# Patient Record
Sex: Female | Born: 1955 | Race: White | Hispanic: No | Smoking: Never smoker
Health system: Southern US, Community
[De-identification: ages and names within clinical notes are randomized; demographics above are authoritative.]

## PROBLEM LIST (undated history)

## (undated) DIAGNOSIS — L2089 Other atopic dermatitis: Secondary | ICD-10-CM

## (undated) DIAGNOSIS — A64 Unspecified sexually transmitted disease: Secondary | ICD-10-CM

## (undated) DIAGNOSIS — D126 Benign neoplasm of colon, unspecified: Secondary | ICD-10-CM

## (undated) DIAGNOSIS — T7840XA Allergy, unspecified, initial encounter: Secondary | ICD-10-CM

## (undated) DIAGNOSIS — E785 Hyperlipidemia, unspecified: Secondary | ICD-10-CM

## (undated) DIAGNOSIS — F32A Depression, unspecified: Secondary | ICD-10-CM

## (undated) DIAGNOSIS — R51 Headache: Secondary | ICD-10-CM

## (undated) HISTORY — PX: WISDOM TOOTH EXTRACTION: SHX21

## (undated) HISTORY — DX: Headache: R51

## (undated) HISTORY — DX: Hyperlipidemia, unspecified: E78.5

## (undated) HISTORY — DX: Other atopic dermatitis: L20.89

## (undated) HISTORY — DX: Benign neoplasm of colon, unspecified: D12.6

## (undated) HISTORY — DX: Unspecified sexually transmitted disease: A64

## (undated) HISTORY — DX: Depression, unspecified: F32.A

## (undated) HISTORY — DX: Allergy, unspecified, initial encounter: T78.40XA

## (undated) HISTORY — PX: EYE SURGERY: SHX253

---

## 1987-01-16 DIAGNOSIS — A64 Unspecified sexually transmitted disease: Secondary | ICD-10-CM

## 1987-01-16 HISTORY — DX: Unspecified sexually transmitted disease: A64

## 1997-06-24 DIAGNOSIS — D229 Melanocytic nevi, unspecified: Secondary | ICD-10-CM

## 1997-06-24 HISTORY — DX: Melanocytic nevi, unspecified: D22.9

## 1997-07-12 ENCOUNTER — Other Ambulatory Visit: Admission: RE | Admit: 1997-07-12 | Discharge: 1997-07-12 | Payer: Self-pay | Admitting: Obstetrics and Gynecology

## 1998-07-13 ENCOUNTER — Other Ambulatory Visit: Admission: RE | Admit: 1998-07-13 | Discharge: 1998-07-13 | Payer: Self-pay | Admitting: Obstetrics and Gynecology

## 1999-07-27 ENCOUNTER — Other Ambulatory Visit: Admission: RE | Admit: 1999-07-27 | Discharge: 1999-07-27 | Payer: Self-pay | Admitting: Obstetrics and Gynecology

## 2000-09-02 ENCOUNTER — Other Ambulatory Visit: Admission: RE | Admit: 2000-09-02 | Discharge: 2000-09-02 | Payer: Self-pay | Admitting: Obstetrics and Gynecology

## 2001-09-11 ENCOUNTER — Ambulatory Visit (HOSPITAL_COMMUNITY): Admission: RE | Admit: 2001-09-11 | Discharge: 2001-09-11 | Payer: Self-pay | Admitting: Obstetrics and Gynecology

## 2001-09-11 ENCOUNTER — Other Ambulatory Visit: Admission: RE | Admit: 2001-09-11 | Discharge: 2001-09-11 | Payer: Self-pay | Admitting: Obstetrics and Gynecology

## 2001-09-11 ENCOUNTER — Encounter: Payer: Self-pay | Admitting: Obstetrics and Gynecology

## 2002-09-18 ENCOUNTER — Ambulatory Visit (HOSPITAL_COMMUNITY): Admission: RE | Admit: 2002-09-18 | Discharge: 2002-09-18 | Payer: Self-pay | Admitting: Obstetrics and Gynecology

## 2002-09-18 ENCOUNTER — Encounter: Payer: Self-pay | Admitting: Obstetrics and Gynecology

## 2002-09-18 ENCOUNTER — Other Ambulatory Visit: Admission: RE | Admit: 2002-09-18 | Discharge: 2002-09-18 | Payer: Self-pay | Admitting: Obstetrics and Gynecology

## 2003-10-13 ENCOUNTER — Ambulatory Visit (HOSPITAL_COMMUNITY): Admission: RE | Admit: 2003-10-13 | Discharge: 2003-10-13 | Payer: Self-pay | Admitting: *Deleted

## 2003-10-13 ENCOUNTER — Other Ambulatory Visit: Admission: RE | Admit: 2003-10-13 | Discharge: 2003-10-13 | Payer: Self-pay | Admitting: Obstetrics and Gynecology

## 2004-10-25 ENCOUNTER — Ambulatory Visit (HOSPITAL_COMMUNITY): Admission: RE | Admit: 2004-10-25 | Discharge: 2004-10-25 | Payer: Self-pay | Admitting: Obstetrics and Gynecology

## 2004-10-25 ENCOUNTER — Other Ambulatory Visit: Admission: RE | Admit: 2004-10-25 | Discharge: 2004-10-25 | Payer: Self-pay | Admitting: Obstetrics and Gynecology

## 2005-11-06 ENCOUNTER — Ambulatory Visit (HOSPITAL_COMMUNITY): Admission: RE | Admit: 2005-11-06 | Discharge: 2005-11-06 | Payer: Self-pay | Admitting: Obstetrics and Gynecology

## 2005-11-06 ENCOUNTER — Other Ambulatory Visit: Admission: RE | Admit: 2005-11-06 | Discharge: 2005-11-06 | Payer: Self-pay | Admitting: Obstetrics & Gynecology

## 2006-11-11 ENCOUNTER — Other Ambulatory Visit: Admission: RE | Admit: 2006-11-11 | Discharge: 2006-11-11 | Payer: Self-pay | Admitting: Obstetrics and Gynecology

## 2006-11-11 ENCOUNTER — Ambulatory Visit (HOSPITAL_COMMUNITY): Admission: RE | Admit: 2006-11-11 | Discharge: 2006-11-11 | Payer: Self-pay | Admitting: Obstetrics & Gynecology

## 2007-11-13 ENCOUNTER — Ambulatory Visit (HOSPITAL_COMMUNITY): Admission: RE | Admit: 2007-11-13 | Discharge: 2007-11-13 | Payer: Self-pay | Admitting: Obstetrics and Gynecology

## 2007-11-13 ENCOUNTER — Other Ambulatory Visit: Admission: RE | Admit: 2007-11-13 | Discharge: 2007-11-13 | Payer: Self-pay | Admitting: Obstetrics and Gynecology

## 2007-11-26 ENCOUNTER — Encounter: Admission: RE | Admit: 2007-11-26 | Discharge: 2007-11-26 | Payer: Self-pay | Admitting: Obstetrics and Gynecology

## 2008-04-15 DIAGNOSIS — D126 Benign neoplasm of colon, unspecified: Secondary | ICD-10-CM

## 2008-04-15 HISTORY — DX: Benign neoplasm of colon, unspecified: D12.6

## 2008-11-15 ENCOUNTER — Ambulatory Visit (HOSPITAL_COMMUNITY): Admission: RE | Admit: 2008-11-15 | Discharge: 2008-11-15 | Payer: Self-pay | Admitting: Obstetrics and Gynecology

## 2008-12-15 LAB — HM COLONOSCOPY

## 2009-11-16 ENCOUNTER — Ambulatory Visit (HOSPITAL_COMMUNITY): Admission: RE | Admit: 2009-11-16 | Discharge: 2009-11-16 | Payer: Self-pay | Admitting: Obstetrics and Gynecology

## 2009-11-25 LAB — HM MAMMOGRAPHY

## 2009-11-25 LAB — HM PAP SMEAR

## 2010-02-05 ENCOUNTER — Encounter: Payer: Self-pay | Admitting: Obstetrics and Gynecology

## 2010-03-08 LAB — FECAL OCCULT BLOOD, GUAIAC: Fecal Occult Blood: NEGATIVE

## 2010-04-12 ENCOUNTER — Encounter: Payer: Self-pay | Admitting: Family Medicine

## 2010-04-12 DIAGNOSIS — L2089 Other atopic dermatitis: Secondary | ICD-10-CM | POA: Insufficient documentation

## 2010-04-12 DIAGNOSIS — R51 Headache: Secondary | ICD-10-CM | POA: Insufficient documentation

## 2010-04-12 DIAGNOSIS — E785 Hyperlipidemia, unspecified: Secondary | ICD-10-CM | POA: Insufficient documentation

## 2010-04-12 DIAGNOSIS — R519 Headache, unspecified: Secondary | ICD-10-CM | POA: Insufficient documentation

## 2010-04-12 DIAGNOSIS — D126 Benign neoplasm of colon, unspecified: Secondary | ICD-10-CM | POA: Insufficient documentation

## 2010-04-12 DIAGNOSIS — F3130 Bipolar disorder, current episode depressed, mild or moderate severity, unspecified: Secondary | ICD-10-CM | POA: Insufficient documentation

## 2010-10-10 ENCOUNTER — Other Ambulatory Visit: Payer: Self-pay | Admitting: Obstetrics and Gynecology

## 2010-10-10 DIAGNOSIS — Z1231 Encounter for screening mammogram for malignant neoplasm of breast: Secondary | ICD-10-CM

## 2010-11-20 ENCOUNTER — Ambulatory Visit (HOSPITAL_COMMUNITY)
Admission: RE | Admit: 2010-11-20 | Discharge: 2010-11-20 | Disposition: A | Discharge status: Home / Self Care | Payer: BC Managed Care – PPO | Source: Ambulatory Visit | Attending: Obstetrics and Gynecology | Admitting: Obstetrics and Gynecology

## 2010-11-20 DIAGNOSIS — Z1231 Encounter for screening mammogram for malignant neoplasm of breast: Secondary | ICD-10-CM

## 2011-10-23 ENCOUNTER — Other Ambulatory Visit: Payer: Self-pay | Admitting: Nurse Practitioner

## 2011-10-23 DIAGNOSIS — Z1231 Encounter for screening mammogram for malignant neoplasm of breast: Secondary | ICD-10-CM

## 2011-11-21 ENCOUNTER — Ambulatory Visit (HOSPITAL_COMMUNITY)
Admission: RE | Admit: 2011-11-21 | Discharge: 2011-11-21 | Disposition: A | Discharge status: Home / Self Care | Payer: BC Managed Care – PPO | Source: Ambulatory Visit | Attending: Nurse Practitioner | Admitting: Nurse Practitioner

## 2011-11-21 DIAGNOSIS — Z1231 Encounter for screening mammogram for malignant neoplasm of breast: Secondary | ICD-10-CM | POA: Insufficient documentation

## 2012-06-10 ENCOUNTER — Encounter: Payer: Self-pay | Admitting: *Deleted

## 2012-06-27 ENCOUNTER — Other Ambulatory Visit: Payer: Self-pay

## 2012-07-02 ENCOUNTER — Ambulatory Visit: Payer: Self-pay | Admitting: Family Medicine

## 2012-07-10 ENCOUNTER — Other Ambulatory Visit (INDEPENDENT_AMBULATORY_CARE_PROVIDER_SITE_OTHER): Payer: BC Managed Care – PPO

## 2012-07-10 DIAGNOSIS — Z79899 Other long term (current) drug therapy: Secondary | ICD-10-CM

## 2012-07-10 DIAGNOSIS — R5383 Other fatigue: Secondary | ICD-10-CM

## 2012-07-10 DIAGNOSIS — R5381 Other malaise: Secondary | ICD-10-CM

## 2012-07-10 DIAGNOSIS — I10 Essential (primary) hypertension: Secondary | ICD-10-CM

## 2012-07-10 DIAGNOSIS — E559 Vitamin D deficiency, unspecified: Secondary | ICD-10-CM

## 2012-07-10 DIAGNOSIS — E039 Hypothyroidism, unspecified: Secondary | ICD-10-CM

## 2012-07-10 DIAGNOSIS — E785 Hyperlipidemia, unspecified: Secondary | ICD-10-CM

## 2012-07-10 LAB — HEPATIC FUNCTION PANEL
ALT: 14 U/L (ref 0–35)
AST: 14 U/L (ref 0–37)
Albumin: 4.2 g/dL (ref 3.5–5.2)
Alkaline Phosphatase: 60 U/L (ref 39–117)
Bilirubin, Direct: 0.1 mg/dL (ref 0.0–0.3)
Indirect Bilirubin: 0.5 mg/dL (ref 0.0–0.9)
Total Bilirubin: 0.6 mg/dL (ref 0.3–1.2)
Total Protein: 6.7 g/dL (ref 6.0–8.3)

## 2012-07-10 LAB — THYROID PANEL WITH TSH
Free Thyroxine Index: 2.8 (ref 1.0–3.9)
T3 Uptake: 33.8 % (ref 22.5–37.0)
T4, Total: 8.3 ug/dL (ref 5.0–12.5)
TSH: 2.024 u[IU]/mL (ref 0.350–4.500)

## 2012-07-10 LAB — BASIC METABOLIC PANEL WITH GFR
BUN: 8 mg/dL (ref 6–23)
CO2: 25 mEq/L (ref 19–32)
Calcium: 10 mg/dL (ref 8.4–10.5)
Chloride: 108 mEq/L (ref 96–112)
Creat: 0.76 mg/dL (ref 0.50–1.10)
GFR, Est African American: >89 mL/min
GFR, Est Non African American: 89 mL/min
Potassium: 4.3 mEq/L (ref 3.5–5.3)

## 2012-07-10 LAB — POCT CBC
Granulocyte percent: 73.6 %G (ref 37–80)
HCT, POC: 35.5 % — AB (ref 37.7–47.9)
Hemoglobin: 12.7 g/dL (ref 12.2–16.2)
Lymph, poc: 1.3 (ref 0.6–3.4)
MCV: 86 fL (ref 80–97)
MPV: 6.9 fL (ref 0–99.8)
POC Granulocyte: 4 (ref 2–6.9)
Platelet Count, POC: 227 10*3/uL (ref 142–424)
RBC: 4.1 M/uL (ref 4.04–5.48)

## 2012-07-10 NOTE — Progress Notes (Unsigned)
Patient came in for labs only.

## 2012-07-11 LAB — NMR LIPOPROFILE WITH LIPIDS
Cholesterol, Total: 188 mg/dL (ref <200)
HDL Particle Number: 36.1 umol/L (ref >=30.5)
HDL Size: 9 nm — ABNORMAL LOW (ref >=9.2)
HDL-C: 53 mg/dL (ref >=40)
LDL (calc): 108 mg/dL — ABNORMAL HIGH (ref <100)
LDL Particle Number: 1500 nmol/L — ABNORMAL HIGH (ref <1000)
LDL Size: 20.2 nm — ABNORMAL LOW (ref >20.5)
LP-IR Score: 30 (ref <=45)
Large HDL-P: 6.8 umol/L (ref >=4.8)
Large VLDL-P: <0.8 nmol/L (ref <=2.7)
Small LDL Particle Number: 863 nmol/L — ABNORMAL HIGH (ref <=527)
Triglycerides: 137 mg/dL (ref <150)
VLDL Size: 42.8 nm (ref <=46.6)

## 2012-07-11 LAB — VITAMIN D 25 HYDROXY (VIT D DEFICIENCY, FRACTURES): Vit D, 25-Hydroxy: 33 ng/mL (ref 30–89)

## 2012-07-11 LAB — LITHIUM LEVEL: Lithium Lvl: 0.8 mEq/L (ref 0.80–1.40)

## 2012-07-15 ENCOUNTER — Ambulatory Visit (INDEPENDENT_AMBULATORY_CARE_PROVIDER_SITE_OTHER): Payer: BC Managed Care – PPO | Admitting: Family Medicine

## 2012-07-15 ENCOUNTER — Encounter: Payer: Self-pay | Admitting: Family Medicine

## 2012-07-15 VITALS — BP 104/65 | HR 76 | Temp 97.1°F | Ht 62.5 in | Wt 137.4 lb

## 2012-07-15 DIAGNOSIS — N390 Urinary tract infection, site not specified: Secondary | ICD-10-CM

## 2012-07-15 DIAGNOSIS — Z79899 Other long term (current) drug therapy: Secondary | ICD-10-CM

## 2012-07-15 DIAGNOSIS — R51 Headache: Secondary | ICD-10-CM

## 2012-07-15 DIAGNOSIS — E785 Hyperlipidemia, unspecified: Secondary | ICD-10-CM

## 2012-07-15 DIAGNOSIS — F3130 Bipolar disorder, current episode depressed, mild or moderate severity, unspecified: Secondary | ICD-10-CM

## 2012-07-15 LAB — POCT UA - MICROSCOPIC ONLY
Casts, Ur, LPF, POC: NEGATIVE
Crystals, Ur, HPF, POC: NEGATIVE
Mucus, UA: NEGATIVE
Yeast, UA: NEGATIVE

## 2012-07-15 LAB — POCT URINALYSIS DIPSTICK
Bilirubin, UA: NEGATIVE
Glucose, UA: NEGATIVE
Ketones, UA: NEGATIVE
Leukocytes, UA: NEGATIVE
Nitrite, UA: NEGATIVE
Spec Grav, UA: <=1.005
Urobilinogen, UA: NEGATIVE
pH, UA: 5

## 2012-07-15 MED ORDER — IBUPROFEN 800 MG PO TABS: 800.0000 mg | ORAL_TABLET | Freq: Three times a day (TID) | ORAL | Status: DC | PRN

## 2012-07-15 MED ORDER — LITHIUM CARBONATE 300 MG PO CAPS: 300.0000 mg | ORAL_CAPSULE | Freq: Three times a day (TID) | ORAL | Status: DC

## 2012-07-15 NOTE — Addendum Note (Signed)
Addended by: Ernestina Penna on: 07/15/2012 04:54 PM   Modules accepted: Orders

## 2012-07-15 NOTE — Addendum Note (Signed)
Addended by: Orma Render F on: 07/15/2012 05:15 PM   Modules accepted: Orders

## 2012-07-15 NOTE — Patient Instructions (Addendum)
Schedule appointment with clinical pharmacist Continue current meds and therapeutic lifestyle changes We will arrange an appointment with a clinical pharmacist to discuss better lipid control

## 2012-07-15 NOTE — Progress Notes (Signed)
  Subjective:    Patient ID: [name], female    DOB: 12-26-1955, 57 y.o.   MRN: [id number]  HPI Patient returns to clinic today for followup of multiple medical problems. These include hyperlipidemia bipolar disease and osteopenia. She still complains of some right shoulder pain with abduction of the shoulder. She is doing well with her bipolar disease. Labs were reviewed with her the only thing that was outlined was her total LDL particle number was elevated. 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   Review of Systems  Constitutional: Negative.   HENT: Positive for postnasal drip (intermitent). Negative for congestion.   Eyes: Negative.   Respiratory: Negative.   Cardiovascular: Negative.   Gastrointestinal: Positive for constipation (occasional).  Endocrine: Negative.   Genitourinary: Positive for urgency (occasional) and frequency (at night).  Musculoskeletal: Positive for arthralgias (r shoulder stiffness).  Skin: Negative.   Allergic/Immunologic: Positive for environmental allergies (seasonal).  Neurological: Negative.   Psychiatric/Behavioral: Positive for sleep disturbance (occasional).   Recent labs reviewed with patient. Lithium level was good. Cholesterol was elevated.     Objective:   Physical Exam BP 104/65  Pulse 76  Temp(Src) 97.1 F (36.2 C) (Oral)  Ht 5' 2.5" (1.588 m)  Wt 137 lb 6.4 oz (62.324 kg)  BMI 24.71 kg/m2  The patient appeared well nourished and normally developed, alert and oriented to time and place. Speech, behavior and judgement appear normal. Vital signs as documented.  Head exam is unremarkable. No scleral icterus or pallor noted. Ears nose and throat all within normal limits. Neck is without jugular venous distension, thyromegally, or carotid bruits. Carotid upstrokes are brisk bilaterally. No cervical adenopathy. Lungs are clear anteriorly and posteriorly to auscultation. Normal respiratory  effort. Cardiac exam reveals regular rate and rhythm at 72 per minute. First and second heart sounds normal.  No murmurs, rubs or gallops.  Abdominal exam reveals normal bowl sounds, no masses, no organomegaly and no aortic enlargement. No inguinal adenopathy. Extremities are nonedematous and both femoral and pedal pulses are normal. Skin without pallor or jaundice.  Warm and dry, without rash. Facial erythema on the left cheek. Neurologic exam reveals normal deep tendon reflexes and normal sensation.          Assessment & Plan:  1. Hyperlipidemia  2. Bipolar I disorder, most recent episode (or current) depressed  3. High risk medication use  Patient Instructions  Schedule appointment with clinical pharmacist Continue current meds and therapeutic lifestyle changes We will arrange an appointment with a clinical pharmacist to discuss better lipid control   Nyra Capes MD

## 2012-07-17 ENCOUNTER — Telehealth: Payer: Self-pay | Admitting: *Deleted

## 2012-07-17 NOTE — Telephone Encounter (Signed)
Message copied by Bearl Mulberry on Thu Jul 17, 2012  5:32 PM ------      Message from: Ernestina Penna      Created: Tue Jul 15, 2012  6:21 PM       The urinalysis only has 1-5 WBCs and this is within normal limits.      Drink plenty of fluids ------

## 2012-07-17 NOTE — Telephone Encounter (Signed)
Pt notified of results

## 2012-08-12 ENCOUNTER — Ambulatory Visit (INDEPENDENT_AMBULATORY_CARE_PROVIDER_SITE_OTHER): Payer: BC Managed Care – PPO | Admitting: Pharmacist Clinician (PhC)/ Clinical Pharmacy Specialist

## 2012-08-12 DIAGNOSIS — E785 Hyperlipidemia, unspecified: Secondary | ICD-10-CM

## 2012-08-12 NOTE — Progress Notes (Signed)
  Subjective:    Patient ID: [name], female    DOB: 05/05/1955, 58 y.o.   MRN: [id number]  HPI:  Newly Elevated LDL-P level with otherwise normal lipids.  Father had a triple bypass at age 88 and has elevated cholesterol levels and borderline diabetes.  Mother has no history of CAD.      Review of Systems  Constitutional: Negative.   Respiratory: Negative.   Cardiovascular: Negative.   Musculoskeletal: Negative.   Neurological: Negative.   Psychiatric/Behavioral: Negative.        Objective:   Physical Exam  Constitutional: She is oriented to person, place, and time. She appears well-developed and well-nourished.  Cardiovascular: Normal rate and regular rhythm.   Neurological: She is alert and oriented to person, place, and time.  Psychiatric: She has a normal mood and affect. Her behavior is normal. Judgment and thought content normal.          Assessment & Plan:   Lipid Clinic Consultation  Chief Complaint:  No chief complaint on file.    Exam Regularity:  RRR Edema:  neg Respirations:  20   Carotid Bruits:  neg Xanthomas:  neg General Appearance:  alert, oriented, no acute distress Mood/Affect:  normal  HPI:  Elevated LDL-P new onset with otherwise normal lipids and no treatments for elevated cholesterol past of present.       Component Value Date/Time   TRIG 137 07/10/2012 0842    Assessment: CHD/CHF Risk Equivalents:  family history and age Framingham Estd 5yrs risk:   NCEP Risk Factors Present:  family history and age Primary Problem(s):  LDL or LDL-P elevated  Current NCEP Goals: LDL Goal < 130 HDL Goal >/= 40 Tg Goal < 782 Non-HDL Goal < 160  Secondary cause of hyperlipidemia present:  None present Low fat diet followed?  No - diet high in saturated fat and processed foods Low carb diet followed?  No -   Exercise?  No - walks dog leisurely 15 minutes a day  Recommendations: Changes in lipid medication(s):  Counseled patient extensively  on dietary changes to make for a heart healthy diet.  She will start walking daily at a brisk pace 30-45 minutes a day is her goal. Recheck Lipid Panel:  3 months Other labs needed:  TSH and blood glucose  Time spent counseling patient:  39  Physician time spent with patient:  0 Referring Provider:  Christell Constant   PharmD:  Chari Manning, [facility]

## 2012-10-30 ENCOUNTER — Other Ambulatory Visit: Payer: Self-pay | Admitting: Nurse Practitioner

## 2012-10-30 DIAGNOSIS — Z1231 Encounter for screening mammogram for malignant neoplasm of breast: Secondary | ICD-10-CM

## 2012-11-20 ENCOUNTER — Encounter: Payer: Self-pay | Admitting: Nurse Practitioner

## 2012-11-21 ENCOUNTER — Ambulatory Visit (INDEPENDENT_AMBULATORY_CARE_PROVIDER_SITE_OTHER): Payer: BC Managed Care – PPO | Admitting: Nurse Practitioner

## 2012-11-21 ENCOUNTER — Ambulatory Visit (HOSPITAL_COMMUNITY)
Admission: RE | Admit: 2012-11-21 | Discharge: 2012-11-21 | Disposition: A | Discharge status: Home / Self Care | Payer: BC Managed Care – PPO | Source: Ambulatory Visit | Attending: Nurse Practitioner | Admitting: Nurse Practitioner

## 2012-11-21 ENCOUNTER — Encounter: Payer: Self-pay | Admitting: Nurse Practitioner

## 2012-11-21 VITALS — BP 120/72 | HR 68 | Resp 16 | Ht 63.0 in | Wt 134.0 lb

## 2012-11-21 DIAGNOSIS — Z0000 Encounter for general adult medical examination without abnormal findings: Secondary | ICD-10-CM

## 2012-11-21 DIAGNOSIS — Z1231 Encounter for screening mammogram for malignant neoplasm of breast: Secondary | ICD-10-CM

## 2012-11-21 DIAGNOSIS — Z01419 Encounter for gynecological examination (general) (routine) without abnormal findings: Secondary | ICD-10-CM

## 2012-11-21 LAB — POCT URINALYSIS DIPSTICK
Bilirubin, UA: NEGATIVE
Blood, UA: NEGATIVE
Glucose, UA: NEGATIVE
Ketones, UA: NEGATIVE
Leukocytes, UA: NEGATIVE
Nitrite, UA: NEGATIVE
Protein, UA: NEGATIVE
Urobilinogen, UA: NEGATIVE
pH, UA: 5

## 2012-11-21 MED ORDER — VALACYCLOVIR HCL 1 G PO TABS: 1000.0000 mg | ORAL_TABLET | Freq: Every day | ORAL | Status: DC

## 2012-11-21 NOTE — Progress Notes (Signed)
Patient ID: [name], female   DOB: Jun 30, 1955, 57 y.o.   MRN: [id number] 57 y.o. [id number] Widowed Caucasian Fe here for annual exam.    No LMP recorded. Patient is postmenopausal.          Sexually active: no  The current method of family planning is none.    Exercising: yes  Home exercise routine includes walking 3 times per week. Smoker:  no  Health Maintenance: Pap: 11/21/11, WNL, neg HR HPV MMG: 11/22/11, Bi-Rads 1: negative Colonoscopy: 04/2008, tubular adenoma, repeat 5 years BMD: 10/10/11, osteopenia TDaP: 2012 Labs: PCP recent Vit D at 33 done in June 2014   reports that she has never smoked. She does not have any smokeless tobacco history on file. She reports that she drinks alcohol. She reports that she does not use illicit drugs.  Past Medical History  Diagnosis Date  . Bipolar I disorder, most recent episode (or current) depressed   . Benign neoplasm of colon 04/2008    tubular adenoma  . Headache(784.0)   . Other atopic dermatitis and related conditions   . Other and unspecified hyperlipidemia     Past Surgical History  Procedure Laterality Date  . Cesarean section      x 2    Current Outpatient Prescriptions  Medication Sig Dispense Refill  . CALCIUM PO Take by mouth.      . Cholecalciferol (VITAMIN D) 2000 UNITS CAPS Take by mouth.      Marland Kitchen ibuprofen (ADVIL,MOTRIN) 800 MG tablet Take 1 tablet (800 mg total) by mouth every 8 (eight) hours as needed.  30 tablet  5  . lithium carbonate 300 MG capsule Take 1 capsule (300 mg total) by mouth 3 (three) times daily with meals.  90 capsule  5  . valACYclovir (VALTREX) 1000 MG tablet Take 500 mg by mouth daily.        No current facility-administered medications for this visit.    Family History  Problem Relation Age of Onset  . Colon cancer Maternal Grandmother 60  . Heart disease Father   . Hypertension Father   . Thyroid disease Mother     ROS:  Pertinent items are noted in HPI.  Otherwise, a comprehensive  ROS was negative.  Exam:   BP 120/72  Pulse 68  Resp 16  Ht 5\' 3"  (1.6 m)  Wt 134 lb (60.782 kg)  BMI 23.74 kg/m2 Height: 5\' 3"  (160 cm)  Ht Readings from Last 3 Encounters:  11/21/12 5\' 3"  (1.6 m)  07/15/12 5' 2.5" (1.588 m)    General appearance: alert, cooperative and appears stated age Head: Normocephalic, without obvious abnormality, atraumatic Neck: no adenopathy, supple, symmetrical, trachea midline and thyroid normal to inspection and palpation Lungs: clear to auscultation bilaterally Breasts: normal appearance, no masses or tenderness Heart: regular rate and rhythm Abdomen: soft, non-tender; no masses,  no organomegaly Extremities: extremities normal, atraumatic, no cyanosis or edema Skin: Skin color, texture, turgor normal. No rashes or lesions Lymph nodes: Cervical, supraclavicular, and axillary nodes normal. No abnormal inguinal nodes palpated Neurologic: Grossly normal   Pelvic: External genitalia:  no lesions              Urethra:  normal appearing urethra with no masses, tenderness or lesions              Bartholin's and Skene's: normal                 Vagina: normal appearing vagina with normal color  and discharge, no lesions              Cervix: anteverted              Pap taken: no Bimanual Exam:  Uterus:  normal size, contour, position, consistency, mobility, non-tender              Adnexa: no mass, fullness, tenderness               Rectovaginal: Confirms               Anus:  normal sphincter tone, no lesions  A:  Well Woman with normal exam  Postmenopausal no HRT  Osteopenia  History of HSV 1989  FMH: osteoporosis with mother  P:   Pap smear as per guidelines   Mammogram due now and will schedule  Refill Valtrex for a year  Counseled on breast self exam, adequate intake of calcium and vitamin D, diet and exercise return annually or prn  An After Visit Summary was printed and given to the patient.

## 2012-11-21 NOTE — Patient Instructions (Signed)

## 2012-11-23 NOTE — Progress Notes (Signed)
Encounter reviewed by Dr. Brook Silva.  

## 2013-01-19 ENCOUNTER — Ambulatory Visit: Payer: BC Managed Care – PPO | Admitting: Family Medicine

## 2013-02-27 ENCOUNTER — Other Ambulatory Visit: Payer: Self-pay | Admitting: Family Medicine

## 2013-03-02 NOTE — Telephone Encounter (Signed)
Last seen 07/15/12  DWM

## 2013-03-23 ENCOUNTER — Other Ambulatory Visit: Payer: Self-pay | Admitting: *Deleted

## 2013-03-23 ENCOUNTER — Ambulatory Visit (INDEPENDENT_AMBULATORY_CARE_PROVIDER_SITE_OTHER): Payer: BC Managed Care – PPO | Admitting: Family Medicine

## 2013-03-23 ENCOUNTER — Encounter: Payer: Self-pay | Admitting: Family Medicine

## 2013-03-23 ENCOUNTER — Ambulatory Visit (INDEPENDENT_AMBULATORY_CARE_PROVIDER_SITE_OTHER): Payer: BC Managed Care – PPO

## 2013-03-23 VITALS — BP 110/72 | HR 65 | Temp 98.6°F | Ht 63.0 in | Wt 138.0 lb

## 2013-03-23 DIAGNOSIS — E785 Hyperlipidemia, unspecified: Secondary | ICD-10-CM

## 2013-03-23 DIAGNOSIS — Z0000 Encounter for general adult medical examination without abnormal findings: Secondary | ICD-10-CM

## 2013-03-23 DIAGNOSIS — M19019 Primary osteoarthritis, unspecified shoulder: Secondary | ICD-10-CM

## 2013-03-23 DIAGNOSIS — R51 Headache: Secondary | ICD-10-CM

## 2013-03-23 DIAGNOSIS — F3130 Bipolar disorder, current episode depressed, mild or moderate severity, unspecified: Secondary | ICD-10-CM

## 2013-03-23 DIAGNOSIS — E559 Vitamin D deficiency, unspecified: Secondary | ICD-10-CM

## 2013-03-23 LAB — POCT CBC
Granulocyte percent: 64.5 %G (ref 37–80)
HCT, POC: 39.3 % (ref 37.7–47.9)
Hemoglobin: 12.6 g/dL (ref 12.2–16.2)
LYMPH, POC: 1.7 (ref 0.6–3.4)
MCH, POC: 27.6 pg (ref 27–31.2)
MCHC: 32.1 g/dL (ref 31.8–35.4)
MCV: 85.9 fL (ref 80–97)
MPV: 6.2 fL (ref 0–99.8)
PLATELET COUNT, POC: 270 10*3/uL (ref 142–424)
POC Granulocyte: 3.8 (ref 2–6.9)
POC LYMPH %: 29.4 % (ref 10–50)
RBC: 4.6 M/uL (ref 4.04–5.48)
RDW, POC: 14 %
WBC: 5.9 10*3/uL (ref 4.6–10.2)

## 2013-03-23 MED ORDER — LITHIUM CARBONATE 300 MG PO CAPS: ORAL_CAPSULE | ORAL | Status: DC

## 2013-03-23 MED ORDER — IBUPROFEN 800 MG PO TABS: 800.0000 mg | ORAL_TABLET | Freq: Three times a day (TID) | ORAL | Status: DC | PRN

## 2013-03-23 NOTE — Progress Notes (Signed)
Subjective:    Patient ID: [name], female    DOB: 1955/08/29, 58 y.o.   MRN: [id number]  HPI Pt here for follow up and management of chronic medical problems. The patient comes because of her bipolar disease for which we prescribed the lithium and she also has continued problems with her right a.c. joint area. She had x-rays of this in the past and this revealed degenerative changes of the right a.c. joint. She is due to get a chest x-ray and an FOBT today. She will get her basic lab work today and will return to clinic for a lithium level. She does request that her ibuprofen  refill.        Patient Active Problem List   Diagnosis Date Noted  . Bipolar I disorder, most recent episode (or current) depressed   . Benign neoplasm of colon   . Headache   . Other atopic dermatitis and related conditions   . Hyperlipidemia    Outpatient Encounter Prescriptions as of 03/23/2013  Medication Sig  . CALCIUM PO Take by mouth.  . Cholecalciferol (VITAMIN D) 2000 UNITS CAPS Take by mouth.  Marland Kitchen ibuprofen (ADVIL,MOTRIN) 800 MG tablet Take 1 tablet (800 mg total) by mouth every 8 (eight) hours as needed.  . lithium carbonate 300 MG capsule TAKE ONE CAPSULE BY MOUTH THREE TIMES DAILY WITH MEALS  . valACYclovir (VALTREX) 1000 MG tablet Take 1 tablet (1,000 mg total) by mouth daily.    Review of Systems  Constitutional: Negative.   HENT: Positive for ear pain (right - feels "stopped up") and sinus pressure (worse in the mornings).   Eyes: Negative.   Respiratory: Negative.   Cardiovascular: Negative.   Gastrointestinal: Negative.   Endocrine: Negative.   Genitourinary: Negative.   Musculoskeletal: Positive for arthralgias (right shoulder and right knee pain at times).  Skin: Negative.   Allergic/Immunologic: Negative.   Neurological: Negative.   Hematological: Negative.   Psychiatric/Behavioral: Negative.        Objective:   Physical Exam  Nursing note and vitals  reviewed. Constitutional: She is oriented to person, place, and time. She appears well-developed and well-nourished. No distress.  Calm And pleasant  HENT:  Head: Normocephalic and atraumatic.  Right Ear: External ear normal.  Left Ear: External ear normal.  Nose: Nose normal.  Mouth/Throat: Oropharynx is clear and moist.  Eyes: Conjunctivae and EOM are normal. Pupils are equal, round, and reactive to light. Right eye exhibits no discharge. Left eye exhibits no discharge. No scleral icterus.  Neck: Normal range of motion. Neck supple. No thyromegaly present.  Cardiovascular: Normal rate, regular rhythm, normal heart sounds and intact distal pulses.  Exam reveals no gallop and no friction rub.   No murmur heard. At 72 per minute  Pulmonary/Chest: Effort normal and breath sounds normal. No respiratory distress. She has no wheezes. She has no rales.  Abdominal: Soft. Bowel sounds are normal. She exhibits no mass. There is no tenderness. There is no rebound and no guarding.  Musculoskeletal: Normal range of motion. She exhibits no edema and no tenderness.  Lymphadenopathy:    She has no cervical adenopathy.  Neurological: She is alert and oriented to person, place, and time. She has normal reflexes.  Skin: Skin is warm and dry.  Psychiatric: She has a normal mood and affect. Her behavior is normal. Judgment and thought content normal.  Moods are stable with lithium   BP 110/72  Pulse 65  Temp(Src) 98.6 F (37 C) (Oral)  Ht 5' 3"  (1.6 m)  Wt 138 lb (62.596 kg)  BMI 24.45 kg/m2  [facility] reading (PRIMARY) by  Dr. Laurance Flatten-  no active disease                                      Assessment & Plan:   1. Hyperlipidemia - Hepatic function panel - Lipid panel - DG Chest 2 View; Future  2. Healthcare maintenance - POCT CBC - BMP8+EGFR - Hepatic function panel - Lipid panel - Vit D  25 hydroxy (rtn osteoporosis monitoring) - DG Chest 2 View; Future  3. Vitamin D deficiency - Vit D   25 hydroxy (rtn osteoporosis monitoring)  4. Headache(784.0) - ibuprofen (ADVIL,MOTRIN) 800 MG tablet; Take 1 tablet (800 mg total) by mouth every 8 (eight) hours as needed.  Dispense: 30 tablet; Refill: 5  5. Acromioclavicular joint arthritis - ibuprofen (ADVIL,MOTRIN) 800 MG tablet; Take 1 tablet (800 mg total) by mouth every 8 (eight) hours as needed.  Dispense: 30 tablet; Refill: 5  6. Bipolar I disorder, most recent episode (or current) depressed  Patient Instructions  Continue current medications. Continue good therapeutic lifestyle changes which include good diet and exercise. Fall precautions discussed with patient. If an FOBT was given today- please return it to our front desk. If you are over 44 years old - you may need Prevnar 53 or the adult Pneumonia vaccine.  Take ibuprofen 800 twice daily for 7-10 days after eating Use warm wet compresses to right a.c. joint 20 minutes 3 or 4 times daily Return to clinic during spring break for lithium level. Be sure to check with her insurance regarding the Prevnar vaccine   Arrie Senate MD

## 2013-03-23 NOTE — Patient Instructions (Addendum)
Continue current medications. Continue good therapeutic lifestyle changes which include good diet and exercise. Fall precautions discussed with patient. If an FOBT was given today- please return it to our front desk. If you are over 56 years old - you may need Prevnar 61 or the adult Pneumonia vaccine.  Take ibuprofen 800 twice daily for 7-10 days after eating Use warm wet compresses to right a.c. joint 20 minutes 3 or 4 times daily Return to clinic during spring break for lithium level. Be sure to check with her insurance regarding the Prevnar vaccine

## 2013-03-24 LAB — HEPATIC FUNCTION PANEL
ALBUMIN: 4.5 g/dL (ref 3.5–5.5)
ALT: 10 IU/L (ref 0–32)
AST: 12 IU/L (ref 0–40)
Alkaline Phosphatase: 65 IU/L (ref 39–117)
BILIRUBIN TOTAL: 0.4 mg/dL (ref 0.0–1.2)
Bilirubin, Direct: 0.13 mg/dL (ref 0.00–0.40)
TOTAL PROTEIN: 6.5 g/dL (ref 6.0–8.5)

## 2013-03-24 LAB — LIPID PANEL
Chol/HDL Ratio: 3.6 ratio units (ref 0.0–4.4)
Cholesterol, Total: 192 mg/dL (ref 100–199)
HDL: 54 mg/dL (ref >39)
LDL Calculated: 110 mg/dL — ABNORMAL HIGH (ref 0–99)
Triglycerides: 138 mg/dL (ref 0–149)
VLDL CHOLESTEROL CAL: 28 mg/dL (ref 5–40)

## 2013-03-24 LAB — BMP8+EGFR
BUN/Creatinine Ratio: 15 (ref 9–23)
BUN: 10 mg/dL (ref 6–24)
CALCIUM: 9.7 mg/dL (ref 8.7–10.2)
CO2: 22 mmol/L (ref 18–29)
CREATININE: 0.68 mg/dL (ref 0.57–1.00)
Chloride: 104 mmol/L (ref 97–108)
GFR calc Af Amer: 114 mL/min/{1.73_m2} (ref >59)
GFR calc non Af Amer: 99 mL/min/{1.73_m2} (ref >59)
GLUCOSE: 88 mg/dL (ref 65–99)
Potassium: 4.1 mmol/L (ref 3.5–5.2)
Sodium: 142 mmol/L (ref 134–144)

## 2013-03-24 LAB — VITAMIN D 25 HYDROXY (VIT D DEFICIENCY, FRACTURES): VIT D 25 HYDROXY: 24.7 ng/mL — AB (ref 30.0–100.0)

## 2013-03-27 ENCOUNTER — Other Ambulatory Visit: Payer: BC Managed Care – PPO

## 2013-03-27 DIAGNOSIS — Z1212 Encounter for screening for malignant neoplasm of rectum: Secondary | ICD-10-CM

## 2013-03-27 NOTE — Progress Notes (Signed)
Pt came in for labs only 

## 2013-03-28 LAB — FECAL OCCULT BLOOD, IMMUNOCHEMICAL: Fecal Occult Bld: NEGATIVE

## 2013-04-01 ENCOUNTER — Other Ambulatory Visit: Payer: Self-pay | Admitting: *Deleted

## 2013-04-01 MED ORDER — VITAMIN D (ERGOCALCIFEROL) 1.25 MG (50000 UNIT) PO CAPS: 50000.0000 [IU] | ORAL_CAPSULE | ORAL | Status: DC

## 2013-04-09 ENCOUNTER — Encounter: Payer: Self-pay | Admitting: *Deleted

## 2013-04-09 NOTE — Progress Notes (Signed)
Quick Note:  Copy of labs sent to patient ______ 

## 2013-04-16 ENCOUNTER — Other Ambulatory Visit (INDEPENDENT_AMBULATORY_CARE_PROVIDER_SITE_OTHER): Payer: BC Managed Care – PPO

## 2013-04-16 DIAGNOSIS — F3130 Bipolar disorder, current episode depressed, mild or moderate severity, unspecified: Secondary | ICD-10-CM

## 2013-04-16 NOTE — Progress Notes (Signed)
Patient came in for labs only.

## 2013-04-17 LAB — LITHIUM LEVEL: LITHIUM LVL: 1 mmol/L (ref 0.6–1.4)

## 2013-04-20 ENCOUNTER — Telehealth: Payer: Self-pay | Admitting: Family Medicine

## 2013-04-20 NOTE — Telephone Encounter (Signed)
Message copied by Waverly Ferrari on Mon Apr 20, 2013  3:37 PM ------      Message from: Chipper Herb      Created: Fri Apr 17, 2013  9:01 AM       The. lithium level was within normal limits ------

## 2013-04-20 NOTE — Telephone Encounter (Signed)
Pt aware of lab result.  rs °

## 2013-07-29 ENCOUNTER — Encounter: Payer: Self-pay | Admitting: *Deleted

## 2013-08-11 ENCOUNTER — Ambulatory Visit: Payer: BC Managed Care – PPO | Admitting: Family Medicine

## 2013-08-13 ENCOUNTER — Encounter: Payer: Self-pay | Admitting: Family Medicine

## 2013-08-13 ENCOUNTER — Ambulatory Visit (INDEPENDENT_AMBULATORY_CARE_PROVIDER_SITE_OTHER): Payer: BC Managed Care – PPO | Admitting: Family Medicine

## 2013-08-13 VITALS — BP 112/74 | HR 74 | Temp 98.5°F | Ht 63.0 in | Wt 135.0 lb

## 2013-08-13 DIAGNOSIS — Z23 Encounter for immunization: Secondary | ICD-10-CM

## 2013-08-13 DIAGNOSIS — R350 Frequency of micturition: Secondary | ICD-10-CM

## 2013-08-13 DIAGNOSIS — M25519 Pain in unspecified shoulder: Secondary | ICD-10-CM

## 2013-08-13 DIAGNOSIS — F3130 Bipolar disorder, current episode depressed, mild or moderate severity, unspecified: Secondary | ICD-10-CM

## 2013-08-13 DIAGNOSIS — Z13820 Encounter for screening for osteoporosis: Secondary | ICD-10-CM

## 2013-08-13 DIAGNOSIS — M25511 Pain in right shoulder: Secondary | ICD-10-CM

## 2013-08-13 DIAGNOSIS — G4700 Insomnia, unspecified: Secondary | ICD-10-CM

## 2013-08-13 DIAGNOSIS — E559 Vitamin D deficiency, unspecified: Secondary | ICD-10-CM

## 2013-08-13 DIAGNOSIS — E785 Hyperlipidemia, unspecified: Secondary | ICD-10-CM

## 2013-08-13 LAB — POCT UA - MICROSCOPIC ONLY
Casts, Ur, LPF, POC: NEGATIVE
Crystals, Ur, HPF, POC: NEGATIVE
Mucus, UA: NEGATIVE
YEAST UA: NEGATIVE

## 2013-08-13 LAB — POCT URINALYSIS DIPSTICK
BILIRUBIN UA: NEGATIVE
Glucose, UA: NEGATIVE
KETONES UA: NEGATIVE
NITRITE UA: NEGATIVE
Spec Grav, UA: <=1.005
Urobilinogen, UA: NEGATIVE
pH, UA: 8.5

## 2013-08-13 MED ORDER — CIPROFLOXACIN HCL 500 MG PO TABS: 500.0000 mg | ORAL_TABLET | Freq: Two times a day (BID) | ORAL | Status: DC

## 2013-08-13 MED ORDER — MELOXICAM 15 MG PO TABS: ORAL_TABLET | ORAL | Status: DC

## 2013-08-13 NOTE — Progress Notes (Signed)
Subjective:    Patient ID: [name], female    DOB: 12-01-1955, 58 y.o.   MRN: [id number]  HPI Pt here for follow up and management of chronic medical problems. The patient complains today of some right arm soreness. The discomfort is primarily at the right a.c. joint. She had x-rays a couple of years ago which indicated mild degenerative changes. The patient also complains of some urinary frequency and occasionally problems with going to bed and getting to sleep at nighttime. The patient indicates that her bipolar disorder is well controlled. She also indicates that she had a colonoscopy and one polyp was found earlier in the month. She is scheduled for a repeat colonoscopy in 5 years. She is due to get her Prevnar vaccine today and she will be scheduled for a DEXA scan. She had a recent eye exam that was good.        Patient Active Problem List   Diagnosis Date Noted  . Vitamin D deficiency 03/23/2013  . Bipolar I disorder, most recent episode (or current) depressed   . Benign neoplasm of colon   . Headache   . Other atopic dermatitis and related conditions   . Hyperlipidemia    Outpatient Encounter Prescriptions as of 08/13/2013  Medication Sig  . CALCIUM PO Take by mouth.  . Cholecalciferol (VITAMIN D) 2000 UNITS CAPS Take by mouth.  Marland Kitchen ibuprofen (ADVIL,MOTRIN) 800 MG tablet Take 1 tablet (800 mg total) by mouth every 8 (eight) hours as needed.  . lithium carbonate 300 MG capsule TAKE ONE CAPSULE BY MOUTH THREE TIMES DAILY WITH MEALS  . valACYclovir (VALTREX) 1000 MG tablet Take 1 tablet (1,000 mg total) by mouth daily.  . [DISCONTINUED] Vitamin D, Ergocalciferol, (DRISDOL) 50000 UNITS CAPS capsule Take 1 capsule (50,000 Units total) by mouth every 7 (seven) days.    Review of Systems  Constitutional: Negative.   HENT: Negative.   Eyes: Negative.   Respiratory: Negative.   Cardiovascular: Negative.   Gastrointestinal: Negative.   Endocrine: Negative.   Genitourinary:  Negative.   Musculoskeletal: Positive for arthralgias (right arm soreness).  Skin: Negative.   Allergic/Immunologic: Negative.   Neurological: Negative.   Hematological: Negative.   Psychiatric/Behavioral: Negative.        Objective:   Physical Exam  Nursing note and vitals reviewed. Constitutional: She is oriented to person, place, and time. She appears well-developed and well-nourished. No distress.  HENT:  Head: Normocephalic and atraumatic.  Right Ear: External ear normal.  Left Ear: External ear normal.  Mouth/Throat: Oropharynx is clear and moist.  Nasal congestion  Eyes: Conjunctivae and EOM are normal. Pupils are equal, round, and reactive to light. Right eye exhibits no discharge. Left eye exhibits no discharge. No scleral icterus.  Neck: Normal range of motion. Neck supple. No JVD present. No thyromegaly present.  Cardiovascular: Normal rate, regular rhythm, normal heart sounds and intact distal pulses.   No murmur heard. Regular rate and rhythm at 72 per minute  Pulmonary/Chest: Effort normal and breath sounds normal. No respiratory distress. She has no wheezes. She has no rales. She exhibits no tenderness.  Abdominal: Soft. Bowel sounds are normal. She exhibits no mass. There is no tenderness. There is no rebound and no guarding.  Note epigastric or suprapubic tenderness  Musculoskeletal: Normal range of motion. She exhibits no edema and no tenderness.  Lymphadenopathy:    She has no cervical adenopathy.  Neurological: She is alert and oriented to person, place, and time. She has  normal reflexes. No cranial nerve deficit.  Skin: Skin is warm and dry. No rash noted.  Psychiatric: She has a normal mood and affect. Her behavior is normal. Judgment and thought content normal.   BP 112/74  Pulse 74  Temp(Src) 98.5 F (36.9 C) (Oral)  Ht 5' 3"  (1.6 m)  Wt 135 lb (61.236 kg)  BMI 23.92 kg/m2          Assessment & Plan:  1. Bipolar I disorder, most recent episode  (or current) depressed - POCT CBC - BMP8+EGFR - Hepatic function panel - Lithium level  2. Hyperlipidemia - POCT CBC - Hepatic function panel - NMR, lipoprofile  3. Vitamin D deficiency - POCT CBC - Vit D  25 hydroxy (rtn osteoporosis monitoring)  4. Urine frequency - POCT UA - Microscopic Only - POCT urinalysis dipstick  5. Screening for osteoporosis - DG Bone Density; Future  6. Right shoulder pain - meloxicam (MOBIC) 15 MG tablet; One half pill daily as directed after eating  Dispense: 30 tablet; Refill: 0  7. Insomnia  Patient Instructions  Continue current medications. Continue good therapeutic lifestyle changes which include good diet and exercise. Fall precautions discussed with patient. If an FOBT was given today- please return it to our front desk. If you are over 51 years old - you may need Prevnar 66 or the adult Pneumonia vaccine.   The Prevnar vaccine that you received today made make your arm sore We will call you with the results of your lab work once those results are available You will be scheduled for a DEXA scan    Arrie Senate MD

## 2013-08-13 NOTE — Patient Instructions (Addendum)
Continue current medications. Continue good therapeutic lifestyle changes which include good diet and exercise. Fall precautions discussed with patient. If an FOBT was given today- please return it to our front desk. If you are over 56 years old - you may need Prevnar 104 or the adult Pneumonia vaccine.   The Prevnar vaccine that you received today made make your arm sore We will call you with the results of your lab work once those results are available You will be scheduled for a DEXA scan

## 2013-08-14 LAB — VITAMIN D 25 HYDROXY (VIT D DEFICIENCY, FRACTURES): Vit D, 25-Hydroxy: 47.2 ng/mL (ref 30.0–100.0)

## 2013-08-14 LAB — NMR, LIPOPROFILE
CHOLESTEROL: 187 mg/dL (ref 100–199)
HDL CHOLESTEROL BY NMR: 50 mg/dL (ref >39)
HDL Particle Number: 31.8 umol/L (ref >=30.5)
LDL PARTICLE NUMBER: 1302 nmol/L — AB (ref <1000)
LDL SIZE: 20.5 nm (ref >20.5)
LDLC SERPL CALC-MCNC: 106 mg/dL — ABNORMAL HIGH (ref 0–99)
LP-IR Score: 27 (ref <=45)
Small LDL Particle Number: 711 nmol/L — ABNORMAL HIGH (ref <=527)
Triglycerides by NMR: 153 mg/dL — ABNORMAL HIGH (ref 0–149)

## 2013-08-14 LAB — BMP8+EGFR
BUN/Creatinine Ratio: 12 (ref 9–23)
BUN: 9 mg/dL (ref 6–24)
CO2: 24 mmol/L (ref 18–29)
CREATININE: 0.76 mg/dL (ref 0.57–1.00)
Calcium: 10.1 mg/dL (ref 8.7–10.2)
Chloride: 103 mmol/L (ref 97–108)
GFR calc Af Amer: 101 mL/min/{1.73_m2} (ref >59)
GFR, EST NON AFRICAN AMERICAN: 88 mL/min/{1.73_m2} (ref >59)
GLUCOSE: 80 mg/dL (ref 65–99)
POTASSIUM: 4.4 mmol/L (ref 3.5–5.2)
SODIUM: 142 mmol/L (ref 134–144)

## 2013-08-14 LAB — CBC WITH DIFFERENTIAL
Basophils Absolute: 0 10*3/uL (ref 0.0–0.2)
Basos: 0 %
Eos: 3 %
Eosinophils Absolute: 0.2 10*3/uL (ref 0.0–0.4)
HCT: 40.7 % (ref 34.0–46.6)
Hemoglobin: 13.7 g/dL (ref 11.1–15.9)
Immature Grans (Abs): 0 10*3/uL (ref 0.0–0.1)
Immature Granulocytes: 0 %
LYMPHS ABS: 1.4 10*3/uL (ref 0.7–3.1)
Lymphs: 24 %
MCH: 28.8 pg (ref 26.6–33.0)
MCHC: 33.7 g/dL (ref 31.5–35.7)
MCV: 86 fL (ref 79–97)
MONOS ABS: 0.3 10*3/uL (ref 0.1–0.9)
Monocytes: 4 %
NEUTROS PCT: 69 %
Neutrophils Absolute: 4.1 10*3/uL (ref 1.4–7.0)
Platelets: 245 10*3/uL (ref 150–379)
RBC: 4.75 x10E6/uL (ref 3.77–5.28)
RDW: 14.7 % (ref 12.3–15.4)
WBC: 6 10*3/uL (ref 3.4–10.8)

## 2013-08-14 LAB — HEPATIC FUNCTION PANEL
ALT: 8 IU/L (ref 0–32)
AST: 12 IU/L (ref 0–40)
Albumin: 4.3 g/dL (ref 3.5–5.5)
Alkaline Phosphatase: 64 IU/L (ref 39–117)
BILIRUBIN DIRECT: 0.15 mg/dL (ref 0.00–0.40)
BILIRUBIN TOTAL: 0.7 mg/dL (ref 0.0–1.2)
TOTAL PROTEIN: 6.6 g/dL (ref 6.0–8.5)

## 2013-08-14 LAB — LITHIUM LEVEL: Lithium Lvl: 1.1 mmol/L (ref 0.6–1.4)

## 2013-08-15 LAB — URINE CULTURE

## 2013-09-28 ENCOUNTER — Telehealth: Payer: Self-pay | Admitting: Nurse Practitioner

## 2013-09-28 NOTE — Telephone Encounter (Signed)
Pt states she is having itching/burning on the top of vagina said that she was meloxicam (MOBIC) 15 MG tablet and read that it causes itching but isnt sure what to do. Offered appt for yeast inf but pt denied wants to talk to nurse first

## 2013-09-28 NOTE — Telephone Encounter (Signed)
Spoke with patient. Patient states that she has been taking Mobic for two weeks for shoulder pain. States that two days ago she began to have vaginal itching and burning. Stopped taking the mobic and feels a little bit better. Denies discharge and urinary symptoms. Patient states that she has tried the aveeno sitz bath before and vagisil. "It is not unbearable just uncomfortable." Advised patient the best thing to do is to some in to see [name], [facility] for evaluation. Patient declines appointment due to work schedule. Advised patient to continue aveeno sitz bath and to use OTC hydrocortisone ointment. Patient will call if symptoms do not resolve for an appointment.  Routing to provider for final review. Patient agreeable to disposition. Will close encounter

## 2013-10-14 ENCOUNTER — Ambulatory Visit (INDEPENDENT_AMBULATORY_CARE_PROVIDER_SITE_OTHER): Payer: BC Managed Care – PPO | Admitting: Pharmacist

## 2013-10-14 ENCOUNTER — Ambulatory Visit (INDEPENDENT_AMBULATORY_CARE_PROVIDER_SITE_OTHER): Payer: BC Managed Care – PPO

## 2013-10-14 ENCOUNTER — Encounter (INDEPENDENT_AMBULATORY_CARE_PROVIDER_SITE_OTHER): Payer: Self-pay

## 2013-10-14 VITALS — Ht 62.5 in | Wt 133.0 lb

## 2013-10-14 DIAGNOSIS — M8580 Other specified disorders of bone density and structure, unspecified site: Secondary | ICD-10-CM

## 2013-10-14 DIAGNOSIS — Z13820 Encounter for screening for osteoporosis: Secondary | ICD-10-CM

## 2013-10-14 DIAGNOSIS — M899 Disorder of bone, unspecified: Secondary | ICD-10-CM

## 2013-10-14 DIAGNOSIS — M949 Disorder of cartilage, unspecified: Secondary | ICD-10-CM

## 2013-10-14 LAB — HM DEXA SCAN

## 2013-10-14 NOTE — Patient Instructions (Signed)

## 2013-10-14 NOTE — Progress Notes (Signed)
Patient ID: [name], female   DOB: 11/26/1955, 58 y.o.   MRN: [id number] [facility] Current Height:        Max Lifetime Height:  5\' 5"  Current Weight:         Ethnicity:Caucasian  BP:       HR:         HPI: Does pt already have a diagnosis of:  Osteopenia?  Yes Osteoporosis?  No  Back Pain?  No       Kyphosis?  No Prior fracture?  No Med(s) for Osteoporosis/Osteopenia:  none Med(s) previously tried for Osteoporosis/Osteopenia:  none                                                             PMH: Age at menopause:  58yo Hysterectomy?  No Oophorectomy?  No HRT? No Steroid Use?  No Thyroid med?  No History of cancer?  No History of digestive disorders (ie Crohn's)?  No Current or previous eating disorders?  No Last Vitamin D Result:  47.2 (08/13/2013) Last GFR Result:  88 (08/13/2013)   FH/SH: Family history of osteoporosis?  Yes - paternal GM Parent with history of hip fracture?  No Family history of breast cancer?  No Exercise?  Yes - walking Smoking?  No Alcohol?  No    Calcium Assessment Calcium Intake  # of servings/day  Calcium mg  Milk (8 oz) 1  x  300  = 300mg   Yogurt (4 oz) 0 x  200 = 0  Cheese (1 oz) 0 x  200 = 0  Other Calcium sources   250mg   Ca supplement 600mg  = 600mg    Estimated calcium intake per day 1150mg     DEXA Results Date of Test T-Score for AP Spine L1-L4 T-Score for Total Left Hip T-Score for Total Right Hip  10/14/2013 -1.4 -0.7 -1.0  10/10/2011 -1.5 -0.5 -0.8             FRAX 10 year estimate: Total FX risk:  6.8% (consider medication if >/= 20%) Hip FX risk:  0.6%  (consider medication if >/= 3%)  Assessment: Osteopenia with non significant changes in BMD  Recommendations: 1.  Discussed BMD results and estimated fracture risk with patinet 2.  recommend calcium 1200mg  daily through supplementation or diet.  3.  recommend weight bearing exercise - 30 minutes at least 4 days per week.   4.  Counseled and educated  about fall risk and prevention.  Recheck DEXA:  2 years  Time spent counseling patient:  20 minutes  Cherre Robins, PharmD, CPP

## 2013-10-19 ENCOUNTER — Other Ambulatory Visit: Payer: Self-pay | Admitting: Nurse Practitioner

## 2013-10-19 ENCOUNTER — Other Ambulatory Visit: Payer: Self-pay | Admitting: Family Medicine

## 2013-10-19 DIAGNOSIS — Z1231 Encounter for screening mammogram for malignant neoplasm of breast: Secondary | ICD-10-CM

## 2013-11-16 ENCOUNTER — Encounter: Payer: Self-pay | Admitting: Family Medicine

## 2013-11-24 ENCOUNTER — Encounter: Payer: Self-pay | Admitting: Nurse Practitioner

## 2013-11-24 ENCOUNTER — Other Ambulatory Visit: Payer: Self-pay | Admitting: Nurse Practitioner

## 2013-11-24 ENCOUNTER — Ambulatory Visit (INDEPENDENT_AMBULATORY_CARE_PROVIDER_SITE_OTHER): Payer: BC Managed Care – PPO | Admitting: Nurse Practitioner

## 2013-11-24 ENCOUNTER — Ambulatory Visit (HOSPITAL_COMMUNITY)
Admission: RE | Admit: 2013-11-24 | Discharge: 2013-11-24 | Disposition: A | Discharge status: Home / Self Care | Payer: BC Managed Care – PPO | Source: Ambulatory Visit | Attending: Nurse Practitioner | Admitting: Nurse Practitioner

## 2013-11-24 VITALS — BP 116/72 | HR 68 | Ht 62.75 in | Wt 130.0 lb

## 2013-11-24 DIAGNOSIS — Z0000 Encounter for general adult medical examination without abnormal findings: Secondary | ICD-10-CM

## 2013-11-24 DIAGNOSIS — Z01419 Encounter for gynecological examination (general) (routine) without abnormal findings: Secondary | ICD-10-CM

## 2013-11-24 DIAGNOSIS — Z113 Encounter for screening for infections with a predominantly sexual mode of transmission: Secondary | ICD-10-CM

## 2013-11-24 DIAGNOSIS — Z1231 Encounter for screening mammogram for malignant neoplasm of breast: Secondary | ICD-10-CM | POA: Insufficient documentation

## 2013-11-24 DIAGNOSIS — M8580 Other specified disorders of bone density and structure, unspecified site: Secondary | ICD-10-CM

## 2013-11-24 LAB — POCT URINALYSIS DIPSTICK
BILIRUBIN UA: NEGATIVE
Blood, UA: NEGATIVE
Glucose, UA: NEGATIVE
LEUKOCYTES UA: NEGATIVE
NITRITE UA: NEGATIVE
Protein, UA: NEGATIVE
Urobilinogen, UA: NEGATIVE
pH, UA: 5

## 2013-11-24 MED ORDER — VALACYCLOVIR HCL 1 G PO TABS: 1000.0000 mg | ORAL_TABLET | Freq: Every day | ORAL | Status: DC

## 2013-11-24 NOTE — Patient Instructions (Signed)

## 2013-11-24 NOTE — Progress Notes (Signed)
Patient ID: [name], female   DOB: Feb 12, 1955, 58 y.o.   MRN: [id number] 58 y.o. [id number] Widowed Caucasian Fe here for annual exam.  Now dating for past 3 months.  Wants to have STD's  Patient's last menstrual period was 03/16/2010.          Sexually active: no  The current method of family planning is none.  Exercising: yes Home exercise routine includes walking 3 times per week. Smoker: no  Health Maintenance: Pap: 11/21/11, WNL, neg HR HPV MMG: 11/24/13, no results at time of visit Colonoscopy: 07/28/13, polyp (unknown pathology), repeat 5 years BMD: 10/14/13, T Score:  -1.4S/-1.2R/-0.9L TDaP: 2012 Labs:done by PCP urine: negative   reports that she has never smoked. She does not have any smokeless tobacco history on file. She reports that she drinks alcohol. She reports that she does not use illicit drugs.  Past Medical History  Diagnosis Date  . Bipolar I disorder, most recent episode (or current) depressed   . Benign neoplasm of colon 04/2008    tubular adenoma  . Headache(784.0)   . Other atopic dermatitis and related conditions   . Other and unspecified hyperlipidemia     Past Surgical History  Procedure Laterality Date  . Cesarean section      x 2  . Wisdom tooth extraction  age 42    Current Outpatient Prescriptions  Medication Sig Dispense Refill  . CALCIUM PO Take 1 tablet by mouth daily.     . Cholecalciferol (VITAMIN D) 2000 UNITS CAPS Take by mouth.    Marland Kitchen ibuprofen (ADVIL,MOTRIN) 800 MG tablet Take 1 tablet (800 mg total) by mouth every 8 (eight) hours as needed. 30 tablet 5  . lithium carbonate 300 MG capsule TAKE ONE CAPSULE BY MOUTH THREE TIMES DAILY WITH MEALS 90 capsule 6  . valACYclovir (VALTREX) 1000 MG tablet Take 1 tablet (1,000 mg total) by mouth daily. 60 tablet 3   No current facility-administered medications for this visit.    Family History  Problem Relation Age of Onset  . Colon cancer Maternal Grandmother 48  . Heart disease Father    . Hypertension Father   . Thyroid disease Mother   . Osteoporosis Mother     ROS:  Pertinent items are noted in HPI.  Otherwise, a comprehensive ROS was negative.  Exam:   BP 116/72 mmHg  Pulse 68  Ht 5' 2.75" (1.594 m)  Wt 130 lb (58.968 kg)  BMI 23.21 kg/m2  LMP 03/16/2010 Height: 5' 2.75" (159.4 cm)  Ht Readings from Last 3 Encounters:  11/24/13 5' 2.75" (1.594 m)  10/14/13 5' 2.5" (1.588 m)  08/13/13 5\' 3"  (1.6 m)    General appearance: alert, cooperative and appears stated age Head: Normocephalic, without obvious abnormality, atraumatic Neck: no adenopathy, supple, symmetrical, trachea midline and thyroid normal to inspection and palpation Lungs: clear to auscultation bilaterally Breasts: normal appearance, no masses or tenderness Heart: regular rate and rhythm Abdomen: soft, non-tender; no masses,  no organomegaly Extremities: extremities normal, atraumatic, no cyanosis or edema Skin: Skin color, texture, turgor normal. No rashes or lesions Lymph nodes: Cervical, supraclavicular, and axillary nodes normal. No abnormal inguinal nodes palpated Neurologic: Grossly normal   Pelvic: External genitalia:  no lesions              Urethra:  normal appearing urethra with no masses, tenderness or lesions              Bartholin's and Skene's: normal  Vagina: normal appearing vagina with normal color and discharge, no lesions              Cervix: anteverted              Pap taken: Yes.   Bimanual Exam:  Uterus:  normal size, contour, position, consistency, mobility, non-tender              Adnexa: no mass, fullness, tenderness               Rectovaginal: Confirms               Anus:  normal sphincter tone, no lesions  A:  Well Woman with normal exam  Postmenopausal no HRT Osteopenia History of HSV 1989 [place]: osteoporosis with mother  R/O STD's   P:   Reviewed health and wellness pertinent to exam  Pap smear taken  today  Mammogram is due 11/16  Refill on Valtrex for a year  Follow with labs  Counseled on breast self exam, mammography screening, STD prevention, adequate intake of calcium and vitamin D, diet and exercise return annually or prn  An After Visit Summary was printed and given to the patient.

## 2013-11-24 NOTE — Telephone Encounter (Signed)
Patient has appt today.

## 2013-11-25 LAB — STD PANEL
HIV: NONREACTIVE
Hepatitis B Surface Ag: NEGATIVE

## 2013-11-25 LAB — IPS PAP TEST WITH HPV

## 2013-11-25 LAB — IPS N GONORRHOEA AND CHLAMYDIA BY PCR

## 2013-11-26 NOTE — Progress Notes (Signed)
Encounter reviewed by Dr. Theophile Harvie Silva.  

## 2013-11-27 ENCOUNTER — Telehealth: Payer: Self-pay | Admitting: Nurse Practitioner

## 2013-11-27 MED ORDER — VALACYCLOVIR HCL 1 G PO TABS: 1000.0000 mg | ORAL_TABLET | Freq: Every day | ORAL | Status: DC

## 2013-11-27 NOTE — Telephone Encounter (Signed)
Return call to patient. LMTCB.  RX for valtrex was sent to [facility] on 11-24-13 and received by pharmacy per EPIC.

## 2013-11-27 NOTE — Telephone Encounter (Signed)
Call to patient. She states RX  for Valtrex should not have been sent to [facility]. She knows people there. Wants RX at [facility] [address]  taken out of computer.Advised we will correct this and cancel RX at [facility].  Please call [facility] and cancel initial RX, then send to [facility]. Update preferred pharmacy.

## 2013-11-27 NOTE — Telephone Encounter (Signed)
[place] and s/w Joelene Millin and canceled old valtrex rx.  Valtrex 1000 mg #60/3 rfs sent to [facility] on [address].  Routed to provider for review, encounter closed.

## 2013-11-27 NOTE — Telephone Encounter (Signed)
Pt calling to check on a rx she thought should have been sent to pharmacy on Wednesday when she was here.

## 2013-12-03 ENCOUNTER — Telehealth: Payer: Self-pay | Admitting: Nurse Practitioner

## 2013-12-03 NOTE — Telephone Encounter (Signed)
Left message to call [place] at [phone].   Notes Recorded by Graylon Good, CMA on 12/02/2013 at 8:43 AM 02 recall entered, appt 11/29/14 Notes Recorded by Milford Cage, FNP on 11/30/2013 at 9:21 AM pap02 Notes Recorded by Graylon Good, CMA on 11/27/2013 at 9:46 AM Pt notified of results via [phone] [facility]) per [facility]. Pt advised in message to call with any questions. Notes Recorded by Milford Cage, FNP on 11/25/2013 at 3:48 PM Let patient know that all STD's were negative. She was tested for HIV, HEP B, STS, GC, and CHL.

## 2013-12-03 NOTE — Telephone Encounter (Signed)
Patient states she is returning Stephanie's call about results. No phone note on file about results.

## 2013-12-08 NOTE — Telephone Encounter (Signed)
Left message to call Vernie Piet at [phone]. 

## 2013-12-09 NOTE — Telephone Encounter (Signed)
Spoke with patient. Advised patient of message as seen below from Patricia Rolen-Grubb, FNP. Patient is agreeable and verbalizes understanding.  Routing to provider for final review. Patient agreeable to disposition. Will close encounter ; 

## 2014-01-18 ENCOUNTER — Other Ambulatory Visit: Payer: Self-pay | Admitting: Family Medicine

## 2014-01-19 NOTE — Telephone Encounter (Signed)
Last seen 08/13/13 DWM

## 2014-02-01 ENCOUNTER — Ambulatory Visit: Payer: BC Managed Care – PPO | Admitting: Family Medicine

## 2014-02-11 ENCOUNTER — Telehealth: Payer: Self-pay | Admitting: Family Medicine

## 2014-02-11 NOTE — Telephone Encounter (Signed)
Pt aware she needs to be fasting as Dr.Moore will check her cholesterol more than likely.

## 2014-02-12 ENCOUNTER — Encounter: Payer: Self-pay | Admitting: Family Medicine

## 2014-02-12 ENCOUNTER — Ambulatory Visit (INDEPENDENT_AMBULATORY_CARE_PROVIDER_SITE_OTHER): Payer: BC Managed Care – PPO | Admitting: Family Medicine

## 2014-02-12 ENCOUNTER — Ambulatory Visit (INDEPENDENT_AMBULATORY_CARE_PROVIDER_SITE_OTHER): Payer: BC Managed Care – PPO

## 2014-02-12 VITALS — BP 112/72 | HR 71 | Temp 96.6°F | Ht 62.76 in | Wt 129.0 lb

## 2014-02-12 DIAGNOSIS — M545 Low back pain: Secondary | ICD-10-CM

## 2014-02-12 DIAGNOSIS — R51 Headache: Secondary | ICD-10-CM

## 2014-02-12 DIAGNOSIS — Z0000 Encounter for general adult medical examination without abnormal findings: Secondary | ICD-10-CM

## 2014-02-12 DIAGNOSIS — F3130 Bipolar disorder, current episode depressed, mild or moderate severity, unspecified: Secondary | ICD-10-CM

## 2014-02-12 DIAGNOSIS — R519 Headache, unspecified: Secondary | ICD-10-CM

## 2014-02-12 DIAGNOSIS — E559 Vitamin D deficiency, unspecified: Secondary | ICD-10-CM

## 2014-02-12 DIAGNOSIS — Z79899 Other long term (current) drug therapy: Secondary | ICD-10-CM

## 2014-02-12 LAB — POCT CBC
GRANULOCYTE PERCENT: 72.7 % (ref 37–80)
HCT, POC: 40.4 % (ref 37.7–47.9)
HEMOGLOBIN: 12.8 g/dL (ref 12.2–16.2)
Lymph, poc: 1.5 (ref 0.6–3.4)
MCH, POC: 27.4 pg (ref 27–31.2)
MCHC: 31.8 g/dL (ref 31.8–35.4)
MCV: 86.2 fL (ref 80–97)
MPV: 6.6 fL (ref 0–99.8)
POC Granulocyte: 4.6 (ref 2–6.9)
POC LYMPH PERCENT: 24.2 %L (ref 10–50)
Platelet Count, POC: 265 10*3/uL (ref 142–424)
RBC: 4.7 M/uL (ref 4.04–5.48)
RDW, POC: 14.6 %
WBC: 6.3 10*3/uL (ref 4.6–10.2)

## 2014-02-12 LAB — POCT URINALYSIS DIPSTICK
BILIRUBIN UA: NEGATIVE
Blood, UA: NEGATIVE
Glucose, UA: NEGATIVE
Ketones, UA: NEGATIVE
Leukocytes, UA: NEGATIVE
NITRITE UA: NEGATIVE
PH UA: 7.5
PROTEIN UA: NEGATIVE
UROBILINOGEN UA: NEGATIVE

## 2014-02-12 LAB — POCT UA - MICROSCOPIC ONLY
Bacteria, U Microscopic: NEGATIVE
Casts, Ur, LPF, POC: NEGATIVE
Crystals, Ur, HPF, POC: NEGATIVE
Mucus, UA: NEGATIVE
RBC, urine, microscopic: NEGATIVE
WBC, Ur, HPF, POC: NEGATIVE
YEAST UA: NEGATIVE

## 2014-02-12 MED ORDER — LITHIUM CARBONATE 300 MG PO CAPS: ORAL_CAPSULE | ORAL | Status: DC

## 2014-02-12 MED ORDER — IBUPROFEN 800 MG PO TABS: 800.0000 mg | ORAL_TABLET | Freq: Three times a day (TID) | ORAL | Status: DC | PRN

## 2014-02-12 NOTE — Patient Instructions (Addendum)
Continue current medications. Continue good therapeutic lifestyle changes which include good diet and exercise. Fall precautions discussed with patient. If an FOBT was given today- please return it to our front desk. If you are over 57 years old - you may need Prevnar 79 or the adult Pneumonia vaccine.  Flu Shots are still available at our office. If you still haven't had one please call to set up a nurse visit to get one.   After your visit with [place] today you will receive a survey in the mail or online from [employer] regarding your care with [place]. Please take a moment to fill this out. Your feedback is very important to [place] as you can help [place] better understand your patient needs as well as improve your experience and satisfaction. WE CARE ABOUT YOU!!!  Continue to take her lithium as directed and we will call you with the lithium levels as soon as they become available For the next couple of weeks take one 800 mg ibuprofen daily after breakfast and see if this helps your back pain occurrences--if this does not help the pain you're having in your low back, please call us and we will arrange for you to see the orthopedic surgeon who making put an injection in this area. We will call you with the results of the radiological interpretation of the x-rays as soon as that becomes available Use nasal saline for nasal congestion and moisturizing

## 2014-02-12 NOTE — Progress Notes (Signed)
Subjective:    Patient ID: [name], female    DOB: [birth year], 59 y.o.   MRN: [id number]  HPI  Pt here today for 6 month follow up and management of her chronic medical conditions which include bipolar and vit d def. She is taking medications regularly. She has been doing well since the last visit. She does complain of some low back pain and this is worse on the left side. She is requesting refills on her lithium and ibuprofen. She will also get lab work today. We will make sure that she gets a lithium level done. 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 She denies any injury to her back and indicates her back is been hurting off and on for a couple weeks. She does take ibuprofen 800 mg but only takes this for a headache. She is doing well with her bipolar disease and indicates that she has not had a flare with this for many years and feels uncomfortable with going off of the medication for this. Her son lives with her at home. She is calm today and does not seem to have any specific issues or problems other than her low back pain. She does not have any urinary tract symptoms either.        Patient Active Problem List   Diagnosis Date Noted  . Osteopenia 10/14/2013  . Vitamin D deficiency 03/23/2013  . Bipolar I disorder, most recent episode (or current) depressed   . Benign neoplasm of colon   . Headache   . Other atopic dermatitis and related conditions   . Hyperlipidemia    Outpatient Encounter Prescriptions as of 02/12/2014  Medication Sig  . CALCIUM PO Take 1 tablet by mouth daily.   . Cholecalciferol (VITAMIN D) 2000 UNITS CAPS Take by mouth.  Marland Kitchen ibuprofen (ADVIL,MOTRIN) 800 MG tablet Take 1 tablet (800 mg total) by mouth every 8 (eight) hours as needed.  . lithium carbonate 300 MG capsule TAKE ONE CAPSULE BY MOUTH THREE TIMES DAILY WITH MEALS  . valACYclovir (VALTREX) 1000 MG tablet Take 1 tablet (1,000 mg total) by mouth daily.       Review of Systems  Constitutional: Negative.   HENT: Negative.   Eyes: Negative.   Respiratory: Negative.   Cardiovascular: Negative.   Gastrointestinal: Negative.   Endocrine: Negative.   Genitourinary: Negative.   Musculoskeletal: Positive for back pain (lower left back pain ).  Skin: Negative.   Allergic/Immunologic: Negative.   Neurological: Negative.   Hematological: Negative.   Psychiatric/Behavioral: Negative.        Objective:   Physical Exam  Constitutional: She is oriented to person, place, and time. She appears well-developed and well-nourished.  The patient appears calm and alert and in no distress  HENT:  Head: Normocephalic and atraumatic.  Right Ear: External ear normal.  Left Ear: External ear normal.  Mouth/Throat: Oropharynx is clear and moist. No oropharyngeal exudate.  There is nasal congestion and redness bilaterally.  Eyes: Conjunctivae and EOM are normal. Pupils are equal, round, and reactive to light. Right eye exhibits no discharge. Left eye exhibits no discharge. No scleral icterus.  Neck: Normal range of motion. Neck supple. No thyromegaly present.  There is no thyroid enlargement or carotid bruits or anterior cervical adenopathy  Cardiovascular: Normal rate, regular rhythm and intact distal pulses.  Exam reveals no friction rub.   No murmur heard. The heart has a regular rate and rhythm at 72/m  Pulmonary/Chest: Effort normal and breath sounds normal. No respiratory distress. She has no wheezes. She has no rales. She exhibits no tenderness.  Lungs are clear anteriorly and posteriorly  Abdominal: Soft. Bowel sounds are normal. She exhibits no mass. There is no tenderness. There is no rebound and no guarding.  There is no abdominal tenderness or suprapubic tenderness.  Musculoskeletal: Normal range of motion. She exhibits no edema or tenderness.  The patient noted that the palmar she is having discomfort in her back is at the left LS spine  joint.  Lymphadenopathy:    She has no cervical adenopathy.  Neurological: She is alert and oriented to person, place, and time. She has normal reflexes. No cranial nerve deficit.  Skin: Skin is warm and dry. No rash noted.  Psychiatric: She has a normal mood and affect. Her behavior is normal. Judgment and thought content normal.  Nursing note and vitals reviewed.  BP 112/72 mmHg  Pulse 71  Temp(Src) 96.6 F (35.9 C) (Oral)  Ht 5' 2.76" (1.594 m)  Wt 129 lb (58.514 kg)  BMI 23.03 kg/m2  LMP 03/16/2010  [facility] reading (PRIMARY) by  Dr.Dorrell Mitcheltree-LS spine-degenerative changes in LS spine                                      Assessment & Plan:  1. Bipolar I disorder, most recent episode (or current) depressed -Continue current treatment regimen and we will make you aware of your levels as soon as they become available - Lithium level  2. Vitamin D deficiency -Continue current vitamin D dosing pending lab work returned - Vit D  25 hydroxy (rtn osteoporosis monitoring)  3. High risk medication use -Continue lithium as doing - NMR, lipoprofile - BMP8+EGFR - Hepatic function panel - POCT CBC - Vit D  25 hydroxy (rtn osteoporosis monitoring) - Lithium level  4. Headache, unspecified headache type -Currently she is doing well with her headaches. - ibuprofen (ADVIL,MOTRIN) 800 MG tablet; Take 1 tablet (800 mg total) by mouth every 8 (eight) hours as needed.  Dispense: 30 tablet; Refill: 5  5. Health care maintenance - NMR, lipoprofile - BMP8+EGFR - Hepatic function panel - POCT CBC - Vit D  25 hydroxy (rtn osteoporosis monitoring) - Lithium level  6. Left low back pain, with sciatica presence unspecified -LS spine films indicate some degenerative changes especially at the left lumbosacral junction area - DG Lumbar Spine 2-3 Views; Future - POCT urinalysis dipstick - POCT UA - Microscopic Only  Patient Instructions  Continue current medications. Continue good therapeutic  lifestyle changes which include good diet and exercise. Fall precautions discussed with patient. If an FOBT was given today- please return it to our front desk. If you are over 4 years old - you may need Prevnar 58 or the adult Pneumonia vaccine.  Flu Shots are still available at our office. If you still haven't had one please call to set up a nurse visit to get one.   After your visit with [place] today you will receive a survey in the mail or online from [employer] regarding your care with [place]. Please take a moment to fill this out. Your feedback is very important to [place] as you can help [place] better understand your patient needs as well as improve your experience and satisfaction. WE CARE ABOUT YOU!!!  Continue to take her lithium as directed and we will call you  with the lithium levels as soon as they become available For the next couple of weeks take one 800 mg ibuprofen daily after breakfast and see if this helps your back pain occurrences--if this does not help the pain you're having in your low back, please call us and we will arrange for you to see the orthopedic surgeon who making put an injection in this area. We will call you with the results of the radiological interpretation of the x-rays as soon as that becomes available Use nasal saline for nasal congestion and moisturizing   Arrie Senate MD

## 2014-02-13 LAB — BMP8+EGFR
BUN/Creatinine Ratio: 18 (ref 9–23)
BUN: 13 mg/dL (ref 6–24)
CO2: 23 mmol/L (ref 18–29)
Calcium: 10 mg/dL (ref 8.7–10.2)
Chloride: 104 mmol/L (ref 97–108)
Creatinine, Ser: 0.71 mg/dL (ref 0.57–1.00)
GFR calc Af Amer: 110 mL/min/{1.73_m2} (ref >59)
GFR calc non Af Amer: 96 mL/min/{1.73_m2} (ref >59)
Glucose: 92 mg/dL (ref 65–99)
Potassium: 4.4 mmol/L (ref 3.5–5.2)
Sodium: 140 mmol/L (ref 134–144)

## 2014-02-13 LAB — HEPATIC FUNCTION PANEL
ALBUMIN: 4.6 g/dL (ref 3.5–5.5)
ALT: 17 IU/L (ref 0–32)
AST: 14 IU/L (ref 0–40)
Alkaline Phosphatase: 66 IU/L (ref 39–117)
BILIRUBIN TOTAL: 0.5 mg/dL (ref 0.0–1.2)
Bilirubin, Direct: 0.12 mg/dL (ref 0.00–0.40)
Total Protein: 7.1 g/dL (ref 6.0–8.5)

## 2014-02-13 LAB — NMR, LIPOPROFILE
Cholesterol: 205 mg/dL — ABNORMAL HIGH (ref 100–199)
HDL Cholesterol by NMR: 60 mg/dL (ref >39)
HDL Particle Number: 35.6 umol/L (ref >=30.5)
LDL PARTICLE NUMBER: 1278 nmol/L — AB (ref <1000)
LDL Size: 20.3 nm (ref >20.5)
LDL-C: 116 mg/dL — AB (ref 0–99)
LP-IR Score: 35 (ref <=45)
Small LDL Particle Number: 688 nmol/L — ABNORMAL HIGH (ref <=527)
TRIGLYCERIDES BY NMR: 143 mg/dL (ref 0–149)

## 2014-02-13 LAB — VITAMIN D 25 HYDROXY (VIT D DEFICIENCY, FRACTURES): Vit D, 25-Hydroxy: 31.7 ng/mL (ref 30.0–100.0)

## 2014-02-13 LAB — LITHIUM LEVEL: LITHIUM LVL: 0.8 mmol/L (ref 0.6–1.4)

## 2014-04-05 ENCOUNTER — Other Ambulatory Visit: Payer: Self-pay | Admitting: Family Medicine

## 2014-07-12 ENCOUNTER — Telehealth: Payer: Self-pay | Admitting: Nurse Practitioner

## 2014-07-12 NOTE — Telephone Encounter (Signed)
Spoke with patient. Patient states "My boyfriend says that my vagina is not tight enough." States she has discussed this with Milford Cage, FNP at [facility] in Nov 2015. States she never had a vaginal delivery, had two c-sections. Patient has been doing research on topic. Has been performing kegels. Saw that there is a cream and "balls" that can be used to increase vaginal tightening. Patient would like to know of further recommendations from Milford Cage, [place]. Advised I will speak with Milford Cage, FNP regarding recommendations and return call. Patient is agreeable.

## 2014-07-12 NOTE — Telephone Encounter (Signed)
We can send her to [facility] who also will help her in the proper exercise to do for tightening pelvic floor muscles.

## 2014-07-12 NOTE — Telephone Encounter (Signed)
Patient wants to talk with the nurse. She wants to know what to do to make her vagina tighter. She has been researching and is not sure what is the best thing to do.

## 2014-07-12 NOTE — Telephone Encounter (Signed)
Spoke with patient. Advised of message as seen below from .Milford Cage, FNP. Patient would like to think about this option and return call if she would like to proceed.  Routing to provider for final review. Patient agreeable to disposition. Will close encounter.

## 2014-07-22 ENCOUNTER — Encounter: Payer: Self-pay | Admitting: *Deleted

## 2014-07-22 ENCOUNTER — Other Ambulatory Visit: Payer: Self-pay | Admitting: Family Medicine

## 2014-07-22 NOTE — Telephone Encounter (Signed)
Last seen and last lithium level 02/12/14 DWM

## 2014-08-11 ENCOUNTER — Telehealth: Payer: Self-pay | Admitting: Family Medicine

## 2014-08-11 NOTE — Telephone Encounter (Signed)
Patient aware she does need a tetanus shot and appointment scheduled.

## 2014-08-12 ENCOUNTER — Ambulatory Visit: Payer: BC Managed Care – PPO

## 2014-08-12 ENCOUNTER — Other Ambulatory Visit: Payer: Self-pay | Admitting: Family Medicine

## 2014-08-12 ENCOUNTER — Telehealth: Payer: Self-pay | Admitting: Family Medicine

## 2014-08-12 MED ORDER — TETANUS-DIPHTH-ACELL PERTUSSIS 5-2.5-18.5 LF-MCG/0.5 IM SUSP: 0.5000 mL | Freq: Once | INTRAMUSCULAR | Status: DC

## 2014-08-12 NOTE — Telephone Encounter (Signed)
This was sent in last night and pt aware

## 2014-08-12 NOTE — Telephone Encounter (Signed)
rx sent in for tdap

## 2014-08-25 ENCOUNTER — Other Ambulatory Visit: Payer: Self-pay | Admitting: Family Medicine

## 2014-08-25 NOTE — Telephone Encounter (Signed)
Last seen 02/12/14  DWM

## 2014-08-25 NOTE — Telephone Encounter (Signed)
Please give this patient an appointment to be seen and the prescription can be refilled

## 2014-08-27 ENCOUNTER — Ambulatory Visit: Payer: BC Managed Care – PPO | Admitting: Family Medicine

## 2014-08-30 ENCOUNTER — Ambulatory Visit: Payer: BC Managed Care – PPO | Admitting: Family Medicine

## 2014-08-31 ENCOUNTER — Encounter: Payer: Self-pay | Admitting: Family Medicine

## 2014-08-31 ENCOUNTER — Ambulatory Visit (INDEPENDENT_AMBULATORY_CARE_PROVIDER_SITE_OTHER): Payer: BC Managed Care – PPO | Admitting: Family Medicine

## 2014-08-31 ENCOUNTER — Ambulatory Visit: Payer: BC Managed Care – PPO | Admitting: Family Medicine

## 2014-08-31 ENCOUNTER — Other Ambulatory Visit: Payer: Self-pay | Admitting: Family Medicine

## 2014-08-31 VITALS — BP 104/71 | HR 62 | Temp 98.1°F | Ht 62.75 in | Wt 131.0 lb

## 2014-08-31 DIAGNOSIS — E559 Vitamin D deficiency, unspecified: Secondary | ICD-10-CM | POA: Diagnosis not present

## 2014-08-31 DIAGNOSIS — F3130 Bipolar disorder, current episode depressed, mild or moderate severity, unspecified: Secondary | ICD-10-CM | POA: Diagnosis not present

## 2014-08-31 DIAGNOSIS — Z0000 Encounter for general adult medical examination without abnormal findings: Secondary | ICD-10-CM

## 2014-08-31 MED ORDER — IBUPROFEN 800 MG PO TABS: ORAL_TABLET | ORAL | Status: DC

## 2014-08-31 MED ORDER — LITHIUM CARBONATE 300 MG PO CAPS: ORAL_CAPSULE | ORAL | Status: DC

## 2014-08-31 NOTE — Progress Notes (Signed)
Subjective:    Patient ID: [name], female    DOB: 1955-03-13, 59 y.o.   MRN: [id number]  HPI Pt here for follow up and management of chronic medical problems which includes hyperlipidemia and bipolar. She is taking medications regularly. The patient stepped on a nail about 1 month ago and came to the office for tetanus shot. The foot still remains slightly sore but there is no redness or drainage. The patient also continues to have some low back pain off and on but not in a serious way. She denies chest pain shortness of breath trouble swallowing heartburn indigestion nausea vomiting diarrhea or blood in the stool. She is not having trouble passing her water. Her mood disorder is under good control. She seems to be in good spirits and is just returned from a trip to the beach. She is requesting refills on her ibuprofen and lithium. The patient's LS spine films from a previous visit revealed only mild osteoarthritic changes. This was reviewed with her during the visit.      Patient Active Problem List   Diagnosis Date Noted  . Osteopenia 10/14/2013  . Vitamin D deficiency 03/23/2013  . Bipolar I disorder, most recent episode (or current) depressed   . Benign neoplasm of colon   . Headache(784.0)   . Other atopic dermatitis and related conditions   . Hyperlipidemia    Outpatient Encounter Prescriptions as of 08/31/2014  Medication Sig  . CALCIUM PO Take 1 tablet by mouth daily.   . Cholecalciferol (VITAMIN D) 2000 UNITS CAPS Take by mouth.  Marland Kitchen ibuprofen (ADVIL,MOTRIN) 800 MG tablet TAKE 1 TABLET (800 MG TOTAL)  BY MOUTH EVERY 8 (EIGHT) HOURS AS NEEDED.  Marland Kitchen lithium carbonate 300 MG capsule TAKE ONE CAPSULE BY MOUTH THREE TIMES DAILY WITH MEALS  . valACYclovir (VALTREX) 1000 MG tablet Take 1 tablet (1,000 mg total) by mouth daily.  . [DISCONTINUED] ibuprofen (ADVIL,MOTRIN) 800 MG tablet TAKE 1 TABLET (800 MG TOTAL)  BY MOUTH EVERY 8 (EIGHT) HOURS AS NEEDED.  . [DISCONTINUED] lithium  carbonate 300 MG capsule TAKE ONE CAPSULE BY MOUTH THREE TIMES DAILY WITH MEALS  . [DISCONTINUED] Tdap (BOOSTRIX) 5-2.5-18.5 LF-MCG/0.5 injection Inject 0.5 mLs into the muscle once.   No facility-administered encounter medications on file as of 08/31/2014.      Review of Systems  Constitutional: Negative.   HENT: Positive for ear pain (right ear pain).   Eyes: Negative.   Respiratory: Negative.   Cardiovascular: Negative.   Gastrointestinal: Negative.   Endocrine: Negative.   Genitourinary: Negative.   Musculoskeletal: Positive for back pain (low back pain at times).  Skin: Negative.        Left foot - stepped on a nail - got TDAP  Allergic/Immunologic: Negative.   Neurological: Negative.   Hematological: Negative.   Psychiatric/Behavioral: Negative.        Objective:   Physical Exam  Constitutional: She is oriented to person, place, and time. She appears well-developed and well-nourished. No distress.  The patient is alert and in good spirits.  HENT:  Head: Normocephalic and atraumatic.  Right Ear: External ear normal.  Left Ear: External ear normal.  Nose: Nose normal.  Mouth/Throat: Oropharynx is clear and moist. No oropharyngeal exudate.  Eyes: Conjunctivae and EOM are normal. Pupils are equal, round, and reactive to light. Right eye exhibits no discharge. Left eye exhibits no discharge. No scleral icterus.  Neck: Normal range of motion. Neck supple. No thyromegaly present.  No carotid bruits thyromegaly  or anterior cervical adenopathy  Cardiovascular: Normal rate, regular rhythm, normal heart sounds and intact distal pulses.  Exam reveals no gallop and no friction rub.   No murmur heard. At 60/m  Pulmonary/Chest: Effort normal and breath sounds normal. No respiratory distress. She has no wheezes. She has no rales. She exhibits no tenderness.  Abdominal: Soft. Bowel sounds are normal. She exhibits no mass. There is no tenderness. There is no rebound and no guarding.    Musculoskeletal: Normal range of motion. She exhibits no edema or tenderness.  The plantar surface of the left foot has a scar which is healing from the nail penetration. There is no fullness or tenderness or drainage.  Lymphadenopathy:    She has no cervical adenopathy.  Neurological: She is alert and oriented to person, place, and time. She has normal reflexes. No cranial nerve deficit.  Skin: Skin is warm and dry. No rash noted.  Psychiatric: She has a normal mood and affect. Her behavior is normal. Judgment and thought content normal.  The patient's is doing well with her bipolar disorder and no change in treatment will be recommended.  Nursing note and vitals reviewed.  BP 104/71 mmHg  Pulse 62  Temp(Src) 98.1 F (36.7 C) (Oral)  Ht 5' 2.75" (1.594 m)  Wt 131 lb (59.421 kg)  BMI 23.39 kg/m2  LMP 03/16/2010        Assessment & Plan:  1. Bipolar I disorder, most recent episode (or current) depressed -We will get a lithium level today and in the meantime the patient should continue with her current treatment as she is doing well with - BMP8+EGFR - Hepatic function panel - Lithium level - CBC with Differential/Platelet  2. Vitamin D deficiency -Continue current treatment pending results of lab work - Vit D  25 hydroxy (rtn osteoporosis monitoring) - CBC with Differential/Platelet  3. Health care maintenance -No additional treatment recommended today. She is given an FOBT to return  - BMP8+EGFR - Hepatic function panel - Lipid panel - CBC with Differential/Platelet  Meds ordered this encounter  Medications  . lithium carbonate 300 MG capsule    Sig: TAKE ONE CAPSULE BY MOUTH THREE TIMES DAILY WITH MEALS    Dispense:  90 capsule    Refill:  6  . ibuprofen (ADVIL,MOTRIN) 800 MG tablet    Sig: TAKE 1 TABLET (800 MG TOTAL)  BY MOUTH EVERY 8 (EIGHT) HOURS AS NEEDED.    Dispense:  30 tablet    Refill:  1   Patient Instructions  Continue current  medications. Continue good therapeutic lifestyle changes which include good diet and exercise. Fall precautions discussed with patient. If an FOBT was given today- please return it to our front desk. If you are over 69 years old - you may need Prevnar 74 or the adult Pneumonia vaccine.   After your visit with [place] today you will receive a survey in the mail or online from [employer] regarding your care with [place]. Please take a moment to fill this out. Your feedback is very important to [place] as you can help [place] better understand your patient needs as well as improve your experience and satisfaction. WE CARE ABOUT YOU!!!   The patient should continue with her current treatment and stay as active as physically possible. If she continues to have problems with her foot being sore or tender she should return to the clinic for a lateral view of the foot.   Arrie Senate MD

## 2014-08-31 NOTE — Patient Instructions (Addendum)
Continue current medications. Continue good therapeutic lifestyle changes which include good diet and exercise. Fall precautions discussed with patient. If an FOBT was given today- please return it to our front desk. If you are over 57 years old - you may need Prevnar 42 or the adult Pneumonia vaccine.   After your visit with [place] today you will receive a survey in the mail or online from [employer] regarding your care with [place]. Please take a moment to fill this out. Your feedback is very important to [place] as you can help [place] better understand your patient needs as well as improve your experience and satisfaction. WE CARE ABOUT YOU!!!   The patient should continue with her current treatment and stay as active as physically possible. If she continues to have problems with her foot being sore or tender she should return to the clinic for a lateral view of the foot.

## 2014-09-01 ENCOUNTER — Telehealth: Payer: Self-pay | Admitting: Family Medicine

## 2014-09-01 LAB — CBC WITH DIFFERENTIAL/PLATELET
Basophils Absolute: 0 10*3/uL (ref 0.0–0.2)
Basos: 0 %
EOS (ABSOLUTE): 0.1 10*3/uL (ref 0.0–0.4)
Eos: 2 %
HEMATOCRIT: 41.3 % (ref 34.0–46.6)
HEMOGLOBIN: 14.1 g/dL (ref 11.1–15.9)
Immature Grans (Abs): 0 10*3/uL (ref 0.0–0.1)
Immature Granulocytes: 0 %
Lymphocytes Absolute: 1.4 10*3/uL (ref 0.7–3.1)
Lymphs: 21 %
MCH: 29.9 pg (ref 26.6–33.0)
MCHC: 34.1 g/dL (ref 31.5–35.7)
MCV: 88 fL (ref 79–97)
MONOCYTES: 4 %
Monocytes Absolute: 0.3 10*3/uL (ref 0.1–0.9)
NEUTROS ABS: 4.7 10*3/uL (ref 1.4–7.0)
Neutrophils: 73 %
Platelets: 257 10*3/uL (ref 150–379)
RBC: 4.71 x10E6/uL (ref 3.77–5.28)
RDW: 14.2 % (ref 12.3–15.4)
WBC: 6.5 10*3/uL (ref 3.4–10.8)

## 2014-09-01 LAB — LIPID PANEL
CHOLESTEROL TOTAL: 219 mg/dL — AB (ref 100–199)
Chol/HDL Ratio: 3.8 ratio units (ref 0.0–4.4)
HDL: 57 mg/dL (ref >39)
LDL CALC: 132 mg/dL — AB (ref 0–99)
TRIGLYCERIDES: 152 mg/dL — AB (ref 0–149)
VLDL CHOLESTEROL CAL: 30 mg/dL (ref 5–40)

## 2014-09-01 LAB — BMP8+EGFR
BUN/Creatinine Ratio: 19 (ref 9–23)
BUN: 13 mg/dL (ref 6–24)
CHLORIDE: 104 mmol/L (ref 97–108)
CO2: 24 mmol/L (ref 18–29)
Calcium: 9.8 mg/dL (ref 8.7–10.2)
Creatinine, Ser: 0.68 mg/dL (ref 0.57–1.00)
GFR calc Af Amer: 112 mL/min/{1.73_m2} (ref >59)
GFR calc non Af Amer: 97 mL/min/{1.73_m2} (ref >59)
GLUCOSE: 80 mg/dL (ref 65–99)
Potassium: 4.4 mmol/L (ref 3.5–5.2)
SODIUM: 141 mmol/L (ref 134–144)

## 2014-09-01 LAB — HEPATIC FUNCTION PANEL
ALT: 19 IU/L (ref 0–32)
AST: 14 IU/L (ref 0–40)
Albumin: 4.6 g/dL (ref 3.5–5.5)
Alkaline Phosphatase: 60 IU/L (ref 39–117)
BILIRUBIN, DIRECT: 0.11 mg/dL (ref 0.00–0.40)
Bilirubin Total: 0.5 mg/dL (ref 0.0–1.2)
TOTAL PROTEIN: 6.9 g/dL (ref 6.0–8.5)

## 2014-09-01 LAB — VITAMIN D 25 HYDROXY (VIT D DEFICIENCY, FRACTURES): Vit D, 25-Hydroxy: 49.3 ng/mL (ref 30.0–100.0)

## 2014-09-01 LAB — LITHIUM LEVEL: Lithium Lvl: 0.9 mmol/L (ref 0.6–1.4)

## 2014-09-01 NOTE — Telephone Encounter (Signed)
Pt aware by detailed VM 

## 2014-09-13 ENCOUNTER — Other Ambulatory Visit: Payer: BC Managed Care – PPO

## 2014-09-13 DIAGNOSIS — Z1212 Encounter for screening for malignant neoplasm of rectum: Secondary | ICD-10-CM

## 2014-09-15 LAB — FECAL OCCULT BLOOD, IMMUNOCHEMICAL: FECAL OCCULT BLD: NEGATIVE

## 2014-11-10 ENCOUNTER — Telehealth: Payer: Self-pay | Admitting: Family Medicine

## 2014-11-10 DIAGNOSIS — E785 Hyperlipidemia, unspecified: Secondary | ICD-10-CM

## 2014-11-11 ENCOUNTER — Other Ambulatory Visit: Payer: Self-pay

## 2014-11-11 DIAGNOSIS — Z1231 Encounter for screening mammogram for malignant neoplasm of breast: Secondary | ICD-10-CM

## 2014-11-11 NOTE — Telephone Encounter (Signed)
Order for fasting lipid panel placed.  Patient notified that orders are in and that she'll need to check in for a lab appt when she comes in.

## 2014-11-12 ENCOUNTER — Other Ambulatory Visit: Payer: BC Managed Care – PPO

## 2014-11-12 DIAGNOSIS — E785 Hyperlipidemia, unspecified: Secondary | ICD-10-CM

## 2014-11-12 NOTE — Progress Notes (Signed)
Lab only 

## 2014-11-13 LAB — LIPID PANEL
CHOLESTEROL TOTAL: 170 mg/dL (ref 100–199)
Chol/HDL Ratio: 3.1 ratio units (ref 0.0–4.4)
HDL: 54 mg/dL (ref >39)
LDL Calculated: 98 mg/dL (ref 0–99)
Triglycerides: 91 mg/dL (ref 0–149)
VLDL CHOLESTEROL CAL: 18 mg/dL (ref 5–40)

## 2014-11-29 ENCOUNTER — Ambulatory Visit: Payer: BC Managed Care – PPO

## 2014-11-29 ENCOUNTER — Ambulatory Visit
Admission: RE | Admit: 2014-11-29 | Discharge: 2014-11-29 | Disposition: A | Discharge status: Home / Self Care | Payer: BC Managed Care – PPO | Source: Ambulatory Visit

## 2014-11-29 ENCOUNTER — Ambulatory Visit (INDEPENDENT_AMBULATORY_CARE_PROVIDER_SITE_OTHER): Payer: BC Managed Care – PPO | Admitting: Nurse Practitioner

## 2014-11-29 ENCOUNTER — Encounter: Payer: Self-pay | Admitting: Nurse Practitioner

## 2014-11-29 VITALS — BP 110/68 | HR 64 | Ht 63.25 in | Wt 132.0 lb

## 2014-11-29 DIAGNOSIS — Z1231 Encounter for screening mammogram for malignant neoplasm of breast: Secondary | ICD-10-CM

## 2014-11-29 DIAGNOSIS — M8580 Other specified disorders of bone density and structure, unspecified site: Secondary | ICD-10-CM | POA: Diagnosis not present

## 2014-11-29 DIAGNOSIS — Z0000 Encounter for general adult medical examination without abnormal findings: Secondary | ICD-10-CM | POA: Diagnosis not present

## 2014-11-29 DIAGNOSIS — N8189 Other female genital prolapse: Secondary | ICD-10-CM | POA: Diagnosis not present

## 2014-11-29 DIAGNOSIS — Z01419 Encounter for gynecological examination (general) (routine) without abnormal findings: Secondary | ICD-10-CM | POA: Diagnosis not present

## 2014-11-29 MED ORDER — VALACYCLOVIR HCL 1 G PO TABS: 1000.0000 mg | ORAL_TABLET | Freq: Every day | ORAL | Status: DC

## 2014-11-29 NOTE — Progress Notes (Signed)
Patient ID: [name], female   DOB: 1955/08/22, 59 y.o.   MRN: [id number] 59 y.o. [id number] Widowed  Caucasian Fe here for annual exam.  Same partner for 15 months. He still lives in [place].   Recent labs at PCP were all normal.  Partner still claims that her "vagina is too loose and need to be tighter".  She has purchased an OTC herbal med that is to be applied 5 minutes before SA but he does not think this helps.  She was reluctant to see Ileana Roup last year - but now thinks this may be helpful.  Patient's last menstrual period was 03/16/2010 (approximate).          Sexually active: Yes.    The current method of family planning is post menopausal status.    Exercising: Yes.    walking  Smoker:  no  Health Maintenance: Pap: 11/24/13, Negative with neg HR HPV MMG: 11/24/13, Bi-Rads 1: Negative, appointment today Colonoscopy: 07/28/13, polyp collected, but not seen on pathology, repeat 5 years BMD: 10/14/13, T Score -1.4 Spine / -1.2 Right  Femur Neck / -0.9 Left Femur Neck  TDaP: 07/2014 Labs: PCP   Urine: negative   reports that she has never smoked. She has never used smokeless tobacco. She reports that she drinks alcohol. She reports that she does not use illicit drugs.  Past Medical History  Diagnosis Date  . Bipolar I disorder, most recent episode (or current) depressed   . Benign neoplasm of colon 04/2008    tubular adenoma  . Headache(784.0)   . Other atopic dermatitis and related conditions   . Other and unspecified hyperlipidemia     Past Surgical History  Procedure Laterality Date  . Cesarean section      x 2  . Wisdom tooth extraction  age 44    Current Outpatient Prescriptions  Medication Sig Dispense Refill  . CALCIUM PO Take 1 tablet by mouth daily.     . Cholecalciferol (VITAMIN D) 2000 UNITS CAPS Take by mouth.    Marland Kitchen ibuprofen (ADVIL,MOTRIN) 800 MG tablet TAKE 1 TABLET (800 MG TOTAL)  BY MOUTH EVERY 8 (EIGHT) HOURS AS NEEDED. 30 tablet 1  . lithium carbonate  300 MG capsule TAKE ONE CAPSULE BY MOUTH THREE TIMES DAILY WITH MEALS 90 capsule 6  . valACYclovir (VALTREX) 1000 MG tablet Take 1 tablet (1,000 mg total) by mouth daily. 60 tablet 5   No current facility-administered medications for this visit.    Family History  Problem Relation Age of Onset  . Colon cancer Maternal Grandmother 80  . Heart disease Father   . Hypertension Father   . Thyroid disease Mother   . Osteoporosis Mother     ROS:  Pertinent items are noted in HPI.  Otherwise, a comprehensive ROS was negative.  Exam:   BP 110/68 mmHg  Pulse 64  Ht 5' 3.25" (1.607 m)  Wt 132 lb (59.875 kg)  BMI 23.19 kg/m2  LMP 03/16/2010 (Approximate) Height: 5' 3.25" (160.7 cm) Ht Readings from Last 3 Encounters:  11/29/14 5' 3.25" (1.607 m)  08/31/14 5' 2.75" (1.594 m)  02/12/14 5' 2.76" (1.594 m)    General appearance: alert, cooperative and appears stated age Head: Normocephalic, without obvious abnormality, atraumatic Neck: no adenopathy, supple, symmetrical, trachea midline and thyroid normal to inspection and palpation Lungs: clear to auscultation bilaterally Breasts: normal appearance, no masses or tenderness Heart: regular rate and rhythm Abdomen: soft, non-tender; no masses,  no organomegaly Extremities: extremities  normal, atraumatic, no cyanosis or edema Skin: Skin color, texture, turgor normal. No rashes or lesions Lymph nodes: Cervical, supraclavicular, and axillary nodes normal. No abnormal inguinal nodes palpated Neurologic: Grossly normal   Pelvic: External genitalia:  no lesions              Urethra:  normal appearing urethra with no masses, tenderness or lesions              Bartholin's and Skene's: normal                 Vagina: normal appearing vagina with normal color and discharge, no lesions              Cervix: anteverted              Pap taken: No. Bimanual Exam:  Uterus:  normal size, contour, position, consistency, mobility, non-tender               Adnexa: no mass, fullness, tenderness               Rectovaginal: Confirms               Anus:  normal sphincter tone, no lesions  Chaperone present: no  A:  Well Woman with normal exam  Postmenopausal no HRT Osteopenia - borderline History of HSV 1989 [place]: osteoporosis with mother  Plan: Reviewed health and wellness pertinent to exam  Pap smear as above  Mammogram is due now and is scheduled  Referral to Ileana Roup for pelvic floor relaxation.  Counseled on breast self exam, mammography screening, adequate intake of calcium and vitamin D, diet and exercise return annually or prn  An After Visit Summary was printed and given to the patient.

## 2014-11-29 NOTE — Patient Instructions (Signed)

## 2014-11-30 ENCOUNTER — Telehealth: Payer: Self-pay | Admitting: Nurse Practitioner

## 2014-11-30 NOTE — Telephone Encounter (Signed)
Left voicemail regarding referral appointment. The information is listed below. Should the patient need to cancel or reschedule this appointment, please advise them to call the office they've been referred to in order to reschedule.  [facility] [place] 2nd floor [phone]  12/15/14 @ 10am with Ileana Roup Please arrive 15 minutes early. Bring insurance card and photo id.

## 2014-12-02 NOTE — Telephone Encounter (Signed)
Patient called back information for referral given, she said she cannot make that appointment she will give [facility] a call to reschedule.

## 2014-12-05 NOTE — Progress Notes (Signed)
Encounter reviewed by Dr. Brook Amundson C. Silva.  

## 2015-02-24 ENCOUNTER — Other Ambulatory Visit: Payer: Self-pay | Admitting: Family Medicine

## 2015-03-11 ENCOUNTER — Encounter: Payer: Self-pay | Admitting: Family Medicine

## 2015-03-11 ENCOUNTER — Ambulatory Visit (INDEPENDENT_AMBULATORY_CARE_PROVIDER_SITE_OTHER): Payer: BC Managed Care – PPO | Admitting: Family Medicine

## 2015-03-11 VITALS — BP 112/73 | HR 70 | Temp 97.8°F | Ht 63.25 in | Wt 135.0 lb

## 2015-03-11 DIAGNOSIS — E559 Vitamin D deficiency, unspecified: Secondary | ICD-10-CM | POA: Diagnosis not present

## 2015-03-11 DIAGNOSIS — J301 Allergic rhinitis due to pollen: Secondary | ICD-10-CM | POA: Diagnosis not present

## 2015-03-11 DIAGNOSIS — M47816 Spondylosis without myelopathy or radiculopathy, lumbar region: Secondary | ICD-10-CM

## 2015-03-11 DIAGNOSIS — H9201 Otalgia, right ear: Secondary | ICD-10-CM

## 2015-03-11 DIAGNOSIS — F3130 Bipolar disorder, current episode depressed, mild or moderate severity, unspecified: Secondary | ICD-10-CM

## 2015-03-11 DIAGNOSIS — E785 Hyperlipidemia, unspecified: Secondary | ICD-10-CM

## 2015-03-11 NOTE — Progress Notes (Signed)
Subjective:    Patient ID: [name], female    DOB: [birth year], 60 y.o.   MRN: [id number]  HPI Patient is here today for a 6 month follow up on her chronic medical problems. Patient also is complaining with Right ear stopped up and left lower back pain. The patient is doing well overall. She continues to take her lithium regularly and vitamin D3 regularly and when necessary ibuprofen. She does complain today of some back pain in the left low back area and some ear pain. She has taken her lithium this morning. Her last colonoscopy was done in July 2015 and she will need a repeat colonoscopy in 5 years from that time. Patient denies chest pain shortness of breath trouble swallowing heartburn indigestion nausea vomiting diarrhea or blood in the stool or black tarry bowel movements. She is passing her water well. Her mood is good and the medicine appears to be working well for her. 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   Review of Systems  Constitutional: Negative.   HENT: Positive for ear pain.   Eyes: Negative.   Respiratory: Negative.   Cardiovascular: Negative.   Gastrointestinal: Negative.   Endocrine: Negative.   Genitourinary: Negative.   Musculoskeletal: Positive for back pain.       Left lower back   Skin: Negative.   Allergic/Immunologic: Negative.   Neurological: Negative.   Hematological: Negative.   Psychiatric/Behavioral: Negative.         Patient Active Problem List   Diagnosis Date Noted  . Osteopenia 10/14/2013  . Vitamin D deficiency 03/23/2013  . Bipolar I disorder, most recent episode (or current) depressed ([place])   . Benign neoplasm of colon   . Headache(784.0)   . Other atopic dermatitis and related conditions   . Hyperlipidemia    Outpatient Encounter Prescriptions as of 03/11/2015  Medication Sig  . CALCIUM PO Take 1 tablet by mouth daily.   . Cholecalciferol (VITAMIN D) 2000 UNITS CAPS Take by mouth.  Marland Kitchen ibuprofen (ADVIL,MOTRIN) 800  MG tablet TAKE 1 TABLET (800 MG TOTAL)   BY MOUTH EVERY 8 (EIGHT) HOURS AS NEEDED .  Marland Kitchen lithium carbonate 300 MG capsule TAKE ONE CAPSULE BY MOUTH THREE TIMES DAILY WITH MEALS  . valACYclovir (VALTREX) 1000 MG tablet Take 1 tablet (1,000 mg total) by mouth daily.   No facility-administered encounter medications on file as of 03/11/2015.       Objective:   Physical Exam  Constitutional: She is oriented to person, place, and time. She appears well-developed and well-nourished. No distress.  HENT:  Head: Normocephalic and atraumatic.  Right Ear: External ear normal.  Left Ear: External ear normal.  Mouth/Throat: Oropharynx is clear and moist.  Nasal congestion and turbinate swelling bilaterally and throat was clear.  Eyes: Conjunctivae and EOM are normal. Pupils are equal, round, and reactive to light. Right eye exhibits no discharge. Left eye exhibits no discharge. No scleral icterus.  Neck: Normal range of motion. Neck supple. No thyromegaly present.  No anterior cervical nodes or carotid bruits  Cardiovascular: Normal rate, regular rhythm, normal heart sounds and intact distal pulses.   No murmur heard. Pulmonary/Chest: Effort normal and breath sounds normal. No respiratory distress. She has no wheezes. She has no rales. She exhibits no tenderness.  Abdominal: Soft. Bowel sounds are normal. She exhibits no mass. There is no tenderness. There is no rebound and no guarding.  No organ enlargement palpable  Musculoskeletal: Normal range of motion. She  exhibits no edema.  Lymphadenopathy:    She has no cervical adenopathy.  Neurological: She is alert and oriented to person, place, and time. She has normal reflexes. No cranial nerve deficit.  Skin: Skin is warm and dry. No rash noted.  Psychiatric: She has a normal mood and affect. Her behavior is normal. Judgment and thought content normal.  Nursing note and vitals reviewed.   BP 112/73 mmHg  Pulse 70  Temp(Src) 97.8 F (36.6 C) (Oral)   Ht 5' 3.25" (1.607 m)  Wt 135 lb (61.236 kg)  BMI 23.71 kg/m2  LMP 03/16/2010 (Approximate)       Assessment & Plan:  1. Bipolar I disorder, most recent episode (or current) depressed ([place]) -Continue current lithium dose pending results of lab work - Lithium level  2. Hyperlipidemia -Continue with aggressive therapeutic lifestyle changes pending results of lab work - Lipid panel - BMP8+EGFR - Hepatic function panel - CBC with Differential/Platelet  3. Vitamin D deficiency -Continue vitamin D replacement pending results of lab work - VITAMIN D 25 Hydroxy (Vit-D Deficiency, Fractures) - CBC with Differential/Platelet  4. Allergic rhinitis due to pollen -Use nasal saline and Flonase as directed on handout she  5. Right ear pain -No significant findings which would contribute to this. She does appear to have allergic rhinitis and has had a recent cold.  6. Spondylosis of lumbar region without myelopathy or radiculopathy -Continue with ibuprofen and make sure that is taken after eating  Patient Instructions  Continue current medications. Continue good therapeutic lifestyle changes which include good diet and exercise. Fall precautions discussed with patient. If an FOBT was given today- please return it to our front desk. If you are over 58 years old - you may need Prevnar 70 or the adult Pneumonia vaccine.  Flu Shots will be available at our office starting mid- September. Please call and schedule a FLU CLINIC APPOINTMENT.   Continue to use ibuprofen as needed for back pain We will giving you a copy of the previous x-ray report which showed scoliosis and mild osteoarthritic change. Avoid heavy lifting pushing pulling Avoid falls as much as possible Continue with current medication We will call with results of lab work as soon as they become available Consider using nasal saline nasal saline gel Mucinex and Flonase as directed on handout sheet Drink plenty of fluids and  stay well hydrated   Arrie Senate MD

## 2015-03-11 NOTE — Patient Instructions (Addendum)
Continue current medications. Continue good therapeutic lifestyle changes which include good diet and exercise. Fall precautions discussed with patient. If an FOBT was given today- please return it to our front desk. If you are over 58 years old - you may need Prevnar 6 or the adult Pneumonia vaccine.  Flu Shots will be available at our office starting mid- September. Please call and schedule a FLU CLINIC APPOINTMENT.   Continue to use ibuprofen as needed for back pain We will giving you a copy of the previous x-ray report which showed scoliosis and mild osteoarthritic change. Avoid heavy lifting pushing pulling Avoid falls as much as possible Continue with current medication We will call with results of lab work as soon as they become available Consider using nasal saline nasal saline gel Mucinex and Flonase as directed on handout sheet Drink plenty of fluids and stay well hydrated

## 2015-03-12 LAB — BMP8+EGFR
BUN/Creatinine Ratio: 15 (ref 9–23)
BUN: 12 mg/dL (ref 6–24)
CALCIUM: 10.4 mg/dL — AB (ref 8.7–10.2)
CHLORIDE: 103 mmol/L (ref 96–106)
CO2: 21 mmol/L (ref 18–29)
Creatinine, Ser: 0.78 mg/dL (ref 0.57–1.00)
GFR calc Af Amer: 98 mL/min/{1.73_m2} (ref >59)
GFR calc non Af Amer: 85 mL/min/{1.73_m2} (ref >59)
Glucose: 86 mg/dL (ref 65–99)
POTASSIUM: 4.3 mmol/L (ref 3.5–5.2)
Sodium: 137 mmol/L (ref 134–144)

## 2015-03-12 LAB — HEPATIC FUNCTION PANEL
ALBUMIN: 4.2 g/dL (ref 3.5–5.5)
ALT: 9 IU/L (ref 0–32)
AST: 13 IU/L (ref 0–40)
Alkaline Phosphatase: 53 IU/L (ref 39–117)
Bilirubin Total: 0.6 mg/dL (ref 0.0–1.2)
Bilirubin, Direct: 0.13 mg/dL (ref 0.00–0.40)
TOTAL PROTEIN: 6.5 g/dL (ref 6.0–8.5)

## 2015-03-12 LAB — CBC WITH DIFFERENTIAL/PLATELET
BASOS ABS: 0 10*3/uL (ref 0.0–0.2)
Basos: 0 %
EOS (ABSOLUTE): 0.1 10*3/uL (ref 0.0–0.4)
EOS: 2 %
Hematocrit: 39.1 % (ref 34.0–46.6)
Hemoglobin: 12.9 g/dL (ref 11.1–15.9)
IMMATURE GRANULOCYTES: 0 %
Immature Grans (Abs): 0 10*3/uL (ref 0.0–0.1)
LYMPHS ABS: 1.2 10*3/uL (ref 0.7–3.1)
LYMPHS: 20 %
MCH: 28.4 pg (ref 26.6–33.0)
MCHC: 33 g/dL (ref 31.5–35.7)
MCV: 86 fL (ref 79–97)
MONOS ABS: 0.3 10*3/uL (ref 0.1–0.9)
Monocytes: 5 %
NEUTROS PCT: 73 %
Neutrophils Absolute: 4.4 10*3/uL (ref 1.4–7.0)
PLATELETS: 270 10*3/uL (ref 150–379)
RBC: 4.55 x10E6/uL (ref 3.77–5.28)
RDW: 15.1 % (ref 12.3–15.4)
WBC: 6 10*3/uL (ref 3.4–10.8)

## 2015-03-12 LAB — LIPID PANEL
CHOL/HDL RATIO: 3.5 ratio (ref 0.0–4.4)
CHOLESTEROL TOTAL: 176 mg/dL (ref 100–199)
HDL: 51 mg/dL (ref >39)
LDL CALC: 103 mg/dL — AB (ref 0–99)
Triglycerides: 110 mg/dL (ref 0–149)
VLDL Cholesterol Cal: 22 mg/dL (ref 5–40)

## 2015-03-12 LAB — VITAMIN D 25 HYDROXY (VIT D DEFICIENCY, FRACTURES): Vit D, 25-Hydroxy: 62.6 ng/mL (ref 30.0–100.0)

## 2015-03-12 LAB — LITHIUM LEVEL: LITHIUM LVL: 1.1 mmol/L (ref 0.6–1.4)

## 2015-04-12 ENCOUNTER — Other Ambulatory Visit: Payer: Self-pay | Admitting: Family Medicine

## 2015-04-12 ENCOUNTER — Telehealth: Payer: Self-pay | Admitting: Family Medicine

## 2015-04-12 MED ORDER — LITHIUM CARBONATE 300 MG PO CAPS: ORAL_CAPSULE | ORAL | Status: DC

## 2015-04-12 MED ORDER — IBUPROFEN 800 MG PO TABS: ORAL_TABLET | ORAL | Status: DC

## 2015-04-12 NOTE — Telephone Encounter (Signed)
Pt aware  - sent rf

## 2015-04-12 NOTE — Telephone Encounter (Signed)
done

## 2015-06-20 ENCOUNTER — Encounter: Payer: Self-pay | Admitting: *Deleted

## 2015-08-25 ENCOUNTER — Encounter: Payer: Self-pay | Admitting: *Deleted

## 2015-08-30 ENCOUNTER — Encounter: Payer: Self-pay | Admitting: Family Medicine

## 2015-08-30 ENCOUNTER — Ambulatory Visit (INDEPENDENT_AMBULATORY_CARE_PROVIDER_SITE_OTHER): Payer: BC Managed Care – PPO | Admitting: Family Medicine

## 2015-08-30 ENCOUNTER — Encounter (INDEPENDENT_AMBULATORY_CARE_PROVIDER_SITE_OTHER): Payer: Self-pay

## 2015-08-30 VITALS — BP 114/75 | HR 74 | Temp 97.1°F | Ht 63.25 in | Wt 138.5 lb

## 2015-08-30 DIAGNOSIS — R002 Palpitations: Secondary | ICD-10-CM | POA: Diagnosis not present

## 2015-08-30 DIAGNOSIS — F3130 Bipolar disorder, current episode depressed, mild or moderate severity, unspecified: Secondary | ICD-10-CM

## 2015-08-30 NOTE — Patient Instructions (Addendum)
Great to meet you!  Please come back if these are persistent or if you have any additional worrisome symptoms.    Palpitations A palpitation is the feeling that your heartbeat is irregular or is faster than normal. It may feel like your heart is fluttering or skipping a beat. Palpitations are usually not a serious problem. However, in some cases, you may need further medical evaluation. CAUSES  Palpitations can be caused by:  Smoking.  Caffeine or other stimulants, such as diet pills or energy drinks.  Alcohol.  Stress and anxiety.  Strenuous physical activity.  Fatigue.  Certain medicines.  Heart disease, especially if you have a history of irregular heart rhythms (arrhythmias), such as atrial fibrillation, atrial flutter, or supraventricular tachycardia.  An improperly working pacemaker or defibrillator. DIAGNOSIS  To find the cause of your palpitations, your health care provider will take your medical history and perform a physical exam. Your health care provider may also have you take a test called an ambulatory electrocardiogram (ECG). An ECG records your heartbeat patterns over a 24-hour period. You may also have other tests, such as:  Transthoracic echocardiogram (TTE). During echocardiography, sound waves are used to evaluate how blood flows through your heart.  Transesophageal echocardiogram (TEE).  Cardiac monitoring. This allows your health care provider to monitor your heart rate and rhythm in real time.  Holter monitor. This is a portable device that records your heartbeat and can help diagnose heart arrhythmias. It allows your health care provider to track your heart activity for several days, if needed.  Stress tests by exercise or by giving medicine that makes the heart beat faster. TREATMENT  Treatment of palpitations depends on the cause of your symptoms and can vary greatly. Most cases of palpitations do not require any treatment other than time, relaxation,  and monitoring your symptoms. Other causes, such as atrial fibrillation, atrial flutter, or supraventricular tachycardia, usually require further treatment. HOME CARE INSTRUCTIONS   Avoid:  Caffeinated coffee, tea, soft drinks, diet pills, and energy drinks.  Chocolate.  Alcohol.  Stop smoking if you smoke.  Reduce your stress and anxiety. Things that can help you relax include:  A method of controlling things in your body, such as your heartbeats, with your mind (biofeedback).  Yoga.  Meditation.  Physical activity such as swimming, jogging, or walking.  Get plenty of rest and sleep. SEEK MEDICAL CARE IF:   You continue to have a fast or irregular heartbeat beyond 24 hours.  Your palpitations occur more often. SEEK IMMEDIATE MEDICAL CARE IF:  You have chest pain or shortness of breath.  You have a severe headache.  You feel dizzy or you faint. MAKE SURE YOU:  Understand these instructions.  Will watch your condition.  Will get help right away if you are not doing well or get worse.   This information is not intended to replace advice given to you by your health care provider. Make sure you discuss any questions you have with your health care provider.   Document Released: 12/30/1999 Document Revised: 01/06/2013 Document Reviewed: 03/02/2011 Elsevier Interactive Patient Education [employer].

## 2015-08-30 NOTE — Progress Notes (Signed)
   HPI  Patient presents today here with palpitations and bipolar disorder.  Patient claims that she's been on lithium for several years. Over the last 2 days she's had 3 or 4 episodes of racing heartbeat that lasts 1 minute or less. She takes deep breaths during this episode but denies dyspnea, chest pain, sweating, or lightheadedness.  She is concerned because her old psychiatrist sees to ask her all the time that she was having racing heart beat.  Patient states that her mood is good, she denies any symptoms of mania or depression. She has good medication compliance.  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  PMH: Smoking status noted ROS: Per HPI  Objective: BP 114/75   Pulse 74   Temp 97.1 F (36.2 C) (Oral)   Ht 5' 3.25" (1.607 m)   Wt 138 lb 8 oz (62.8 kg)   LMP 03/16/2010 (Approximate)   BMI 24.34 kg/m  Gen: NAD, alert, cooperative with exam HEENT: NCAT CV: RRR, good S1/S2, no murmur Resp: CTABL, no wheezes, non-labored Ext: No edema, warm Neuro: Alert and oriented, No gross deficits  Psych: Slightly pressured speech Appropriate mood and affect   EKG- NSR  Assessment and plan:  # Bipolar disorder Stable to slightly up in Mood, No clear mania Continue lithium, checking level today Mood seems stable to slightly up, however she does not appear to be in florid mania. Consider adding abilify  # Palpitations At this point they sound benign. EKG is normal. Checking lithium level, as well as TSH and BMP. Reassurance provided, and maintain low threshold for return with treatment with lithium. If she needs to be transitioning to additional mood stabilizer would consider psychiatry referral. Consider Holter monitor versus cardiology referral if persistent or changes.    Orders Placed This Encounter  Procedures  . Lithium level  . BMP8+EGFR  . EKG 12-Lead    Laroy Apple, MD [facility] 08/30/2015, 9:29 AM

## 2015-08-31 LAB — BMP8+EGFR
BUN / CREAT RATIO: 9 (ref 9–23)
BUN: 7 mg/dL (ref 6–24)
CALCIUM: 10.4 mg/dL — AB (ref 8.7–10.2)
CO2: 23 mmol/L (ref 18–29)
CREATININE: 0.8 mg/dL (ref 0.57–1.00)
Chloride: 107 mmol/L — ABNORMAL HIGH (ref 96–106)
GFR calc Af Amer: 94 mL/min/{1.73_m2} (ref >59)
GFR calc non Af Amer: 82 mL/min/{1.73_m2} (ref >59)
GLUCOSE: 91 mg/dL (ref 65–99)
Potassium: 4.2 mmol/L (ref 3.5–5.2)
Sodium: 144 mmol/L (ref 134–144)

## 2015-08-31 LAB — LITHIUM LEVEL: Lithium Lvl: 0.8 mmol/L (ref 0.6–1.2)

## 2015-08-31 LAB — TSH: TSH: 1.39 u[IU]/mL (ref 0.450–4.500)

## 2015-09-02 ENCOUNTER — Telehealth: Payer: Self-pay | Admitting: Family Medicine

## 2015-09-05 NOTE — Telephone Encounter (Signed)
She was just seen with you 08/30/15. Does she still ntbs?

## 2015-09-05 NOTE — Telephone Encounter (Signed)
Ok to try melatonin, 5 mg before bed.   Needs to be seen for further medications.   Laroy Apple, MD [facility] 09/05/2015, 12:03 PM

## 2015-09-09 ENCOUNTER — Ambulatory Visit: Payer: BC Managed Care – PPO | Admitting: Family Medicine

## 2015-09-15 ENCOUNTER — Encounter: Payer: Self-pay | Admitting: Family Medicine

## 2015-09-15 ENCOUNTER — Ambulatory Visit (INDEPENDENT_AMBULATORY_CARE_PROVIDER_SITE_OTHER): Payer: BC Managed Care – PPO | Admitting: Family Medicine

## 2015-09-15 VITALS — BP 106/68 | HR 65 | Temp 97.1°F | Ht 63.25 in | Wt 137.2 lb

## 2015-09-15 DIAGNOSIS — F3130 Bipolar disorder, current episode depressed, mild or moderate severity, unspecified: Secondary | ICD-10-CM | POA: Diagnosis not present

## 2015-09-15 MED ORDER — LITHIUM CARBONATE 300 MG PO CAPS: ORAL_CAPSULE | ORAL | 6 refills | Status: DC

## 2015-09-15 NOTE — Progress Notes (Signed)
   HPI  Patient presents today here for follow-up of bipolar disorder for refill of lithium.  Patient was seen about one week ago for palpitations, these have completely resolved.  She feels that her mood was agitated due to school starting back.  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  She reports no inappropriate lows or highs and states that she's been stable on lithium for many years.  PMH: Smoking status noted ROS: Per HPI  Objective: BP 106/68   Pulse 65   Temp 97.1 F (36.2 C) (Oral)   Ht 5' 3.25" (1.607 m)   Wt 137 lb 3.2 oz (62.2 kg)   LMP 03/16/2010 (Approximate)   BMI 24.11 kg/m  Gen: NAD, alert, cooperative with exam HEENT: NCAT CV: RRR, good S1/S2, no murmur Resp: CTABL, no wheezes, non-labored Ext: No edema, warm Neuro: Alert and oriented, No gross deficits  Assessment and plan:  # Bipolar disorder Well-controlled on lithium Recent levels were normal, renal function normal as well Return to clinic in 6 months for routine follow-up of bipolar disorder   Meds ordered this encounter  Medications  . lithium carbonate 300 MG capsule    Sig: TAKE ONE CAPSULE BY MOUTH THREE TIMES DAILY WITH MEALS    Dispense:  90 capsule    Refill:  [place], MD [facility] 09/15/2015, 8:13 AM

## 2015-09-15 NOTE — Patient Instructions (Signed)
Great to see you!   

## 2015-09-22 NOTE — Telephone Encounter (Signed)
Patient seen 08/31

## 2015-09-23 ENCOUNTER — Ambulatory Visit: Payer: BC Managed Care – PPO | Admitting: Family Medicine

## 2015-10-06 ENCOUNTER — Ambulatory Visit: Payer: BC Managed Care – PPO | Admitting: Family Medicine

## 2015-11-07 ENCOUNTER — Other Ambulatory Visit: Payer: Self-pay | Admitting: Nurse Practitioner

## 2015-11-07 ENCOUNTER — Other Ambulatory Visit: Payer: Self-pay | Admitting: Family Medicine

## 2015-11-07 DIAGNOSIS — Z1231 Encounter for screening mammogram for malignant neoplasm of breast: Secondary | ICD-10-CM

## 2015-11-09 ENCOUNTER — Telehealth: Payer: Self-pay | Admitting: Nurse Practitioner

## 2015-11-09 MED ORDER — VALACYCLOVIR HCL 1 G PO TABS: 1000.0000 mg | ORAL_TABLET | Freq: Every day | ORAL | 5 refills | Status: DC

## 2015-11-09 NOTE — Telephone Encounter (Signed)
Patient called and requested a refill on Valtrex to her pharmacy on file.

## 2015-11-09 NOTE — Telephone Encounter (Signed)
Med refill request: Valtrex 1000mg  daily Last AEX: 11/29/2014 Next AEX: 01/06/16 Last MMG (if hormonal med)  Refill authorized: Please Advise? Last filled 11/29/14

## 2015-11-10 NOTE — Telephone Encounter (Signed)
Left detailed message, ok per current dpr. Advised requested prescription has been filled, if any additional questions return call to office.   Routing to provider for final review. Patient is agreeable to disposition. Will close encounter.

## 2015-11-10 NOTE — Telephone Encounter (Signed)
patient returning your call °

## 2015-11-10 NOTE — Telephone Encounter (Signed)
Left message to call Dionisia Pacholski at [phone].  

## 2015-11-24 ENCOUNTER — Ambulatory Visit (INDEPENDENT_AMBULATORY_CARE_PROVIDER_SITE_OTHER): Payer: BC Managed Care – PPO | Admitting: Family Medicine

## 2015-11-24 ENCOUNTER — Encounter: Payer: Self-pay | Admitting: Family Medicine

## 2015-11-24 VITALS — BP 115/74 | HR 74 | Temp 98.1°F | Ht 63.0 in | Wt 138.0 lb

## 2015-11-24 DIAGNOSIS — H6501 Acute serous otitis media, right ear: Secondary | ICD-10-CM | POA: Diagnosis not present

## 2015-11-24 MED ORDER — FLUTICASONE PROPIONATE 50 MCG/ACT NA SUSP: 2.0000 | Freq: Every day | NASAL | 6 refills | Status: DC

## 2015-11-24 MED ORDER — AZITHROMYCIN 250 MG PO TABS: ORAL_TABLET | ORAL | 0 refills | Status: DC

## 2015-11-24 NOTE — Progress Notes (Signed)
   Subjective:    Patient ID: [name], female    DOB: 1955-12-18, 60 y.o.   MRN: [id number]  HPI weeklong history of ear feeling stopped up; now it's beginning to hurt. She's had no fever. She has taken Mucinex OTC.  Patient Active Problem List   Diagnosis Date Noted  . Osteopenia 10/14/2013  . Vitamin D deficiency 03/23/2013  . Bipolar I disorder, most recent episode (or current) depressed ([place])   . Benign neoplasm of colon   . Headache(784.0)   . Other atopic dermatitis and related conditions   . Hyperlipidemia    Outpatient Encounter Prescriptions as of 11/24/2015  Medication Sig  . CALCIUM PO Take 1 tablet by mouth daily.   . Cholecalciferol (VITAMIN D) 2000 UNITS CAPS Take by mouth.  Marland Kitchen ibuprofen (ADVIL,MOTRIN) 800 MG tablet TAKE 1 TABLET (800 MG TOTAL)   BY MOUTH EVERY 8 (EIGHT) HOURS AS NEEDED .  Marland Kitchen lithium carbonate 300 MG capsule TAKE ONE CAPSULE BY MOUTH THREE TIMES DAILY WITH MEALS  . valACYclovir (VALTREX) 1000 MG tablet Take 1 tablet (1,000 mg total) by mouth daily.   No facility-administered encounter medications on file as of 11/24/2015.       Review of Systems  Constitutional: Negative.   HENT: Positive for congestion and ear pain.   Respiratory: Negative.   Cardiovascular: Negative.        Objective:   Physical Exam  Constitutional: She appears well-developed and well-nourished.  HENT:  Right ear: TM is dull and does not move with Valsalva.  Cardiovascular: Normal rate and regular rhythm.   Pulmonary/Chest: Effort normal and breath sounds normal.   BP 115/74   Pulse 74   Temp 98.1 F (36.7 C) (Oral)   Ht 5\' 3"  (1.6 m)   Wt 138 lb (62.6 kg)   LMP 03/16/2010 (Approximate)   BMI 24.45 kg/m         Assessment & Plan:  1. Right acute serous otitis media, recurrence not specified May continue Mucinex. Z-Pak, take as directed. Flonase 1 puff right nostril daily  Wardell Honour MD

## 2015-12-02 ENCOUNTER — Ambulatory Visit: Payer: BC Managed Care – PPO | Admitting: Nurse Practitioner

## 2015-12-02 ENCOUNTER — Encounter: Payer: Self-pay | Admitting: Certified Nurse Midwife

## 2015-12-02 ENCOUNTER — Ambulatory Visit
Admission: RE | Admit: 2015-12-02 | Discharge: 2015-12-02 | Disposition: A | Discharge status: Home / Self Care | Payer: BC Managed Care – PPO | Source: Ambulatory Visit | Attending: Nurse Practitioner | Admitting: Nurse Practitioner

## 2015-12-02 ENCOUNTER — Ambulatory Visit (INDEPENDENT_AMBULATORY_CARE_PROVIDER_SITE_OTHER): Payer: BC Managed Care – PPO | Admitting: Certified Nurse Midwife

## 2015-12-02 VITALS — BP 118/80 | HR 70 | Resp 16 | Ht 62.75 in | Wt 137.0 lb

## 2015-12-02 DIAGNOSIS — Z01419 Encounter for gynecological examination (general) (routine) without abnormal findings: Secondary | ICD-10-CM | POA: Diagnosis not present

## 2015-12-02 DIAGNOSIS — Z0000 Encounter for general adult medical examination without abnormal findings: Secondary | ICD-10-CM

## 2015-12-02 DIAGNOSIS — Z1231 Encounter for screening mammogram for malignant neoplasm of breast: Secondary | ICD-10-CM

## 2015-12-02 DIAGNOSIS — Z124 Encounter for screening for malignant neoplasm of cervix: Secondary | ICD-10-CM | POA: Diagnosis not present

## 2015-12-02 LAB — POCT URINALYSIS DIPSTICK
Bilirubin, UA: NEGATIVE
GLUCOSE UA: NEGATIVE
Ketones, UA: NEGATIVE
LEUKOCYTES UA: NEGATIVE
NITRITE UA: NEGATIVE
Protein, UA: NEGATIVE
RBC UA: NEGATIVE
UROBILINOGEN UA: NEGATIVE
pH, UA: 5

## 2015-12-02 NOTE — Patient Instructions (Signed)

## 2015-12-02 NOTE — Progress Notes (Signed)
60 y.o. [id number] Widowed  Caucasian Fe here for annual exam. Menopausal no HRT.Denies vaginal bleeding or vaginal dryness. Sees PCP for aex and labs and allergy, lithium management. Recent visit for ear infection, feels still slightly full in ear. Please check. Saw Pelvic floor PT and working on exercises. No urinary leakage or bowel issues. No other health issues today.   Patient's last menstrual period was 03/16/2010 (approximate).          Sexually active: Yes.    The current method of family planning is post menopausal status.    Exercising: No.  exercise Smoker:  no  Health Maintenance: Pap:  11-24-13 neg HPV HR neg MMG:  11-29-14 category c density birads 1:neg Colonoscopy:  2015 polyp,f/u 24yrs BMD:   2015 TDaP:  2016 Shingles: 2013 Pneumonia: 2015 Hep C and HIV: not done Labs: poct urine-neg Self breast exam: done monthly   reports that she has never smoked. She has never used smokeless tobacco. She reports that she does not drink alcohol or use drugs.  Past Medical History:  Diagnosis Date  . 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 Bipolar I disorder, most recent episode (or current) depressed   . Headache(784.0)   . Other and unspecified hyperlipidemia   . Other atopic dermatitis and related conditions   . 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 CESAREAN SECTION     x 2  . WISDOM TOOTH EXTRACTION  age 77    Current Outpatient Prescriptions  Medication Sig Dispense Refill  . CALCIUM PO Take 1 tablet by mouth daily.     . Cholecalciferol (VITAMIN D) 2000 UNITS CAPS Take by mouth.    . fluticasone (FLONASE) 50 MCG/ACT nasal spray Place 2 sprays into both nostrils daily. 16 g 6  . ibuprofen (ADVIL,MOTRIN) 800 MG tablet TAKE 1 TABLET (800 MG TOTAL)   BY MOUTH EVERY 8 (EIGHT) HOURS AS NEEDED . 30 tablet 1  . lithium carbonate 300 MG capsule TAKE ONE CAPSULE BY MOUTH THREE TIMES DAILY WITH  MEALS 90 capsule 6  . valACYclovir (VALTREX) 1000 MG tablet Take 1 tablet (1,000 mg total) by mouth daily. 60 tablet 5   No current facility-administered medications for this visit.     Family History  Problem Relation Age of Onset  . Heart disease Father   . Hypertension Father   . Thyroid disease Mother   . Osteoporosis Mother   . Colon cancer Maternal Grandmother 60    ROS:  Pertinent items are noted in HPI.  Otherwise, a comprehensive ROS was negative.  Exam:   BP 118/80   Pulse 70   Resp 16   Ht 5' 2.75" (1.594 m)   Wt 137 lb (62.1 kg)   LMP 03/16/2010 (Approximate)   BMI 24.46 kg/m  Height: 5' 2.75" (159.4 cm) Ht Readings from Last 3 Encounters:  12/02/15 5' 2.75" (1.594 m)  11/24/15 5\' 3"  (1.6 m)  09/15/15 5' 3.25" (1.607 m)    General appearance: alert, cooperative and appears stated age Head: Normocephalic, without obvious abnormality, atraumatic Neck: no adenopathy, supple, symmetrical, trachea midline and thyroid normal to inspection and palpation and non-palpable Lungs: clear to auscultation bilaterally Breasts: normal appearance, no masses or tenderness, No nipple retraction or dimpling, No nipple discharge or bleeding, No axillary or supraclavicular adenopathy Heart: regular rate and rhythm Abdomen: soft, non-tender; no masses,  no organomegaly Extremities: extremities normal,  atraumatic, no cyanosis or edema Skin: Skin color, texture, turgor normal. No rashes or lesions Lymph nodes: Cervical, supraclavicular, and axillary nodes normal. No abnormal inguinal nodes palpated Neurologic: Grossly normal   Pelvic: External genitalia:  no lesions              Urethra:  normal appearing urethra with no masses, tenderness or lesions              Bartholin's and Skene's: normal                 Vagina: normal appearing vagina with normal color and discharge, no lesions              Cervix: no bleeding following Pap, no cervical motion tenderness and no lesions               Pap taken: Yes.   Bimanual Exam:  Uterus:  normal size, contour, position, consistency, mobility, non-tender              Adnexa: normal adnexa and no mass, fullness, tenderness               Rectovaginal: Confirms               Anus:  normal sphincter tone, no lesions  Chaperone present: yes  A:  Well Woman with normal exam  Menopausal no HRT  Bipolar with PCP management  Recent right ear infection with PCP treatment, no redness noted today  P:   Reviewed health and wellness pertinent to exam  Aware of need to evaluate if vaginal bleeding  Continue follow up with MD as indicated  Discussed no positive finding with right ear today. If continues will need to see PCP again.   Pap smear as above with HPV reflex   counseled on breast self exam, mammography screening, adequate intake of calcium and vitamin D, diet and exercise, Kegel's exercises  return annually or prn  An After Visit Summary was printed and given to the patient.

## 2015-12-05 LAB — IPS PAP TEST WITH REFLEX TO HPV

## 2015-12-06 NOTE — Progress Notes (Signed)
Encounter reviewed Jill Jertson, MD   

## 2016-01-06 ENCOUNTER — Ambulatory Visit: Payer: BC Managed Care – PPO | Admitting: Nurse Practitioner

## 2016-02-24 ENCOUNTER — Ambulatory Visit: Payer: BC Managed Care – PPO | Admitting: Family Medicine

## 2016-04-11 ENCOUNTER — Encounter: Payer: Self-pay | Admitting: Family Medicine

## 2016-04-11 ENCOUNTER — Ambulatory Visit (INDEPENDENT_AMBULATORY_CARE_PROVIDER_SITE_OTHER): Payer: BC Managed Care – PPO | Admitting: Family Medicine

## 2016-04-11 VITALS — BP 99/64 | HR 64 | Temp 97.0°F | Ht 62.72 in | Wt 141.4 lb

## 2016-04-11 DIAGNOSIS — F3130 Bipolar disorder, current episode depressed, mild or moderate severity, unspecified: Secondary | ICD-10-CM | POA: Diagnosis not present

## 2016-04-11 DIAGNOSIS — L989 Disorder of the skin and subcutaneous tissue, unspecified: Secondary | ICD-10-CM

## 2016-04-11 DIAGNOSIS — E559 Vitamin D deficiency, unspecified: Secondary | ICD-10-CM

## 2016-04-11 DIAGNOSIS — R0982 Postnasal drip: Secondary | ICD-10-CM

## 2016-04-11 DIAGNOSIS — E785 Hyperlipidemia, unspecified: Secondary | ICD-10-CM | POA: Diagnosis not present

## 2016-04-11 MED ORDER — LITHIUM CARBONATE 300 MG PO CAPS: ORAL_CAPSULE | ORAL | 6 refills | Status: DC

## 2016-04-11 MED ORDER — IBUPROFEN 800 MG PO TABS: ORAL_TABLET | ORAL | 1 refills | Status: DC

## 2016-04-11 NOTE — Progress Notes (Signed)
   HPI  Patient presents today here for follow-up chronic medical conditions and back pain.  Back pain Several months duration, off and on right-sided low back pain over the SI joint. No leg symptoms. No bowel or bladder dysfunction, salicylate, or leg weakness. NSAIDs help, using 800 Motrin only occasionally. Using Aleve sometimes to help at night.  Bipolar disorder Doing well, patient does prefer to not see a psychiatrist, however she is willing.  Patient is fasting today.  She also complains of some postnasal drip and feeling of fullness in the right ear  PMH: Smoking status noted ROS: Per HPI  Objective: BP 99/64   Pulse 64   Temp 97 F (36.1 C) (Oral)   Ht 5' 2.72" (1.593 m)   Wt 141 lb 6.4 oz (64.1 kg)   LMP 03/16/2010 (Approximate)   BMI 25.27 kg/m  Gen: NAD, alert, cooperative with exam HEENT: NCAT, oropharynx clear, nares with some swelling of the turbinates, TMs normal bilaterally CV: RRR, good S1/S2, no murmur Resp: CTABL, no wheezes, non-labored Ext: No edema, warm Neuro: Alert and oriented, No gross deficits MSK Mild tenderness to palpation over the SI joint on the right, no paraspinal muscle tenderness or midline lumbar spine tenderness to palpation  Skin Right palm with approximately 1 cm roughly circular slightly hyperkeratotic and erythematous lesion. Nontender, no fluctuance, no drainage or opening.  Assessment and plan:  # Bipolar 1 disorder Refill lithium, lithium level ordered, discussed with patient that it's in her best interest to have a psychiatrist long-term Stable  # Hyperlipidemia Repeat labs  # Skin lesion of hand Right hand with skin lesion that is sometimes indicative of syphilis, checking RPR.  Note that she does not have any other lesions consistent with this, however I think it is appropriate to go ahead and check thoroughly. Recommended and offered dermatology follow-up if lesion does not respond spontaneously  # Postnasal  drip New problem over the last few weeks, likely causing eustachian tube dysfunction as well. Recommended daily Zyrtec or restarting Flonase  Vitamin D deficiency Repeat, osteopenia as well  Orders Placed This Encounter  Procedures  . CMP14+EGFR  . CBC with Differential/Platelet  . Lipid panel  . RPR  . HIV antibody  . Lithium level  . VITAMIN D 25 Hydroxy (Vit-D Deficiency, Fractures)  . Ambulatory referral to Psychiatry    Referral Priority:   Routine    Referral Type:   Psychiatric    Referral Reason:   Specialty Services Required    Requested Specialty:   Psychiatry    Number of Visits Requested:   1    Meds ordered this encounter  Medications  . lithium carbonate 300 MG capsule    Sig: TAKE ONE CAPSULE BY MOUTH THREE TIMES DAILY WITH MEALS    Dispense:  90 capsule    Refill:  6  . ibuprofen (ADVIL,MOTRIN) 800 MG tablet    Sig: TAKE 1 TABLET (800 MG TOTAL)   BY MOUTH EVERY 8 (EIGHT) HOURS AS NEEDED .    Dispense:  30 tablet    Refill:  [place], MD [facility] 04/11/2016, 10:24 AM    ych

## 2016-04-11 NOTE — Patient Instructions (Addendum)
Great to see you!  Look over the list below I think it is in your best interest long term to have a psychiatrist  Start a daily zyrtec for your congestion.   We will call with labs within 1 week.   Your provider wants you to schedule an appointment with a Psychologist/Psychiatrist. The following list of offices requires the patient to call and make their own appointment, as there is information they need that only you can provide. Please feel free to choose form the following providers:  [facility] in [place]  [facility]  [phone] [place], [place]  (Scheduled through [facility]) Must call and do an interview for appointment. Sees Children / Accepts Medicaid  [facility] [place]  [address] [place]    [place], [place]  [phone] [place], [place] for Autism but does not treat it Sees Children / Accepts Medicaid  [facility]    [phone] [address], [place], [place] Medication management, substance abuse, bipolar, grief, family, marriage, OCD, anxiety, PTSD Sees children / Accepts Medicaid  [facility]    [phone] [address] [place], [place] children / Accepts [facility]  [facility]  [phone] [address] [place], [place]   Dr Lorenza Evangelist     [phone] [address], [place], [place]  Sees ADD & ADHD for treatment Accepts Medicaid  [facility]  [phone] Arlean Hopping, [place] for Autism Accepts [facility]  [facility]  [phone] [place], [place]  Does Adult ADD evaluations Does not accept Medicaid  [facility]   [phone] [place], [place]  children as young as 31 years old Accepts [facility]     [phone]    [place], [place] [address] Sees children Accepts Medicaid

## 2016-04-12 LAB — LIPID PANEL
CHOL/HDL RATIO: 3.1 ratio (ref 0.0–4.4)
Cholesterol, Total: 162 mg/dL (ref 100–199)
HDL: 53 mg/dL (ref >39)
LDL CALC: 88 mg/dL (ref 0–99)
Triglycerides: 103 mg/dL (ref 0–149)
VLDL CHOLESTEROL CAL: 21 mg/dL (ref 5–40)

## 2016-04-12 LAB — CMP14+EGFR
A/G RATIO: 1.6 (ref 1.2–2.2)
ALK PHOS: 56 IU/L (ref 39–117)
ALT: 13 IU/L (ref 0–32)
AST: 11 IU/L (ref 0–40)
Albumin: 3.9 g/dL (ref 3.5–5.5)
BILIRUBIN TOTAL: 0.2 mg/dL (ref 0.0–1.2)
BUN / CREAT RATIO: 14 (ref 9–23)
BUN: 10 mg/dL (ref 6–24)
CHLORIDE: 107 mmol/L — AB (ref 96–106)
CO2: 22 mmol/L (ref 18–29)
Calcium: 9.6 mg/dL (ref 8.7–10.2)
Creatinine, Ser: 0.74 mg/dL (ref 0.57–1.00)
GFR calc non Af Amer: 90 mL/min/{1.73_m2} (ref >59)
GFR, EST AFRICAN AMERICAN: 103 mL/min/{1.73_m2} (ref >59)
GLUCOSE: 84 mg/dL (ref 65–99)
Globulin, Total: 2.5 g/dL (ref 1.5–4.5)
POTASSIUM: 4.4 mmol/L (ref 3.5–5.2)
Sodium: 142 mmol/L (ref 134–144)
Total Protein: 6.4 g/dL (ref 6.0–8.5)

## 2016-04-12 LAB — CBC WITH DIFFERENTIAL/PLATELET
BASOS: 0 %
Basophils Absolute: 0 10*3/uL (ref 0.0–0.2)
EOS (ABSOLUTE): 0.1 10*3/uL (ref 0.0–0.4)
Eos: 2 %
HEMOGLOBIN: 11.6 g/dL (ref 11.1–15.9)
Hematocrit: 37.1 % (ref 34.0–46.6)
IMMATURE GRANS (ABS): 0 10*3/uL (ref 0.0–0.1)
Immature Granulocytes: 0 %
LYMPHS: 19 %
Lymphocytes Absolute: 1.1 10*3/uL (ref 0.7–3.1)
MCH: 26.9 pg (ref 26.6–33.0)
MCHC: 31.3 g/dL — ABNORMAL LOW (ref 31.5–35.7)
MCV: 86 fL (ref 79–97)
MONOCYTES: 5 %
Monocytes Absolute: 0.3 10*3/uL (ref 0.1–0.9)
NEUTROS ABS: 4.3 10*3/uL (ref 1.4–7.0)
Neutrophils: 74 %
Platelets: 256 10*3/uL (ref 150–379)
RBC: 4.32 x10E6/uL (ref 3.77–5.28)
RDW: 15.6 % — ABNORMAL HIGH (ref 12.3–15.4)
WBC: 5.8 10*3/uL (ref 3.4–10.8)

## 2016-04-12 LAB — HIV ANTIBODY (ROUTINE TESTING W REFLEX): HIV Screen 4th Generation wRfx: NONREACTIVE

## 2016-04-12 LAB — RPR: RPR: NONREACTIVE

## 2016-04-12 LAB — VITAMIN D 25 HYDROXY (VIT D DEFICIENCY, FRACTURES): Vit D, 25-Hydroxy: 40.7 ng/mL (ref 30.0–100.0)

## 2016-04-12 LAB — LITHIUM LEVEL: Lithium Lvl: 0.8 mmol/L (ref 0.6–1.2)

## 2016-04-20 ENCOUNTER — Ambulatory Visit: Payer: BC Managed Care – PPO | Admitting: Family Medicine

## 2016-06-21 ENCOUNTER — Telehealth (HOSPITAL_COMMUNITY): Payer: Self-pay | Admitting: *Deleted

## 2016-06-21 NOTE — Telephone Encounter (Signed)
phone call regarding an appointment,  left voice message.

## 2016-06-26 ENCOUNTER — Telehealth: Payer: Self-pay | Admitting: Family Medicine

## 2016-06-26 ENCOUNTER — Other Ambulatory Visit: Payer: Self-pay | Admitting: *Deleted

## 2016-06-26 DIAGNOSIS — F3130 Bipolar disorder, current episode depressed, mild or moderate severity, unspecified: Secondary | ICD-10-CM

## 2016-06-26 NOTE — Telephone Encounter (Signed)
She was advised to continue speaking with Behavioral health to schedule with a provider suitable for her.

## 2016-06-28 NOTE — Progress Notes (Signed)
*Psychiatric Initial Adult Assessment   Patient Identification: [name] MRN:  [id number] Date of Evaluation:  07/04/2016 Referral Source: Timmothy Euler, MD Chief Complaint:   Chief Complaint    New Evaluation; Other     Visit Diagnosis:    ICD-10-CM   1. Bipolar I disorder ([place]) F31.9     History of Present Illness:   [name] is a 61 year old female with history of bipolar I disorder per chart, dyslipidemia, who is referred for bipolar disorder.   She states that she was referred here by Dr. Wendi Snipes because she has bipolar disorder. She has not had any episodes over years while continuing lithium. She denies any side effect which includes tremors or confusion. She states that she is doing relatively well, and is looking forward to have grandchildren sometime in the future. Her son lives with her, who will be moving out in several months and will get married. She is busy working at [employer]. She lives with her boyfriend in [place] and reports good relationship. Her husband deceased when he was 9 year old from [place], but she denies significant grief.   She denies insomnia. She denies fatigue or decreased concentration. She denies anhedonia and enjoys being with her boyfriend and her family. She denies SI, HI, AH, VH. She denies decreased need for sleep or euphoria. She denies increased goal-directed activity or impulsive behavior. She admits she tends to speak fast. She denies anxiety or panic attacks. She rarely drinks alcohol. She denies drug use. She states that the first mood episode was at age 74; she felt "high"; wanted to go dancing, did not sleep, stay busy for unknown period of time followed by depression, feeling like "the world is coming to an end." She denies drug use during these periods. She was admitted to [facility], then she was brought to another hospital in [facility]. She had another episode in around 1982 when she stopped taking medication. She was  IVC'd. She denies any episode since then.    Associated Signs/Symptoms: Depression Symptoms:  denies (Hypo) Manic Symptoms:  denies Anxiety Symptoms:  denies Psychotic Symptoms:  denies PTSD Symptoms: NA  Past Psychiatric History:  Outpatient: Dr. Ricardo Jericho, used to see him for five years Psychiatry admission: Drothea dix and she was transferred to another hospital in [place] at age 45, when she was "high" followed by depression. She was IVC'd around in 1982 when she stopped taking medication Previous suicide attempt: denies Past trials of medication: lithium  History of violence: denies  Previous Psychotropic Medications: Yes   Substance Abuse History in the last 12 months:  No.  Consequences of Substance Abuse: NA  Past Medical History:  Past Medical History:  Diagnosis Date  . 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 Bipolar I disorder, most recent episode (or current) depressed   . Headache(784.0)   . Other and unspecified hyperlipidemia   . Other atopic dermatitis and related conditions   . 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 CESAREAN SECTION     x 2  . WISDOM TOOTH EXTRACTION  age 33    Family Psychiatric History:  (Grandmother put house on fire)  Family History:  Family History  Problem Relation Age of Onset  . Heart disease Father   . Hypertension Father   . Thyroid disease Mother   . Osteoporosis Mother   .  Colon cancer Maternal Grandmother 60    Social History:   Social History   Social History  . Marital status: Widowed    Spouse name: N/A  . Number of children: 2  . Years of education: N/A   Occupational History  . [employer] History Main Topics  . Smoking status: Never Smoker  . Smokeless tobacco: Never Used  . Alcohol use No     Comment: 07-04-2016 Wine Occas.  . Drug use: No     Comment: 07-04-2016 per pt  no  . Sexual activity: Yes    Partners: Male    Birth control/ protection: Post-menopausal   Other Topics Concern  . None   Social History Narrative  . None    Additional Social History:  She is a widow, she has two children, age 92, 37, Her husband deceased in 05/08/08. She has a boyfriend who lives in [place],  She lives with her son.  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 Penicillins Rash    Metabolic Disorder Labs: No results found for: HGBA1C, MPG No results found for: PROLACTIN Lab Results  Component Value Date   CHOL 162 04/11/2016   TRIG 103 04/11/2016   HDL 53 04/11/2016   CHOLHDL 3.1 04/11/2016   LDLCALC 88 04/11/2016   LDLCALC 103 (H) 03/11/2015     Current Medications: Current Outpatient Prescriptions  Medication Sig Dispense Refill  . CALCIUM PO Take 1 tablet by mouth every 3 (three) days.     . Cholecalciferol (VITAMIN D) 2000 UNITS CAPS Take by mouth.    Marland Kitchen ibuprofen (ADVIL,MOTRIN) 800 MG tablet TAKE 1 TABLET (800 MG TOTAL)   BY MOUTH EVERY 8 (EIGHT) HOURS AS NEEDED . 30 tablet 1  . lithium carbonate 300 MG capsule TAKE ONE CAPSULE BY MOUTH THREE TIMES DAILY WITH MEALS 90 capsule 6  . valACYclovir (VALTREX) 1000 MG tablet Take 1 tablet (1,000 mg total) by mouth daily. 60 tablet 5   No current facility-administered medications for this visit.     Neurologic: Headache: No Seizure: No Paresthesias:No  Musculoskeletal: Strength & Muscle Tone: within normal limits Gait & Station: normal Patient leans: Right  Psychiatric Specialty Exam: ROS  Blood pressure 118/80, pulse 67, height 5' 2.72" (1.593 m), weight 140 lb 6.4 oz (63.7 kg), last menstrual period 03/16/2010, SpO2 97 %.Body mass index is 25.09 kg/m.  General Appearance: Well Groomed  Eye Contact:  Good  Speech:  Clear and  Coherent fast, but redirectable  Volume:  Normal  Mood:  "good"  Affect:  Appropriate, Congruent and euthymic  Thought Process:  Coherent and Goal Directed  Orientation:  Full (Time, Place, and Person)  Thought Content:  Logical Perceptions: denies AH/VH  Suicidal Thoughts:  No  Homicidal Thoughts:  No  Memory:  Immediate;   Good Recent;   Good Remote;   Good  Judgement:  Good  Insight:  Fair  Psychomotor Activity:  Normal  Concentration:  Concentration: Good and Attention Span: Good  Recall:  Good  Fund of Knowledge:Good  Language: Good  Akathisia:  No  Handed:  Right  AIMS (if indicated):  N/A  Assets:  Communication Skills Desire for Improvement  ADL's:  Intact  Cognition: WNL  Sleep:  good   Assessment Cybill B Blalock is a  61 year old female with history of bipolar I disorder per chart, dyslipidemia, who is referred for bipolar disorder.   # Bipolar I disorder Patient denies any significant mood symptoms on today's evaluation. Per patient report, she has been stable over many years, although she did have some manic episode (she does not remember details) in the past. She denies any side effect from lithium. Labs with no abnormality on thyroid, BMP in 03/2016. Lithium level is appropriate (0.8). Given patient has bene on same regimen, she is advised to get blood test at least once a year. Having discussed with patient, she will be followed by Dr. Wendi Snipes and will come to this clinic as needed. Discussed in length regarding typical prodromal symptoms of bipolar disorder.   Plan 1. Continue lithium 300 mg three times a day 2. Return to clinic as needed  The patient demonstrates the following risk factors for suicide: Chronic risk factors for suicide include: psychiatric disorder of bipolar disorder. Acute risk factors for suicide include: N/A. Protective factors for this patient include: positive social support, coping skills and hope for the future. Considering these factors,  the overall suicide risk at this point appears to be low. Patient is appropriate for outpatient follow up.   Treatment Plan Summaras abovePlan as above   Norman Clay, MD 6/20/20189:32 AM

## 2016-07-04 ENCOUNTER — Encounter (HOSPITAL_COMMUNITY): Payer: Self-pay | Admitting: Psychiatry

## 2016-07-04 ENCOUNTER — Ambulatory Visit (INDEPENDENT_AMBULATORY_CARE_PROVIDER_SITE_OTHER): Payer: BC Managed Care – PPO | Admitting: Psychiatry

## 2016-07-04 VITALS — BP 118/80 | HR 67 | Ht 62.72 in | Wt 140.4 lb

## 2016-07-04 DIAGNOSIS — F319 Bipolar disorder, unspecified: Secondary | ICD-10-CM | POA: Insufficient documentation

## 2016-07-04 DIAGNOSIS — E785 Hyperlipidemia, unspecified: Secondary | ICD-10-CM

## 2016-07-04 NOTE — Patient Instructions (Addendum)
1. Continue lithium 300 mg three times a day 2. Return to clinic as needed

## 2016-10-12 ENCOUNTER — Encounter: Payer: Self-pay | Admitting: Family Medicine

## 2016-10-12 ENCOUNTER — Ambulatory Visit (INDEPENDENT_AMBULATORY_CARE_PROVIDER_SITE_OTHER): Payer: BC Managed Care – PPO | Admitting: Family Medicine

## 2016-10-12 VITALS — BP 102/70 | HR 72 | Temp 97.8°F | Ht 62.72 in | Wt 140.0 lb

## 2016-10-12 DIAGNOSIS — D126 Benign neoplasm of colon, unspecified: Secondary | ICD-10-CM

## 2016-10-12 DIAGNOSIS — F3130 Bipolar disorder, current episode depressed, mild or moderate severity, unspecified: Secondary | ICD-10-CM

## 2016-10-12 MED ORDER — IBUPROFEN 800 MG PO TABS: ORAL_TABLET | ORAL | 1 refills | Status: DC

## 2016-10-12 MED ORDER — LITHIUM CARBONATE 300 MG PO CAPS: ORAL_CAPSULE | ORAL | 6 refills | Status: DC

## 2016-10-12 NOTE — Patient Instructions (Signed)
Great to see you!  Come back in 6 months unless you need us sooner.    

## 2016-10-12 NOTE — Progress Notes (Signed)
HPI  Patient presents today here to follow-up for bipolar 1 disorder.  Patient feels well, she denies episodes of mania or suicidal thoughts. Patient states that overall she feels very well and has no complaints. She did establish care and psychiatrist and plans to follow-up there as needed.  She states that she's had 2 colonoscopies, she had polyps both times and states that she's due for a colonoscopy when she turns 62.   PMH: Smoking status noted ROS: Per HPI  Objective: BP 102/70   Pulse 72   Temp 97.8 F (36.6 C) (Oral)   Ht 5' 2.72" (1.593 m)   Wt 140 lb (63.5 kg)   LMP 03/16/2010 (Approximate)   BMI 25.02 kg/m  Gen: NAD, alert, cooperative with exam HEENT: NCAT CV: RRR, good S1/S2, no murmur Resp: CTABL, no wheezes, non-labored Ext: No edema, warm Neuro: Alert and oriented, No gross deficits  Assessment and plan:  # Bipolar 1 disorder Appreciate psychiatry's recommendations Mood stable Continue lithium at current dose, checking levels, LFTs, and renal  # Benign neoplasm of the colon Patient due for colonoscopy next year, discussed    Orders Placed This Encounter  Procedures  . Lithium level  . CMP14+EGFR    Meds ordered this encounter  Medications  . lithium carbonate 300 MG capsule    Sig: TAKE ONE CAPSULE BY MOUTH THREE TIMES DAILY WITH MEALS    Dispense:  90 capsule    Refill:  6  . ibuprofen (ADVIL,MOTRIN) 800 MG tablet    Sig: TAKE 1 TABLET (800 MG TOTAL)   BY MOUTH EVERY 8 (EIGHT) HOURS AS NEEDED .    Dispense:  30 tablet    Refill:  Grant, MD [facility] 10/12/2016, 8:25 AM

## 2016-10-13 LAB — CMP14+EGFR
A/G RATIO: 1.8 (ref 1.2–2.2)
ALT: 11 IU/L (ref 0–32)
AST: 14 IU/L (ref 0–40)
Albumin: 4.4 g/dL (ref 3.5–5.5)
Alkaline Phosphatase: 65 IU/L (ref 39–117)
BUN/Creatinine Ratio: 14 (ref 9–23)
BUN: 10 mg/dL (ref 6–24)
Bilirubin Total: 0.3 mg/dL (ref 0.0–1.2)
CALCIUM: 9.8 mg/dL (ref 8.7–10.2)
CO2: 21 mmol/L (ref 20–29)
Chloride: 108 mmol/L — ABNORMAL HIGH (ref 96–106)
Creatinine, Ser: 0.73 mg/dL (ref 0.57–1.00)
GFR, EST AFRICAN AMERICAN: 104 mL/min/{1.73_m2} (ref >59)
GFR, EST NON AFRICAN AMERICAN: 90 mL/min/{1.73_m2} (ref >59)
GLOBULIN, TOTAL: 2.4 g/dL (ref 1.5–4.5)
Glucose: 91 mg/dL (ref 65–99)
POTASSIUM: 4.4 mmol/L (ref 3.5–5.2)
SODIUM: 142 mmol/L (ref 134–144)
TOTAL PROTEIN: 6.8 g/dL (ref 6.0–8.5)

## 2016-10-13 LAB — LITHIUM LEVEL: Lithium Lvl: 0.8 mmol/L (ref 0.6–1.2)

## 2016-10-22 ENCOUNTER — Other Ambulatory Visit: Payer: Self-pay | Admitting: Certified Nurse Midwife

## 2016-10-22 DIAGNOSIS — Z1231 Encounter for screening mammogram for malignant neoplasm of breast: Secondary | ICD-10-CM

## 2016-11-13 ENCOUNTER — Ambulatory Visit: Payer: Self-pay

## 2016-12-14 ENCOUNTER — Other Ambulatory Visit: Payer: Self-pay

## 2016-12-14 ENCOUNTER — Ambulatory Visit: Payer: BC Managed Care – PPO | Admitting: Nurse Practitioner

## 2016-12-14 ENCOUNTER — Encounter: Payer: Self-pay | Admitting: Certified Nurse Midwife

## 2016-12-14 ENCOUNTER — Ambulatory Visit: Payer: BC Managed Care – PPO | Admitting: Certified Nurse Midwife

## 2016-12-14 ENCOUNTER — Ambulatory Visit
Admission: RE | Admit: 2016-12-14 | Discharge: 2016-12-14 | Disposition: A | Discharge status: Home / Self Care | Payer: BC Managed Care – PPO | Source: Ambulatory Visit | Attending: Certified Nurse Midwife | Admitting: Certified Nurse Midwife

## 2016-12-14 VITALS — BP 130/80 | HR 74 | Resp 12 | Ht 62.25 in | Wt 141.2 lb

## 2016-12-14 DIAGNOSIS — N951 Menopausal and female climacteric states: Secondary | ICD-10-CM | POA: Diagnosis not present

## 2016-12-14 DIAGNOSIS — Z01419 Encounter for gynecological examination (general) (routine) without abnormal findings: Secondary | ICD-10-CM | POA: Diagnosis not present

## 2016-12-14 DIAGNOSIS — C4492 Squamous cell carcinoma of skin, unspecified: Secondary | ICD-10-CM

## 2016-12-14 DIAGNOSIS — Z1231 Encounter for screening mammogram for malignant neoplasm of breast: Secondary | ICD-10-CM

## 2016-12-14 HISTORY — DX: Squamous cell carcinoma of skin, unspecified: C44.92

## 2016-12-14 NOTE — Patient Instructions (Signed)

## 2016-12-14 NOTE — Progress Notes (Signed)
61 y.o. [id number] Widowed  Caucasian Fe here for annual exam. Menopausal no HRT. Denies vaginal bleeding or vaginal dryness. Partner recent stroke with vision loss. He is staying with family. Some night urination, but no change. Continues with Valtrex  Suppression with good results. Seeing Dr.Bradshaw for Lithium management and aex/labs. 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 Had mammogram prior to appointment. No health issues today. Planning a family history.  Patient's last menstrual period was 03/16/2010 (approximate).          Sexually active: Yes.    The current method of family planning is post menopausal status.    Exercising: Yes.    walking Smoker:  no  Health Maintenance: Pap:  11-24-13 neg HPV HR neg, 12-02-15 neg History of Abnormal Pap: no MMG:  Today, last mmg 12-02-15 category c density birads 1:neg  Self Breast exams: yes Colonoscopy:  2015 polyp f/u 71yrs BMD:   2015 normal, will schedule next year. TDaP:  2016 Shingles: 2018 Pneumonia: 2015 Hep C and HIV: HIV neg 2018 Labs: PCP   reports that  has never smoked. she has never used smokeless tobacco. She reports that she does not drink alcohol or use drugs.  Past Medical History:  Diagnosis Date  . 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 Bipolar I disorder, most recent episode (or current) depressed   . Headache(784.0)   . Other and unspecified hyperlipidemia   . Other atopic dermatitis and related conditions   . 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 CESAREAN SECTION     x 2  . WISDOM TOOTH EXTRACTION  age 35    Current Outpatient Medications  Medication Sig Dispense Refill  . CALCIUM PO Take 1 tablet by mouth every 3 (three) days.     . Cholecalciferol (VITAMIN D) 2000 UNITS CAPS Take by mouth.    Marland Kitchen ibuprofen (ADVIL,MOTRIN) 800 MG tablet TAKE 1 TABLET (800 MG TOTAL)   BY MOUTH EVERY 8 (EIGHT) HOURS AS NEEDED . 30 tablet 1   . lithium carbonate 300 MG capsule TAKE ONE CAPSULE BY MOUTH THREE TIMES DAILY WITH MEALS 90 capsule 6  . valACYclovir (VALTREX) 1000 MG tablet Take 1 tablet (1,000 mg total) by mouth daily. 60 tablet 5   No current facility-administered medications for this visit.     Family History  Problem Relation Age of Onset  . Heart disease Father   . Hypertension Father   . Thyroid disease Mother   . Osteoporosis Mother   . Colon cancer Maternal Grandmother 22  . Breast cancer Neg Hx     ROS:  Pertinent items are noted in HPI.  Otherwise, a comprehensive ROS was negative.  Exam:   BP 130/80 (BP Location: Right Arm, Patient Position: Sitting, Cuff Size: Normal)   Pulse 74   Resp 12   Ht 5' 2.25" (1.581 m)   Wt 141 lb 4 oz (64.1 kg)   LMP 03/16/2010 (Approximate)   BMI 25.63 kg/m  Height: 5' 2.25" (158.1 cm) Ht Readings from Last 3 Encounters:  12/14/16 5' 2.25" (1.581 m)  10/12/16 5' 2.72" (1.593 m)  04/11/16 5' 2.72" (1.593 m)    General appearance: alert, cooperative and appears stated age Head: Normocephalic, without obvious abnormality, atraumatic Neck: no adenopathy, supple, symmetrical, trachea midline and thyroid normal to inspection and palpation Lungs: clear to auscultation bilaterally Breasts: normal appearance, no masses or tenderness, No  nipple retraction or dimpling, No nipple discharge or bleeding, No axillary or supraclavicular adenopathy Heart: regular rate and rhythm Abdomen: soft, non-tender; no masses,  no organomegaly Extremities: extremities normal, atraumatic, no cyanosis or edema Skin: Skin color, texture, turgor normal. No rashes or lesions Lymph nodes: Cervical, supraclavicular, and axillary nodes normal. No abnormal inguinal nodes palpated Neurologic: Grossly normal   Pelvic: External genitalia:  no lesions              Urethra:  normal appearing urethra with no masses, tenderness or lesions              Bartholin's and Skene's: normal                  Vagina: normal appearing vagina with normal color and discharge, no lesions              Cervix: no cervical motion tenderness and no lesions              Pap taken: No. Bimanual Exam:  Uterus:  normal size, contour, position, consistency, mobility, non-tender              Adnexa: normal adnexa and no mass, fullness, tenderness               Rectovaginal: Confirms               Anus:  normal sphincter tone, no lesions  Chaperone present: yes  A:  Well Woman with normal exam  Menopausal no HRT  Bipolar on stable dose of Lithium with PCP management now    P:   Reviewed health and wellness pertinent to exam  Aware if vaginal bleeding will need to call.  Continue follow up with MD as indicated  Pap smear: no   counseled on breast self exam, mammography screening, feminine hygiene, adequate intake of calcium and vitamin D, diet and exercise  return annually or prn  An After Visit Summary was printed and given to the patient.

## 2017-01-28 ENCOUNTER — Other Ambulatory Visit: Payer: Self-pay | Admitting: Obstetrics & Gynecology

## 2017-01-28 ENCOUNTER — Other Ambulatory Visit: Payer: Self-pay | Admitting: Certified Nurse Midwife

## 2017-01-28 MED ORDER — VALACYCLOVIR HCL 1 G PO TABS: 1000.0000 mg | ORAL_TABLET | Freq: Every day | ORAL | 5 refills | Status: DC

## 2017-01-28 NOTE — Telephone Encounter (Signed)
Patient needing a refill for valacyclovir sent to [facility] at [phone].

## 2017-01-28 NOTE — Telephone Encounter (Signed)
Medication refill request: Valtrex  Last AEX:  12-14-16  Next AEX: 12-26-17  Last MMG (if hormonal medication request): 12-14-16 WNL  Refill authorized: please advise

## 2017-01-31 DIAGNOSIS — I83812 Varicose veins of left lower extremities with pain: Secondary | ICD-10-CM | POA: Insufficient documentation

## 2017-02-25 ENCOUNTER — Ambulatory Visit (INDEPENDENT_AMBULATORY_CARE_PROVIDER_SITE_OTHER): Payer: BC Managed Care – PPO

## 2017-02-25 ENCOUNTER — Ambulatory Visit: Payer: BC Managed Care – PPO | Admitting: Family Medicine

## 2017-02-25 ENCOUNTER — Ambulatory Visit: Payer: BC Managed Care – PPO | Admitting: Nurse Practitioner

## 2017-02-25 ENCOUNTER — Encounter: Payer: Self-pay | Admitting: Nurse Practitioner

## 2017-02-25 VITALS — BP 119/75 | HR 74 | Temp 97.0°F | Ht 62.0 in | Wt 144.0 lb

## 2017-02-25 DIAGNOSIS — R05 Cough: Secondary | ICD-10-CM

## 2017-02-25 DIAGNOSIS — R059 Cough, unspecified: Secondary | ICD-10-CM

## 2017-02-25 DIAGNOSIS — J069 Acute upper respiratory infection, unspecified: Secondary | ICD-10-CM

## 2017-02-25 MED ORDER — DOXYCYCLINE HYCLATE 100 MG PO TABS: 100.0000 mg | ORAL_TABLET | Freq: Two times a day (BID) | ORAL | 0 refills | Status: DC

## 2017-02-25 NOTE — Patient Instructions (Signed)

## 2017-02-25 NOTE — Progress Notes (Signed)
   Subjective:    Patient ID: [name], female    DOB: Apr 08, 1955, 61 y.o.   MRN: [id number]  HPI Patient comes in c/o cough and congestion. Cough is nonproductive. She feels worse today then she did yesterday. Has chills but denies fever.   Review of Systems  Constitutional: Positive for chills. Negative for fever.  HENT: Positive for congestion, ear pain and sinus pressure. Negative for sore throat and trouble swallowing.   Respiratory: Positive for cough (nonproductive).   Cardiovascular: Negative.   Gastrointestinal: Negative.   Genitourinary: Negative.   Neurological: Positive for headaches.  Psychiatric/Behavioral: Negative.   All other systems reviewed and are negative.      Objective:   Physical Exam  Constitutional: She is oriented to person, place, and time. She appears well-developed and well-nourished. She appears distressed (mild).  HENT:  Right Ear: Hearing, tympanic membrane, external ear and ear canal normal.  Left Ear: Hearing, tympanic membrane and external ear normal.  Nose: Mucosal edema and rhinorrhea present. Right sinus exhibits no maxillary sinus tenderness and no frontal sinus tenderness. Left sinus exhibits no maxillary sinus tenderness and no frontal sinus tenderness.  Mouth/Throat: Uvula is midline. Posterior oropharyngeal erythema (mild) present.  Cardiovascular: Normal rate and regular rhythm.  Pulmonary/Chest: Effort normal. No respiratory distress.  Deep wet cough with diminished breath sounds right lower lobe  Neurological: She is alert and oriented to person, place, and time.  Skin: Skin is warm.  Psychiatric: She has a normal mood and affect. Her behavior is normal. Judgment and thought content normal.     BP 119/75   Pulse 74   Temp (!) 97 F (36.1 C) (Oral)   Ht 5\' 2"  (1.575 m)   Wt 144 lb (65.3 kg)   LMP 03/16/2010 (Approximate)   BMI 26.34 kg/m   Chest xray- no cardiopulmonary disease-Preliminary reading by Ronnald Collum, FNP   [facility]     Assessment & Plan:  1. Cough - DG Chest 2 View; Future  2. Upper respiratory infection with cough and congestion 1. Take meds as prescribed 2. Use a cool mist humidifier especially during the winter months and when heat has been humid. 3. Use saline nose sprays frequently 4. Saline irrigations of the nose can be very helpful if done frequently.  * 4X daily for 1 week*  * Use of a nettie pot can be helpful with this. Follow directions with this* 5. Drink plenty of fluids 6. Keep thermostat turn down low 7.For any cough or congestion  Use plain Mucinex- regular strength or max strength is fine   * Children- consult with Pharmacist for dosing 8. For fever or aces or pains- take tylenol or ibuprofen appropriate for age and weight.  * for fevers greater than 101 orally you may alternate ibuprofen and tylenol every  3 hours.    - doxycycline (VIBRA-TABS) 100 MG tablet; Take 1 tablet (100 mg total) by mouth 2 (two) times daily. 1 po bid  Dispense: 20 tablet; Refill: 0  Mary-Margaret Hassell Done, FNP

## 2017-07-04 ENCOUNTER — Encounter: Payer: Self-pay | Admitting: Family Medicine

## 2017-07-04 ENCOUNTER — Ambulatory Visit (INDEPENDENT_AMBULATORY_CARE_PROVIDER_SITE_OTHER): Payer: BC Managed Care – PPO | Admitting: Family Medicine

## 2017-07-04 VITALS — BP 120/77 | HR 67 | Temp 97.5°F | Ht 62.0 in | Wt 142.6 lb

## 2017-07-04 DIAGNOSIS — H9201 Otalgia, right ear: Secondary | ICD-10-CM

## 2017-07-04 DIAGNOSIS — F3130 Bipolar disorder, current episode depressed, mild or moderate severity, unspecified: Secondary | ICD-10-CM

## 2017-07-04 DIAGNOSIS — E785 Hyperlipidemia, unspecified: Secondary | ICD-10-CM | POA: Diagnosis not present

## 2017-07-04 MED ORDER — IBUPROFEN 800 MG PO TABS: ORAL_TABLET | ORAL | 1 refills | Status: DC

## 2017-07-04 MED ORDER — LITHIUM CARBONATE 300 MG PO CAPS: ORAL_CAPSULE | ORAL | 6 refills | Status: DC

## 2017-07-04 MED ORDER — CIPROFLOXACIN-DEXAMETHASONE 0.3-0.1 % OT SUSP: 4.0000 [drp] | Freq: Two times a day (BID) | OTIC | 0 refills | Status: DC

## 2017-07-04 NOTE — Patient Instructions (Signed)
Great to see you!  Come back to see Glenard Haring in 6 months

## 2017-07-04 NOTE — Progress Notes (Signed)
   HPI  Patient presents today for follow-up chronic medical conditions as well as right ear pain.  Patient explains she has had right ear pain for 1 day, she tried some vinegar and alcohol in the ear to see if they can help.  She suspects that she may have gotten water in her ear while washing her hair.  She has not been swimming. She states her ear feels like it stopped up. She has mild sore throat, no cough, she is tolerating food and fluids like usual.  Good medication compliance with lithium, no SI, manic episodes, depressive episodes, or complications that she knows of.  She is been stable on lithium for quite a while.  PMH: Smoking status noted ROS: Per HPI  Objective: BP 120/77   Pulse 67   Temp (!) 97.5 F (36.4 C) (Oral)   Ht _0  (1.575 m)   Wt 142 lb 9.6 oz (64.7 kg)   LMP 03/16/2010 (Approximate)   BMI 26.08 kg/m  Gen: NAD, alert, cooperative with exam HEENT: NCAT, old erythema of the right external ear canal, left and right tympanic membranes normal, oropharynx moist and clear CV: RRR, good S1/S2, no murmur Resp: CTABL, no wheezes, non-labored Ext: No edema, warm Neuro: Alert and oriented, No gross deficits  Assessment and plan:  #Otalgia, right ear Possible otitis externa, she certainly has discomfort but her tympanic membrane is not changed. Ciprodex given  #Hyperlipidemia Mild No medications Labs, nonfasting so only LDL  #Bipolar 1 disorder Very stable, I have been prescribing her lithium and following labs every 6 months Return to clinic with any concerns otherwise follow-up in 6 months She is willing to see psychiatry if needed  Orders Placed This Encounter  Procedures  . CMP14+EGFR  . Lithium level  . LDL Cholesterol, Direct    Meds ordered this encounter  Medications  . ibuprofen (ADVIL,MOTRIN) 800 MG tablet    Sig: TAKE 1 TABLET (800 MG TOTAL)   BY MOUTH EVERY 8 (EIGHT) HOURS AS NEEDED .    Dispense:  30 tablet    Refill:  1  .  lithium carbonate 300 MG capsule    Sig: TAKE ONE CAPSULE BY MOUTH THREE TIMES DAILY WITH MEALS    Dispense:  90 capsule    Refill:  6  . ciprofloxacin-dexamethasone (CIPRODEX) OTIC suspension    Sig: Place 4 drops into the right ear 2 (two) times daily.    Dispense:  7.5 mL    Refill:  0    Laroy Apple, MD [facility] 07/04/2017, 3:13 PM

## 2017-07-08 LAB — CMP14+EGFR
ALK PHOS: 63 IU/L (ref 39–117)
ALT: 15 IU/L (ref 0–32)
AST: 12 IU/L (ref 0–40)
Albumin/Globulin Ratio: 1.7 (ref 1.2–2.2)
Albumin: 4.3 g/dL (ref 3.6–4.8)
BUN/Creatinine Ratio: 12 (ref 12–28)
BUN: 9 mg/dL (ref 8–27)
Bilirubin Total: 0.3 mg/dL (ref 0.0–1.2)
CO2: 20 mmol/L (ref 20–29)
CREATININE: 0.73 mg/dL (ref 0.57–1.00)
Calcium: 10.2 mg/dL (ref 8.7–10.3)
Chloride: 108 mmol/L — ABNORMAL HIGH (ref 96–106)
GFR calc Af Amer: 104 mL/min/{1.73_m2} (ref >59)
GFR calc non Af Amer: 90 mL/min/{1.73_m2} (ref >59)
GLUCOSE: 93 mg/dL (ref 65–99)
Globulin, Total: 2.5 g/dL (ref 1.5–4.5)
Potassium: 4 mmol/L (ref 3.5–5.2)
Sodium: 140 mmol/L (ref 134–144)
Total Protein: 6.8 g/dL (ref 6.0–8.5)

## 2017-07-08 LAB — LITHIUM LEVEL: LITHIUM LVL: 0.6 mmol/L (ref 0.6–1.2)

## 2017-07-08 LAB — LDL CHOLESTEROL, DIRECT: LDL DIRECT: 106 mg/dL — AB (ref 0–99)

## 2017-10-18 ENCOUNTER — Telehealth: Payer: Self-pay | Admitting: Family Medicine

## 2017-10-18 NOTE — Telephone Encounter (Signed)
Last seen June with Hildred Priest can you approve this request as back up for bradshaw?

## 2017-10-18 NOTE — Telephone Encounter (Signed)
Pharmacy [facility] pharmacy  Pt is getting ready to go on cruise wants to know if we can send the patches that go behind ear to pharmacy for sea sickness.

## 2017-10-20 ENCOUNTER — Other Ambulatory Visit: Payer: Self-pay | Admitting: Physician Assistant

## 2017-10-20 MED ORDER — SCOPOLAMINE 1 MG/3DAYS TD PT72: 1.0000 | MEDICATED_PATCH | TRANSDERMAL | 0 refills | Status: DC

## 2017-10-20 NOTE — Telephone Encounter (Signed)
sent 

## 2017-11-08 ENCOUNTER — Other Ambulatory Visit: Payer: Self-pay | Admitting: Certified Nurse Midwife

## 2017-11-08 DIAGNOSIS — Z1231 Encounter for screening mammogram for malignant neoplasm of breast: Secondary | ICD-10-CM

## 2017-12-18 ENCOUNTER — Ambulatory Visit
Admission: RE | Admit: 2017-12-18 | Discharge: 2017-12-18 | Disposition: A | Discharge status: Home / Self Care | Payer: BC Managed Care – PPO | Source: Ambulatory Visit | Attending: Certified Nurse Midwife | Admitting: Certified Nurse Midwife

## 2017-12-18 DIAGNOSIS — Z1231 Encounter for screening mammogram for malignant neoplasm of breast: Secondary | ICD-10-CM

## 2017-12-26 ENCOUNTER — Ambulatory Visit: Payer: Self-pay

## 2017-12-26 ENCOUNTER — Ambulatory Visit (INDEPENDENT_AMBULATORY_CARE_PROVIDER_SITE_OTHER): Payer: BC Managed Care – PPO | Admitting: Certified Nurse Midwife

## 2017-12-26 ENCOUNTER — Other Ambulatory Visit: Payer: Self-pay

## 2017-12-26 ENCOUNTER — Encounter: Payer: Self-pay | Admitting: Certified Nurse Midwife

## 2017-12-26 VITALS — BP 120/80 | HR 70 | Resp 16 | Ht 62.75 in | Wt 147.0 lb

## 2017-12-26 DIAGNOSIS — Z8619 Personal history of other infectious and parasitic diseases: Secondary | ICD-10-CM | POA: Diagnosis not present

## 2017-12-26 DIAGNOSIS — Z01419 Encounter for gynecological examination (general) (routine) without abnormal findings: Secondary | ICD-10-CM

## 2017-12-26 DIAGNOSIS — N644 Mastodynia: Secondary | ICD-10-CM | POA: Diagnosis not present

## 2017-12-26 DIAGNOSIS — Z0000 Encounter for general adult medical examination without abnormal findings: Secondary | ICD-10-CM | POA: Diagnosis not present

## 2017-12-26 MED ORDER — VALACYCLOVIR HCL 1 G PO TABS: 1000.0000 mg | ORAL_TABLET | Freq: Every day | ORAL | 6 refills | Status: DC

## 2017-12-26 NOTE — Progress Notes (Signed)
62 y.o. [id number] Widowed  Caucasian Fe here for annual exam. Menopausal no vaginal bleeding or dryness. Sees Dr. Ronnald Ramp for aex, labs. Continues with occasional HSV occurrence. Needs Valtrex update for prevention. Some breast tenderness, no nipple discharge or pain. Feels may be bra related. Uses strapless bra. No injury, feels better with no bra use. Mammogram normal recently. Enjoying retirement! No other health concerns today.  Patient's last menstrual period was 03/16/2010 (approximate).          Sexually active: Yes.    The current method of family planning is post menopausal status.    Exercising: No.  exercise Smoker:  no  Review of Systems  Constitutional:       Weight gain  HENT: Negative.   Eyes: Negative.   Respiratory: Negative.   Cardiovascular: Negative.   Gastrointestinal: Positive for constipation and heartburn.       Bloating  Genitourinary: Negative.   Musculoskeletal: Negative.   Skin:       Breast pain  Neurological: Negative.   Endo/Heme/Allergies: Negative.   Psychiatric/Behavioral: Negative.     Health Maintenance: Pap: 11-19-13 neg HPV HR neg, 11-16-15 neg History of Abnormal Pap: no MMG:  12-18-17 category c density birads 1:neg Self Breast exams: yes Colonoscopy:  2015 f/u 58yrs due next year BMD:   2015 TDaP:  2016 Shingles: 2019 Pneumonia: 2015  Hep C and HIV: HIV neg 2018 Labs: with PCP   reports that she has never smoked. She has never used smokeless tobacco. She reports that she does not drink alcohol or use drugs.  Past Medical History:  Diagnosis Date  . 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 Bipolar I disorder, most recent episode (or current) depressed   . Headache(784.0)   . Other and unspecified hyperlipidemia   . Other atopic dermatitis and related conditions   . 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 CESAREAN SECTION     x 2  . WISDOM TOOTH EXTRACTION   age 62    Current Outpatient Medications  Medication Sig Dispense Refill  . CALCIUM PO Take 1 tablet by mouth every 3 (three) days.     . Cholecalciferol (VITAMIN D) 2000 UNITS CAPS Take by mouth.    . ciprofloxacin-dexamethasone (CIPRODEX) OTIC suspension Place 4 drops into the right ear 2 (two) times daily. 7.5 mL 0  . ibuprofen (ADVIL,MOTRIN) 800 MG tablet TAKE 1 TABLET (800 MG TOTAL)   BY MOUTH EVERY 8 (EIGHT) HOURS AS NEEDED . 30 tablet 1  . lithium carbonate 300 MG capsule TAKE ONE CAPSULE BY MOUTH THREE TIMES DAILY WITH MEALS 90 capsule 6  . scopolamine (TRANSDERM-SCOP, 1.5 MG,) 1 MG/3DAYS Place 1 patch (1.5 mg total) onto the skin every 3 (three) days. 4 patch 0  . valACYclovir (VALTREX) 1000 MG tablet Take 1 tablet (1,000 mg total) by mouth daily. 60 tablet 5   No current facility-administered medications for this visit.     Family History  Problem Relation Age of Onset  . Heart disease Father   . Hypertension Father   . Thyroid disease Mother   . Osteoporosis Mother   . Colon cancer Maternal Grandmother 8  . Breast cancer Neg Hx     ROS:  Pertinent items are noted in HPI.  Otherwise, a comprehensive ROS was negative.  Exam:   LMP 03/16/2010 (Approximate)    Ht Readings from Last 3 Encounters:  07/04/17 5\' 2"  (1.575 m)  02/25/17 5\' 2"  (1.575 m)  12/14/16 5' 2.25" (1.581 m)    General appearance: alert, cooperative and appears stated age Head: Normocephalic, without obvious abnormality, atraumatic Neck: no adenopathy, supple, symmetrical, trachea midline and thyroid normal to inspection and palpation Lungs: clear to auscultation bilaterally Breasts: normal appearance, no masses or tenderness, No nipple retraction or dimpling, No nipple discharge or bleeding, No axillary or supraclavicular adenopathy Heart: regular rate and rhythm Abdomen: soft, non-tender; no masses,  no organomegaly Extremities: extremities normal, atraumatic, no cyanosis or edema Skin: Skin  color, texture, turgor normal. No rashes or lesions Lymph nodes: Cervical, supraclavicular, and axillary nodes normal. No abnormal inguinal nodes palpated Neurologic: Grossly normal   Pelvic: External genitalia:  no lesions              Urethra:  normal appearing urethra with no masses, tenderness or lesions              Bartholin's and Skene's: normal                 Vagina: normal appearing vagina with normal color and discharge, no lesions              Cervix: anteverted, no cervical motion tenderness and no lesions              Pap taken: No. Bimanual Exam:  Uterus:  normal size, contour, position, consistency, mobility, non-tender              Adnexa: normal adnexa and no mass, fullness, tenderness               Rectovaginal: Confirms               Anus:  normal sphincter tone, no lesions  Chaperone present: yes  A:  Well Woman with normal exam  Menopausal no HRT  Breast tenderness from bra  BMD due  P:   Reviewed health and wellness pertinent to exam  Aware of need to advise if vaginal bleeding  Discussed stop using tight underwear strapless bra and see if this resolved in outer area of breast. If no change needs to advise for reassessment. Patient agreeable.  Discussed BMD due and will schedule in [employer].  Continue follow up with PCP as indicated.  Pap smear: no   counseled on breast self exam, mammography screening, feminine hygiene, adequate intake of calcium and vitamin D, diet and exercise  return annually or prn  An After Visit Summary was printed and given to the patient.

## 2017-12-26 NOTE — Patient Instructions (Signed)
EXERCISE AND DIET:  We recommended that you start or continue a regular exercise program for good health. Regular exercise means any activity that makes your heart beat faster and makes you sweat.  We recommend exercising at least 30 minutes per day at least 3 days a week, preferably 4 or 5.  We also recommend a diet low in fat and sugar.  Inactivity, poor dietary choices and obesity can cause diabetes, heart attack, stroke, and kidney damage, among others.    ALCOHOL AND SMOKING:  Women should limit their alcohol intake to no more than 7 drinks/beers/glasses of wine (combined, not each!) per week. Moderation of alcohol intake to this level decreases your risk of breast cancer and liver damage. And of course, no recreational drugs are part of a healthy lifestyle.  And absolutely no smoking or even second hand smoke. Most people know smoking can cause heart and lung diseases, but did you know it also contributes to weakening of your bones? Aging of your skin?  Yellowing of your teeth and nails?  CALCIUM AND VITAMIN D:  Adequate intake of calcium and Vitamin D are recommended.  The recommendations for exact amounts of these supplements seem to change often, but generally speaking 600 mg of calcium (either carbonate or citrate) and 800 units of Vitamin D per day seems prudent. Certain women may benefit from higher intake of Vitamin D.  If you are among these women, your doctor will have told you during your visit.    PAP SMEARS:  Pap smears, to check for cervical cancer or precancers,  have traditionally been done yearly, although recent scientific advances have shown that most women can have pap smears less often.  However, every woman still should have a physical exam from her gynecologist every year. It will include a breast check, inspection of the vulva and vagina to check for abnormal growths or skin changes, a visual exam of the cervix, and then an exam to evaluate the size and shape of the uterus and  ovaries.  And after 60 years of age, a rectal exam is indicated to check for rectal cancers. We will also provide age appropriate advice regarding health maintenance, like when you should have certain vaccines, screening for sexually transmitted diseases, bone density testing, colonoscopy, mammograms, etc.   MAMMOGRAMS:  All women over 40 years old should have a yearly mammogram. Many facilities now offer a "3D" mammogram, which may cost around $50 extra out of pocket. If possible,  we recommend you accept the option to have the 3D mammogram performed.  It both reduces the number of women who will be called back for extra views which then turn out to be normal, and it is better than the routine mammogram at detecting truly abnormal areas.    COLONOSCOPY:  Colonoscopy to screen for colon cancer is recommended for all women at age 50.  We know, you hate the idea of the prep.  We agree, BUT, having colon cancer and not knowing it is worse!!  Colon cancer so often starts as a polyp that can be seen and removed at colonscopy, which can quite literally save your life!  And if your first colonoscopy is normal and you have no family history of colon cancer, most women don't have to have it again for 10 years.  Once every ten years, you can do something that may end up saving your life, right?  We will be happy to help you get it scheduled when you are ready.    Be sure to check your insurance coverage so you understand how much it will cost.  It may be covered as a preventative service at no cost, but you should check your particular policy.      Breast Tenderness Breast tenderness is a common problem for women of all ages. Breast tenderness may cause mild discomfort to severe pain. The pain usually comes and goes in association with your menstrual cycle, but it can be constant. Breast tenderness has many possible causes, including hormone changes and some medicines. Your health care provider may order tests, such as  a mammogram or an ultrasound, to check for any unusual findings. Having breast tenderness usually does not mean that you have breast cancer. Follow these instructions at home: Sometimes, reassurance that you do not have breast cancer is all that is needed. In general, follow these home care instructions: Managing pain and discomfort  If directed, apply ice to the area: ? Put ice in a plastic bag. ? Place a towel between your skin and the bag. ? Leave the ice on for 20 minutes, 2-3 times a day.  Make sure you are wearing a supportive bra, especially during exercise. You may also want to wear a supportive bra while sleeping if your breasts are very tender. Medicines  Take over-the-counter and prescription medicines only as told by your health care provider. If the cause of your pain is infection, you may be prescribed an antibiotic medicine.  If you were prescribed an antibiotic, take it as told by your health care provider. Do not stop taking the antibiotic even if you start to feel better. General instructions  Your health care provider may recommend that you reduce the amount of fat in your diet. You can do this by: ? Limiting fried foods. ? Cooking foods using methods, such as baking, boiling, grilling, and broiling.  Decrease the amount of caffeine in your diet. You can do this by drinking more water and choosing caffeine-free options.  Keep a log of the days and times when your breasts are most tender.  Ask your health care provider how to do breast exams at home. This will help you notice if you have an unusual growth or lump. Contact a health care provider if:  Any part of your breast is hard, red, and hot to the touch. This may be a sign of infection.  You are not breastfeeding and you have fluid, especially blood or pus, coming out of your nipples.  You have a fever.  You have a new or painful lump in your breast that remains after your menstrual period ends.  Your pain  does not improve or it gets worse.  Your pain is interfering with your daily activities. This information is not intended to replace advice given to you by your health care provider. Make sure you discuss any questions you have with your health care provider. Document Released: 12/15/2007 Document Revised: 09/30/2015 Document Reviewed: 09/30/2015 Elsevier Interactive Patient Education  2018 Elsevier Inc.  

## 2018-01-03 ENCOUNTER — Encounter: Payer: Self-pay | Admitting: Physician Assistant

## 2018-01-03 ENCOUNTER — Ambulatory Visit (INDEPENDENT_AMBULATORY_CARE_PROVIDER_SITE_OTHER): Payer: BC Managed Care – PPO | Admitting: Physician Assistant

## 2018-01-03 VITALS — BP 105/63 | HR 67 | Temp 97.0°F | Ht 62.75 in | Wt 145.2 lb

## 2018-01-03 DIAGNOSIS — H61891 Other specified disorders of right external ear: Secondary | ICD-10-CM | POA: Diagnosis not present

## 2018-01-03 DIAGNOSIS — F3130 Bipolar disorder, current episode depressed, mild or moderate severity, unspecified: Secondary | ICD-10-CM | POA: Diagnosis not present

## 2018-01-03 DIAGNOSIS — E559 Vitamin D deficiency, unspecified: Secondary | ICD-10-CM

## 2018-01-03 DIAGNOSIS — K2100 Gastro-esophageal reflux disease with esophagitis, without bleeding: Secondary | ICD-10-CM

## 2018-01-03 DIAGNOSIS — Z0000 Encounter for general adult medical examination without abnormal findings: Secondary | ICD-10-CM

## 2018-01-03 MED ORDER — LITHIUM CARBONATE 300 MG PO CAPS: ORAL_CAPSULE | ORAL | 6 refills | Status: DC

## 2018-01-03 MED ORDER — OMEPRAZOLE 20 MG PO CPDR: 20.0000 mg | DELAYED_RELEASE_CAPSULE | Freq: Every day | ORAL | 3 refills | Status: DC

## 2018-01-04 LAB — CBC WITH DIFFERENTIAL/PLATELET
BASOS ABS: 0 10*3/uL (ref 0.0–0.2)
Basos: 1 %
EOS (ABSOLUTE): 0.2 10*3/uL (ref 0.0–0.4)
EOS: 3 %
HEMATOCRIT: 37 % (ref 34.0–46.6)
Hemoglobin: 12 g/dL (ref 11.1–15.9)
IMMATURE GRANULOCYTES: 0 %
Immature Grans (Abs): 0 10*3/uL (ref 0.0–0.1)
LYMPHS ABS: 1.4 10*3/uL (ref 0.7–3.1)
Lymphs: 24 %
MCH: 26.9 pg (ref 26.6–33.0)
MCHC: 32.4 g/dL (ref 31.5–35.7)
MCV: 83 fL (ref 79–97)
MONOCYTES: 6 %
MONOS ABS: 0.3 10*3/uL (ref 0.1–0.9)
NEUTROS PCT: 66 %
Neutrophils Absolute: 3.9 10*3/uL (ref 1.4–7.0)
Platelets: 298 10*3/uL (ref 150–450)
RBC: 4.46 x10E6/uL (ref 3.77–5.28)
RDW: 13.5 % (ref 12.3–15.4)
WBC: 5.8 10*3/uL (ref 3.4–10.8)

## 2018-01-04 LAB — LIPID PANEL
CHOL/HDL RATIO: 3.9 ratio (ref 0.0–4.4)
Cholesterol, Total: 198 mg/dL (ref 100–199)
HDL: 51 mg/dL (ref >39)
LDL Calculated: 116 mg/dL — ABNORMAL HIGH (ref 0–99)
TRIGLYCERIDES: 155 mg/dL — AB (ref 0–149)
VLDL Cholesterol Cal: 31 mg/dL (ref 5–40)

## 2018-01-04 LAB — CMP14+EGFR
ALK PHOS: 66 IU/L (ref 39–117)
ALT: 15 IU/L (ref 0–32)
AST: 15 IU/L (ref 0–40)
Albumin/Globulin Ratio: 1.9 (ref 1.2–2.2)
Albumin: 4.3 g/dL (ref 3.6–4.8)
BILIRUBIN TOTAL: 0.4 mg/dL (ref 0.0–1.2)
BUN/Creatinine Ratio: 11 — ABNORMAL LOW (ref 12–28)
BUN: 9 mg/dL (ref 8–27)
CHLORIDE: 106 mmol/L (ref 96–106)
CO2: 21 mmol/L (ref 20–29)
CREATININE: 0.84 mg/dL (ref 0.57–1.00)
Calcium: 10.3 mg/dL (ref 8.7–10.3)
GFR calc Af Amer: 87 mL/min/{1.73_m2} (ref >59)
GFR calc non Af Amer: 76 mL/min/{1.73_m2} (ref >59)
GLUCOSE: 80 mg/dL (ref 65–99)
Globulin, Total: 2.3 g/dL (ref 1.5–4.5)
Potassium: 4.4 mmol/L (ref 3.5–5.2)
SODIUM: 140 mmol/L (ref 134–144)
Total Protein: 6.6 g/dL (ref 6.0–8.5)

## 2018-01-04 LAB — VITAMIN D 25 HYDROXY (VIT D DEFICIENCY, FRACTURES): Vit D, 25-Hydroxy: 41.7 ng/mL (ref 30.0–100.0)

## 2018-01-04 LAB — LITHIUM LEVEL: Lithium Lvl: 0.7 mmol/L (ref 0.6–1.2)

## 2018-01-05 DIAGNOSIS — K2100 Gastro-esophageal reflux disease with esophagitis, without bleeding: Secondary | ICD-10-CM | POA: Insufficient documentation

## 2018-01-05 NOTE — Progress Notes (Signed)
BP 105/63   Pulse 67   Temp (!) 97 F (36.1 C) (Oral)   Ht 5' 2.75" (1.594 m)   Wt 145 lb 3.2 oz (65.9 kg)   LMP 03/16/2010 (Approximate)   BMI 25.93 kg/m    Subjective:    Patient ID: [name], female    DOB: January 02, 1956, 62 y.o.   MRN: [id number]  HPI: [name] is a 62 y.o. female presenting on 01/03/2018 for Hyperlipidemia (6 month follow up )  This patient comes in for periodic recheck on medications and conditions including GERD, vitamin D deficiency, bipolar 1 disorder controlled, dry ear.  She has been doing very well overall.  She does need medications refilled and labs will be performed.  She has no other issues going on at this time..   All medications are reviewed today. There are no reports of any problems with the medications. All of the medical conditions are reviewed and updated.  Lab work is reviewed and will be ordered as medically necessary. There are no new problems reported with today's visit.   Past Medical History:  Diagnosis Date  . 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 Bipolar I disorder, most recent episode (or current) depressed   . Headache(784.0)   . Other and unspecified hyperlipidemia   . Other atopic dermatitis and related conditions   . STD (sexually transmitted disease) 1989   HSV   Relevant past medical, surgical, family and social history reviewed and updated as indicated. Interim medical history since our last visit reviewed. Allergies and medications reviewed and updated. DATA REVIEWED: CHART IN EPIC  Family History reviewed for pertinent findings.  Review of Systems  Constitutional: Negative.  Negative for activity change, fatigue and fever.  HENT: Negative.   Eyes: Negative.   Respiratory: Negative.  Negative for cough.   Cardiovascular: Negative.  Negative for chest pain.  Gastrointestinal: Negative.  Negative for abdominal pain.  Endocrine: Negative.   Genitourinary: Negative.  Negative for dysuria.    Musculoskeletal: Negative.   Skin: Negative.   Neurological: Negative.     Allergies as of 01/03/2018      Reactions   Penicillins Rash      Medication List       Accurate as of January 03, 2018 11:59 PM. Always use your most recent med list.        CALCIUM + D PO Take by mouth.   ibuprofen 800 MG tablet Commonly known as:  ADVIL,MOTRIN TAKE 1 TABLET (800 MG TOTAL)   BY MOUTH EVERY 8 (EIGHT) HOURS AS NEEDED .   lithium carbonate 300 MG capsule TAKE ONE CAPSULE BY MOUTH THREE TIMES DAILY WITH MEALS   omeprazole 20 MG capsule Commonly known as:  PRILOSEC Take 1 capsule (20 mg total) by mouth daily.   valACYclovir 1000 MG tablet Commonly known as:  VALTREX Take 1 tablet (1,000 mg total) by mouth daily.   VITAMIN D PO Take by mouth.          Objective:    BP 105/63   Pulse 67   Temp (!) 97 F (36.1 C) (Oral)   Ht 5' 2.75" (1.594 m)   Wt 145 lb 3.2 oz (65.9 kg)   LMP 03/16/2010 (Approximate)   BMI 25.93 kg/m   Allergies  Allergen Reactions  . Penicillins Rash    Wt Readings from Last 3 Encounters:  01/03/18 145 lb 3.2 oz (65.9 kg)  12/26/17 147 lb (66.7 kg)  07/04/17 142 lb 9.6 oz (64.7 kg)    Physical Exam Constitutional:      Appearance: She is well-developed.  HENT:     Head: Normocephalic and atraumatic.  Eyes:     Conjunctiva/sclera: Conjunctivae normal.     Pupils: Pupils are equal, round, and reactive to light.  Cardiovascular:     Rate and Rhythm: Normal rate and regular rhythm.     Heart sounds: Normal heart sounds.  Pulmonary:     Effort: Pulmonary effort is normal.     Breath sounds: Normal breath sounds.  Abdominal:     General: Bowel sounds are normal.     Palpations: Abdomen is soft.  Skin:    General: Skin is warm and dry.     Findings: No rash.  Neurological:     Mental Status: She is alert and oriented to person, place, and time.     Deep Tendon Reflexes: Reflexes are normal and symmetric.  Psychiatric:         Behavior: Behavior normal.        Thought Content: Thought content normal.        Judgment: Judgment normal.     Results for orders placed or performed in visit on 01/03/18  CMP14+EGFR  Result Value Ref Range   Glucose 80 65 - 99 mg/dL   BUN 9 8 - 27 mg/dL   Creatinine, Ser 0.84 0.57 - 1.00 mg/dL   GFR calc non Af Amer 76 >59 mL/min/1.73   GFR calc Af Amer 87 >59 mL/min/1.73   BUN/Creatinine Ratio 11 (L) 12 - 28   Sodium 140 134 - 144 mmol/L   Potassium 4.4 3.5 - 5.2 mmol/L   Chloride 106 96 - 106 mmol/L   CO2 21 20 - 29 mmol/L   Calcium 10.3 8.7 - 10.3 mg/dL   Total Protein 6.6 6.0 - 8.5 g/dL   Albumin 4.3 3.6 - 4.8 g/dL   Globulin, Total 2.3 1.5 - 4.5 g/dL   Albumin/Globulin Ratio 1.9 1.2 - 2.2   Bilirubin Total 0.4 0.0 - 1.2 mg/dL   Alkaline Phosphatase 66 39 - 117 IU/L   AST 15 0 - 40 IU/L   ALT 15 0 - 32 IU/L  CBC with Differential/Platelet  Result Value Ref Range   WBC 5.8 3.4 - 10.8 x10E3/uL   RBC 4.46 3.77 - 5.28 x10E6/uL   Hemoglobin 12.0 11.1 - 15.9 g/dL   Hematocrit 37.0 34.0 - 46.6 %   MCV 83 79 - 97 fL   MCH 26.9 26.6 - 33.0 pg   MCHC 32.4 31.5 - 35.7 g/dL   RDW 13.5 12.3 - 15.4 %   Platelets 298 150 - 450 x10E3/uL   Neutrophils 66 Not Estab. %   Lymphs 24 Not Estab. %   Monocytes 6 Not Estab. %   Eos 3 Not Estab. %   Basos 1 Not Estab. %   Neutrophils Absolute 3.9 1.4 - 7.0 x10E3/uL   Lymphocytes Absolute 1.4 0.7 - 3.1 x10E3/uL   Monocytes Absolute 0.3 0.1 - 0.9 x10E3/uL   EOS (ABSOLUTE) 0.2 0.0 - 0.4 x10E3/uL   Basophils Absolute 0.0 0.0 - 0.2 x10E3/uL   Immature Granulocytes 0 Not Estab. %   Immature Grans (Abs) 0.0 0.0 - 0.1 x10E3/uL  Lipid panel  Result Value Ref Range   Cholesterol, Total 198 100 - 199 mg/dL   Triglycerides 155 (H) 0 - 149 mg/dL   HDL 51 >39 mg/dL   VLDL Cholesterol Cal 31 5 -  40 mg/dL   LDL Calculated 116 (H) 0 - 99 mg/dL   Chol/HDL Ratio 3.9 0.0 - 4.4 ratio  Lithium level  Result Value Ref Range   Lithium Lvl 0.7 0.6 -  1.2 mmol/L  VITAMIN D 25 Hydroxy (Vit-D Deficiency, Fractures)  Result Value Ref Range   Vit D, 25-Hydroxy 41.7 30.0 - 100.0 ng/mL      Assessment & Plan:   1. Gastroesophageal reflux disease with esophagitis - omeprazole (PRILOSEC) 20 MG capsule; Take 1 capsule (20 mg total) by mouth daily.  Dispense: 30 capsule; Refill: 3  2. Vitamin D deficiency - VITAMIN D 25 Hydroxy (Vit-D Deficiency, Fractures)  3. Bipolar I disorder, most recent episode (or current) depressed ([place]) - Lithium level  4. Dry right ear canal Sweet oil  5. Well adult exam - CMP14+EGFR - CBC with Differential/Platelet - Lipid panel - Lithium level   Continue all other maintenance medications as listed above.  Follow up plan: Return in about 6 months (around 07/05/2018).  Educational handout given for [place] PA-C [place] [address]  [place], [place] [address] [phone]   01/05/2018, 9:16 PM

## 2018-07-07 ENCOUNTER — Other Ambulatory Visit: Payer: Self-pay

## 2018-07-07 ENCOUNTER — Encounter: Payer: Self-pay | Admitting: Physician Assistant

## 2018-07-07 ENCOUNTER — Ambulatory Visit: Payer: BC Managed Care – PPO | Admitting: Physician Assistant

## 2018-07-07 VITALS — BP 124/70 | HR 62 | Temp 97.5°F | Ht 62.75 in | Wt 146.6 lb

## 2018-07-07 DIAGNOSIS — K2100 Gastro-esophageal reflux disease with esophagitis, without bleeding: Secondary | ICD-10-CM

## 2018-07-07 DIAGNOSIS — F3130 Bipolar disorder, current episode depressed, mild or moderate severity, unspecified: Secondary | ICD-10-CM | POA: Diagnosis not present

## 2018-07-07 DIAGNOSIS — E785 Hyperlipidemia, unspecified: Secondary | ICD-10-CM

## 2018-07-07 DIAGNOSIS — E559 Vitamin D deficiency, unspecified: Secondary | ICD-10-CM | POA: Diagnosis not present

## 2018-07-07 DIAGNOSIS — Z0000 Encounter for general adult medical examination without abnormal findings: Secondary | ICD-10-CM

## 2018-07-07 MED ORDER — LITHIUM CARBONATE 300 MG PO CAPS: ORAL_CAPSULE | ORAL | 6 refills | Status: DC

## 2018-07-07 MED ORDER — OMEPRAZOLE 20 MG PO CPDR: 20.0000 mg | DELAYED_RELEASE_CAPSULE | Freq: Every day | ORAL | 3 refills | Status: DC

## 2018-07-07 NOTE — Progress Notes (Signed)
BP 124/70   Pulse 62   Temp (!) 97.5 F (36.4 C) (Oral)   Ht 5' 2.75" (1.594 m)   Wt 146 lb 9.6 oz (66.5 kg)   LMP 03/16/2010 (Approximate)   BMI 26.18 kg/m    Subjective:    Patient ID: [name], female    DOB: 1955-09-14, 63 y.o.   MRN: [id number]  HPI: [name] is a 63 y.o. female presenting on 07/07/2018 for Medical Management of Chronic Issues (6 month follow up )  Patient comes in for 80-monthrecheck on her chronic medical conditions that do include GERD, bipolar mood disorder with depression, hyperlipidemia, vitamin D deficiency.  She does take lithium and we will draw lab level today of that.  She states overall she has been very good and quite stable and not having any difficulties with her medications.  They are also working very well.  We will have her refill sent in if needed, she will call if there are any other problems.  Past Medical History:  Diagnosis Date  . 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 Bipolar I disorder, most recent episode (or current) depressed   . Headache(784.0)   . Other and unspecified hyperlipidemia   . Other atopic dermatitis and related conditions   . STD (sexually transmitted disease) 1989   HSV   Relevant past medical, surgical, family and social history reviewed and updated as indicated. Interim medical history since our last visit reviewed. Allergies and medications reviewed and updated. DATA REVIEWED: CHART IN EPIC  Family History reviewed for pertinent findings.  Review of Systems  Constitutional: Positive for fatigue.  HENT: Negative.   Eyes: Negative.   Respiratory: Negative.   Gastrointestinal: Negative.   Genitourinary: Negative.   Psychiatric/Behavioral: Positive for dysphoric mood. The patient is nervous/anxious.     Allergies as of 07/07/2018      Reactions   Penicillins Rash      Medication List       Accurate as of July 07, 2018 11:59 PM. If you have any questions, ask your nurse or  doctor.        STOP taking these medications   ibuprofen 800 MG tablet Commonly known as: ADVIL Stopped by: ATerald Sleeper PA-C     TAKE these medications   CALCIUM + D PO Take by mouth.   lithium carbonate 300 MG capsule TAKE ONE CAPSULE BY MOUTH THREE TIMES DAILY WITH MEALS   omeprazole 20 MG capsule Commonly known as: PRILOSEC Take 1 capsule (20 mg total) by mouth daily.   valACYclovir 1000 MG tablet Commonly known as: VALTREX Take 1 tablet (1,000 mg total) by mouth daily.   VITAMIN D PO Take by mouth.          Objective:    BP 124/70   Pulse 62   Temp (!) 97.5 F (36.4 C) (Oral)   Ht 5' 2.75" (1.594 m)   Wt 146 lb 9.6 oz (66.5 kg)   LMP 03/16/2010 (Approximate)   BMI 26.18 kg/m   Allergies  Allergen Reactions  . Penicillins Rash    Wt Readings from Last 3 Encounters:  07/07/18 146 lb 9.6 oz (66.5 kg)  01/03/18 145 lb 3.2 oz (65.9 kg)  12/26/17 147 lb (66.7 kg)    Physical Exam Constitutional:      Appearance: She is well-developed.  HENT:     Head: Normocephalic and atraumatic.  Eyes:     Conjunctiva/sclera: Conjunctivae  normal.     Pupils: Pupils are equal, round, and reactive to light.  Cardiovascular:     Rate and Rhythm: Normal rate and regular rhythm.     Heart sounds: Normal heart sounds.  Pulmonary:     Effort: Pulmonary effort is normal.  Skin:    General: Skin is warm and dry.     Findings: No rash.  Neurological:     Mental Status: She is alert and oriented to person, place, and time.     Deep Tendon Reflexes: Reflexes are normal and symmetric.  Psychiatric:        Behavior: Behavior normal.        Thought Content: Thought content normal.        Judgment: Judgment normal.     Results for orders placed or performed in visit on 07/07/18  CBC with Differential/Platelet  Result Value Ref Range   WBC 4.8 3.4 - 10.8 x10E3/uL   RBC 4.58 3.77 - 5.28 x10E6/uL   Hemoglobin 10.1 (L) 11.1 - 15.9 g/dL   Hematocrit 32.4 (L) 34.0 -  46.6 %   MCV 71 (L) 79 - 97 fL   MCH 22.1 (L) 26.6 - 33.0 pg   MCHC 31.2 (L) 31.5 - 35.7 g/dL   RDW 15.3 11.7 - 15.4 %   Platelets 279 150 - 450 x10E3/uL   Neutrophils 65 Not Estab. %   Lymphs 26 Not Estab. %   Monocytes 6 Not Estab. %   Eos 2 Not Estab. %   Basos 1 Not Estab. %   Neutrophils Absolute 3.1 1.4 - 7.0 x10E3/uL   Lymphocytes Absolute 1.2 0.7 - 3.1 x10E3/uL   Monocytes Absolute 0.3 0.1 - 0.9 x10E3/uL   EOS (ABSOLUTE) 0.1 0.0 - 0.4 x10E3/uL   Basophils Absolute 0.0 0.0 - 0.2 x10E3/uL   Immature Granulocytes 0 Not Estab. %   Immature Grans (Abs) 0.0 0.0 - 0.1 x10E3/uL  CMP14+EGFR  Result Value Ref Range   Glucose 87 65 - 99 mg/dL   BUN 10 8 - 27 mg/dL   Creatinine, Ser 0.81 0.57 - 1.00 mg/dL   GFR calc non Af Amer 79 >59 mL/min/1.73   GFR calc Af Amer 91 >59 mL/min/1.73   BUN/Creatinine Ratio 12 12 - 28   Sodium 142 134 - 144 mmol/L   Potassium 4.5 3.5 - 5.2 mmol/L   Chloride 108 (H) 96 - 106 mmol/L   CO2 21 20 - 29 mmol/L   Calcium 10.0 8.7 - 10.3 mg/dL   Total Protein 6.6 6.0 - 8.5 g/dL   Albumin 4.3 3.8 - 4.8 g/dL   Globulin, Total 2.3 1.5 - 4.5 g/dL   Albumin/Globulin Ratio 1.9 1.2 - 2.2   Bilirubin Total 0.3 0.0 - 1.2 mg/dL   Alkaline Phosphatase 67 39 - 117 IU/L   AST 17 0 - 40 IU/L   ALT 16 0 - 32 IU/L  TSH  Result Value Ref Range   TSH 1.310 0.450 - 4.500 uIU/mL  Lipid panel  Result Value Ref Range   Cholesterol, Total 192 100 - 199 mg/dL   Triglycerides 136 0 - 149 mg/dL   HDL 54 >39 mg/dL   VLDL Cholesterol Cal 27 5 - 40 mg/dL   LDL Calculated 111 (H) 0 - 99 mg/dL   Chol/HDL Ratio 3.6 0.0 - 4.4 ratio  Lithium level  Result Value Ref Range   Lithium Lvl 0.8 0.6 - 1.2 mmol/L      Assessment & Plan:   1.  Gastroesophageal reflux disease with esophagitis - CBC with Differential/Platelet - CMP14+EGFR - omeprazole (PRILOSEC) 20 MG capsule; Take 1 capsule (20 mg total) by mouth daily.  Dispense: 30 capsule; Refill: 3  2. Hyperlipidemia,  unspecified hyperlipidemia type - CBC with Differential/Platelet - Lipid panel  3. Bipolar I disorder, most recent episode (or current) depressed ([place]) - Lithium level  4. Vitamin D deficiency Continue medication  5. Well adult exam - CBC with Differential/Platelet - CMP14+EGFR - TSH - Lipid panel - Lithium level   Continue all other maintenance medications as listed above.  Follow up plan: Return in about 6 months (around 01/06/2019).  Educational handout given for surveu  Terald Sleeper PA-C [place] [address]  [place], [place] [address] [phone]   07/08/2018, 5:10 PM

## 2018-07-08 LAB — CBC WITH DIFFERENTIAL/PLATELET
Basophils Absolute: 0 10*3/uL (ref 0.0–0.2)
Basos: 1 %
EOS (ABSOLUTE): 0.1 10*3/uL (ref 0.0–0.4)
Eos: 2 %
Hematocrit: 32.4 % — ABNORMAL LOW (ref 34.0–46.6)
Hemoglobin: 10.1 g/dL — ABNORMAL LOW (ref 11.1–15.9)
Immature Grans (Abs): 0 10*3/uL (ref 0.0–0.1)
Immature Granulocytes: 0 %
Lymphocytes Absolute: 1.2 10*3/uL (ref 0.7–3.1)
Lymphs: 26 %
MCH: 22.1 pg — ABNORMAL LOW (ref 26.6–33.0)
MCHC: 31.2 g/dL — ABNORMAL LOW (ref 31.5–35.7)
MCV: 71 fL — ABNORMAL LOW (ref 79–97)
Monocytes Absolute: 0.3 10*3/uL (ref 0.1–0.9)
Monocytes: 6 %
Neutrophils Absolute: 3.1 10*3/uL (ref 1.4–7.0)
Neutrophils: 65 %
Platelets: 279 10*3/uL (ref 150–450)
RBC: 4.58 x10E6/uL (ref 3.77–5.28)
RDW: 15.3 % (ref 11.7–15.4)
WBC: 4.8 10*3/uL (ref 3.4–10.8)

## 2018-07-08 LAB — LIPID PANEL
Chol/HDL Ratio: 3.6 ratio (ref 0.0–4.4)
Cholesterol, Total: 192 mg/dL (ref 100–199)
HDL: 54 mg/dL (ref >39)
LDL Calculated: 111 mg/dL — ABNORMAL HIGH (ref 0–99)
Triglycerides: 136 mg/dL (ref 0–149)
VLDL Cholesterol Cal: 27 mg/dL (ref 5–40)

## 2018-07-08 LAB — CMP14+EGFR
ALT: 16 IU/L (ref 0–32)
AST: 17 IU/L (ref 0–40)
Albumin/Globulin Ratio: 1.9 (ref 1.2–2.2)
Albumin: 4.3 g/dL (ref 3.8–4.8)
Alkaline Phosphatase: 67 IU/L (ref 39–117)
BUN/Creatinine Ratio: 12 (ref 12–28)
BUN: 10 mg/dL (ref 8–27)
Bilirubin Total: 0.3 mg/dL (ref 0.0–1.2)
CO2: 21 mmol/L (ref 20–29)
Calcium: 10 mg/dL (ref 8.7–10.3)
Chloride: 108 mmol/L — ABNORMAL HIGH (ref 96–106)
Creatinine, Ser: 0.81 mg/dL (ref 0.57–1.00)
GFR calc Af Amer: 91 mL/min/{1.73_m2} (ref >59)
GFR calc non Af Amer: 79 mL/min/{1.73_m2} (ref >59)
Globulin, Total: 2.3 g/dL (ref 1.5–4.5)
Glucose: 87 mg/dL (ref 65–99)
Potassium: 4.5 mmol/L (ref 3.5–5.2)
Sodium: 142 mmol/L (ref 134–144)
Total Protein: 6.6 g/dL (ref 6.0–8.5)

## 2018-07-08 LAB — LITHIUM LEVEL: Lithium Lvl: 0.8 mmol/L (ref 0.6–1.2)

## 2018-07-08 LAB — TSH: TSH: 1.31 u[IU]/mL (ref 0.450–4.500)

## 2018-07-09 ENCOUNTER — Other Ambulatory Visit: Payer: Self-pay | Admitting: *Deleted

## 2018-07-09 ENCOUNTER — Telehealth: Payer: Self-pay | Admitting: Physician Assistant

## 2018-07-09 DIAGNOSIS — Z1211 Encounter for screening for malignant neoplasm of colon: Secondary | ICD-10-CM

## 2018-07-09 NOTE — Telephone Encounter (Signed)
Aware.  Take iron 325mg  daily.  Should take stool softner along with iron.

## 2018-11-28 ENCOUNTER — Other Ambulatory Visit: Payer: Self-pay | Admitting: Certified Nurse Midwife

## 2018-11-28 DIAGNOSIS — Z1231 Encounter for screening mammogram for malignant neoplasm of breast: Secondary | ICD-10-CM

## 2018-12-08 ENCOUNTER — Other Ambulatory Visit: Payer: Self-pay | Admitting: Physician Assistant

## 2018-12-08 DIAGNOSIS — K2100 Gastro-esophageal reflux disease with esophagitis, without bleeding: Secondary | ICD-10-CM

## 2018-12-25 ENCOUNTER — Other Ambulatory Visit: Payer: Self-pay

## 2018-12-27 ENCOUNTER — Ambulatory Visit
Admission: RE | Admit: 2018-12-27 | Discharge: 2018-12-27 | Disposition: A | Discharge status: Home / Self Care | Payer: BC Managed Care – PPO | Source: Ambulatory Visit | Attending: Certified Nurse Midwife | Admitting: Certified Nurse Midwife

## 2018-12-27 ENCOUNTER — Other Ambulatory Visit: Payer: Self-pay

## 2018-12-27 DIAGNOSIS — Z1231 Encounter for screening mammogram for malignant neoplasm of breast: Secondary | ICD-10-CM

## 2018-12-29 ENCOUNTER — Ambulatory Visit (INDEPENDENT_AMBULATORY_CARE_PROVIDER_SITE_OTHER): Payer: BC Managed Care – PPO | Admitting: Certified Nurse Midwife

## 2018-12-29 ENCOUNTER — Other Ambulatory Visit: Payer: Self-pay

## 2018-12-29 ENCOUNTER — Other Ambulatory Visit (HOSPITAL_COMMUNITY)
Admission: RE | Admit: 2018-12-29 | Discharge: 2018-12-29 | Disposition: A | Discharge status: Home / Self Care | Payer: BC Managed Care – PPO | Source: Ambulatory Visit | Attending: Certified Nurse Midwife | Admitting: Certified Nurse Midwife

## 2018-12-29 ENCOUNTER — Encounter: Payer: Self-pay | Admitting: Certified Nurse Midwife

## 2018-12-29 VITALS — BP 104/60 | HR 68 | Temp 97.1°F | Resp 16 | Ht 62.25 in | Wt 147.0 lb

## 2018-12-29 DIAGNOSIS — N393 Stress incontinence (female) (male): Secondary | ICD-10-CM | POA: Diagnosis not present

## 2018-12-29 DIAGNOSIS — Z872 Personal history of diseases of the skin and subcutaneous tissue: Secondary | ICD-10-CM

## 2018-12-29 DIAGNOSIS — Z8619 Personal history of other infectious and parasitic diseases: Secondary | ICD-10-CM | POA: Diagnosis not present

## 2018-12-29 DIAGNOSIS — Z01419 Encounter for gynecological examination (general) (routine) without abnormal findings: Secondary | ICD-10-CM | POA: Diagnosis not present

## 2018-12-29 DIAGNOSIS — Z124 Encounter for screening for malignant neoplasm of cervix: Secondary | ICD-10-CM

## 2018-12-29 LAB — POCT URINALYSIS DIPSTICK
Bilirubin, UA: NEGATIVE
Blood, UA: NEGATIVE
Glucose, UA: NEGATIVE
Ketones, UA: NEGATIVE
Nitrite, UA: NEGATIVE
Protein, UA: NEGATIVE
Urobilinogen, UA: NEGATIVE E.U./dL — AB
pH, UA: 5 (ref 5.0–8.0)

## 2018-12-29 MED ORDER — VALACYCLOVIR HCL 1 G PO TABS: 1000.0000 mg | ORAL_TABLET | Freq: Every day | ORAL | 6 refills | Status: DC

## 2018-12-29 NOTE — Progress Notes (Signed)
Mammogram reviewed negative. Density C Repeat yearly

## 2018-12-29 NOTE — Progress Notes (Signed)
63 y.o. [id number] Widowed  Caucasian Fe here for annual exam. Post menopausal denies vaginal bleeding or vaginal dryness. She has ended her personal relationship. Has noted some stress incontinence if needs to urinate. Has been drinking tea and coffee and soda more than before. Has tried to  increase water intake which seems to be helping with need to urinate. Denies pain with urination or odor in urine. Sees PCP for aex,labs and medication management. All stable per patient. Has not noted a recent HSV outbreak but maybe once or twice. Request update on Valtrex if needed. Has also noted ? Eczema or dermatitis on left side near hip area, comes and goes. Has history of dermatitis. No other health concerns today.  Patient's last menstrual period was 03/16/2010 (approximate).          Sexually active: No.  The current method of family planning is post menopausal status.    Exercising: Yes.    walking & some dancing Smoker:  no  Review of Systems  Constitutional: Negative.   HENT: Negative.   Eyes: Negative.   Respiratory: Negative.   Cardiovascular: Negative.   Gastrointestinal: Negative.   Genitourinary: Positive for frequency and urgency.  Musculoskeletal: Negative.   Skin: Negative.   Neurological: Negative.   Endo/Heme/Allergies: Negative.   Psychiatric/Behavioral: Negative.     Health Maintenance: Pap:  11-19-13 neg HPV HR neg, 11-16-15 neg History of Abnormal Pap: no MMG:  12-27-2018 not result yet Self Breast exams: yes Colonoscopy:  2020 per patient( in [place]) polyps previously 7 years BMD:   2015 PCP manages  TDaP:  2016 Shingles: 2019 Pneumonia: 2015 Hep C and HIV: HIV neg 2018 Labs: poct urine-neg   reports that she has never smoked. She has never used smokeless tobacco. She reports that she does not drink alcohol or use drugs.  Past Medical History:  Diagnosis Date  . 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 Bipolar I disorder, most recent episode (or current)  depressed   . Headache(784.0)   . Other and unspecified hyperlipidemia   . Other atopic dermatitis and related conditions   . 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 CESAREAN SECTION     x 2  . WISDOM TOOTH EXTRACTION  age 17    Current Outpatient Medications  Medication Sig Dispense Refill  . Calcium Citrate-Vitamin D (CALCIUM + D PO) Take by mouth.    . Ferrous Sulfate (IRON PO) Take by mouth.    . lithium carbonate 300 MG capsule TAKE ONE CAPSULE BY MOUTH THREE TIMES DAILY WITH MEALS 90 capsule 6  . omeprazole (PRILOSEC) 20 MG capsule Take 1 capsule (20 mg total) by mouth daily. 30 capsule 0  . valACYclovir (VALTREX) 1000 MG tablet Take 1 tablet (1,000 mg total) by mouth daily. 60 tablet 6  . VITAMIN D PO Take by mouth.     No current facility-administered medications for this visit.    Family History  Problem Relation Age of Onset  . Heart disease Father   . Hypertension Father   . Thyroid disease Mother   . Osteoporosis Mother   . Colon cancer Maternal Grandmother 18  . Breast cancer Neg Hx     ROS:  Pertinent items are noted in HPI.  Otherwise, a comprehensive ROS was negative.  Exam:   BP 104/60   Pulse 68   Temp (!) 97.1 F (36.2 C) (Skin)  Resp 16   Ht 5' 2.25" (1.581 m)   Wt 147 lb (66.7 kg)   LMP 03/16/2010 (Approximate)   BMI 26.67 kg/m  Height: 5' 2.25" (158.1 cm) Ht Readings from Last 3 Encounters:  12/29/18 5' 2.25" (1.581 m)  07/07/18 5' 2.75" (1.594 m)  01/03/18 5' 2.75" (1.594 m)    General appearance: alert, cooperative and appears stated age Head: Normocephalic, without obvious abnormality, atraumatic Neck: no adenopathy, supple, symmetrical, trachea midline and thyroid normal to inspection and palpation Lungs: clear to auscultation bilaterally Breasts: normal appearance, no masses or tenderness, No nipple retraction or dimpling, No nipple discharge or bleeding, No axillary  or supraclavicular adenopathy Heart: regular rate and rhythm Abdomen: soft, non-tender; no masses,  no organomegaly Extremities: extremities normal, atraumatic, no cyanosis or edema, small slight scaling area noted near left hip, appears to be ?eczema or dermatitis. No redness or exudate or tenderness. Skin: Skin color, texture, turgor normal. No rashes or lesions Lymph nodes: Cervical, supraclavicular, and axillary nodes normal. No abnormal inguinal nodes palpated Neurologic: Grossly normal   Pelvic: External genitalia:  no lesions, normal female, slight atrophic appearane              Urethra:  normal appearing urethra with no masses, tenderness or lesions              Bartholin's and Skene's: normal                 Vagina: normal appearing vagina with normal color and discharge, no lesions              Cervix: no cervical motion tenderness, no lesions and normal appearance              Pap taken: Yes.   Bimanual Exam:  Uterus:  normal size, contour, position, consistency, mobility, non-tender and mid position              Adnexa: normal adnexa and no mass, fullness, tenderness               Rectovaginal: Confirms               Anus:  normal sphincter tone, no lesions  Chaperone present: yes  A:  Well Woman with normal exam  Post menopausal no HRT  History of HSV desires update of Valtrex 2  History of depression with MD management  Dermatitis vs eczema on skin  P:   Reviewed health and wellness pertinent to exam  Aware of need to advise if vaginal bleeding  Discussed risks/benefits of use. Rx Valtrex see order with instructions  Continue follow up with MD as indicated  Discussed using Aveeno Eczema cream per OTC instructions to see improves. If not needs to see dermatology.  Pap smear: yes  counseled on breast self exam, mammography screening, feminine hygiene, menopause, adequate intake of calcium and vitamin D, diet and exercise return annually or prn  An After Visit  Summary was printed and given to the patient.

## 2018-12-29 NOTE — Patient Instructions (Signed)

## 2018-12-30 ENCOUNTER — Ambulatory Visit: Payer: BC Managed Care – PPO | Admitting: Certified Nurse Midwife

## 2019-01-05 ENCOUNTER — Other Ambulatory Visit: Payer: Self-pay

## 2019-01-06 ENCOUNTER — Ambulatory Visit: Payer: BC Managed Care – PPO | Admitting: Physician Assistant

## 2019-01-06 ENCOUNTER — Encounter: Payer: Self-pay | Admitting: Physician Assistant

## 2019-01-06 VITALS — BP 115/79 | HR 100 | Temp 98.0°F | Ht 62.25 in | Wt 145.8 lb

## 2019-01-06 DIAGNOSIS — F3130 Bipolar disorder, current episode depressed, mild or moderate severity, unspecified: Secondary | ICD-10-CM | POA: Diagnosis not present

## 2019-01-06 DIAGNOSIS — K2100 Gastro-esophageal reflux disease with esophagitis, without bleeding: Secondary | ICD-10-CM

## 2019-01-06 DIAGNOSIS — Z0000 Encounter for general adult medical examination without abnormal findings: Secondary | ICD-10-CM

## 2019-01-06 DIAGNOSIS — Z8619 Personal history of other infectious and parasitic diseases: Secondary | ICD-10-CM | POA: Diagnosis not present

## 2019-01-06 DIAGNOSIS — E559 Vitamin D deficiency, unspecified: Secondary | ICD-10-CM | POA: Diagnosis not present

## 2019-01-06 LAB — CYTOLOGY - PAP
Comment: NEGATIVE
Comment: NEGATIVE
Comment: NEGATIVE
Diagnosis: NEGATIVE
HPV 16: NEGATIVE
HPV 18 / 45: NEGATIVE
High risk HPV: POSITIVE — AB

## 2019-01-06 MED ORDER — IBUPROFEN 800 MG PO TABS: 800.0000 mg | ORAL_TABLET | Freq: Three times a day (TID) | ORAL | 2 refills | Status: DC | PRN

## 2019-01-06 MED ORDER — LITHIUM CARBONATE 300 MG PO CAPS: ORAL_CAPSULE | ORAL | 3 refills | Status: DC

## 2019-01-06 MED ORDER — OMEPRAZOLE 20 MG PO CPDR: 20.0000 mg | DELAYED_RELEASE_CAPSULE | Freq: Every day | ORAL | 3 refills | Status: DC

## 2019-01-06 MED ORDER — VALACYCLOVIR HCL 1 G PO TABS: 1000.0000 mg | ORAL_TABLET | Freq: Every day | ORAL | 6 refills | Status: DC

## 2019-01-06 NOTE — Patient Instructions (Signed)

## 2019-01-07 LAB — CBC WITH DIFFERENTIAL/PLATELET
Basophils Absolute: 0 10*3/uL (ref 0.0–0.2)
Basos: 1 %
EOS (ABSOLUTE): 0.2 10*3/uL (ref 0.0–0.4)
Eos: 2 %
Hematocrit: 42.3 % (ref 34.0–46.6)
Hemoglobin: 14.2 g/dL (ref 11.1–15.9)
Immature Grans (Abs): 0 10*3/uL (ref 0.0–0.1)
Immature Granulocytes: 0 %
Lymphocytes Absolute: 1.5 10*3/uL (ref 0.7–3.1)
Lymphs: 23 %
MCH: 29.5 pg (ref 26.6–33.0)
MCHC: 33.6 g/dL (ref 31.5–35.7)
MCV: 88 fL (ref 79–97)
Monocytes Absolute: 0.3 10*3/uL (ref 0.1–0.9)
Monocytes: 5 %
Neutrophils Absolute: 4.5 10*3/uL (ref 1.4–7.0)
Neutrophils: 69 %
Platelets: 274 10*3/uL (ref 150–450)
RBC: 4.81 x10E6/uL (ref 3.77–5.28)
RDW: 13.7 % (ref 11.7–15.4)
WBC: 6.5 10*3/uL (ref 3.4–10.8)

## 2019-01-07 LAB — VITAMIN D 25 HYDROXY (VIT D DEFICIENCY, FRACTURES): Vit D, 25-Hydroxy: 58.5 ng/mL (ref 30.0–100.0)

## 2019-01-07 LAB — LIPID PANEL
Chol/HDL Ratio: 3.9 ratio (ref 0.0–4.4)
Cholesterol, Total: 198 mg/dL (ref 100–199)
HDL: 51 mg/dL (ref >39)
LDL Chol Calc (NIH): 121 mg/dL — ABNORMAL HIGH (ref 0–99)
Triglycerides: 148 mg/dL (ref 0–149)
VLDL Cholesterol Cal: 26 mg/dL (ref 5–40)

## 2019-01-07 LAB — CMP14+EGFR
ALT: 12 IU/L (ref 0–32)
AST: 12 IU/L (ref 0–40)
Albumin/Globulin Ratio: 1.6 (ref 1.2–2.2)
Albumin: 4.2 g/dL (ref 3.8–4.8)
Alkaline Phosphatase: 73 IU/L (ref 39–117)
BUN/Creatinine Ratio: 13 (ref 12–28)
BUN: 11 mg/dL (ref 8–27)
Bilirubin Total: 0.6 mg/dL (ref 0.0–1.2)
CO2: 23 mmol/L (ref 20–29)
Calcium: 10.3 mg/dL (ref 8.7–10.3)
Chloride: 108 mmol/L — ABNORMAL HIGH (ref 96–106)
Creatinine, Ser: 0.84 mg/dL (ref 0.57–1.00)
GFR calc Af Amer: 87 mL/min/{1.73_m2} (ref >59)
GFR calc non Af Amer: 75 mL/min/{1.73_m2} (ref >59)
Globulin, Total: 2.6 g/dL (ref 1.5–4.5)
Glucose: 86 mg/dL (ref 65–99)
Potassium: 4.5 mmol/L (ref 3.5–5.2)
Sodium: 142 mmol/L (ref 134–144)
Total Protein: 6.8 g/dL (ref 6.0–8.5)

## 2019-01-07 LAB — LITHIUM LEVEL: Lithium Lvl: 0.8 mmol/L (ref 0.6–1.2)

## 2019-01-12 NOTE — Progress Notes (Signed)
.   Acute Office Visit  Subjective:    Patient ID: [name], female    DOB: [birth year], 63 y.o.   MRN: [id number]  Chief Complaint  Patient presents with  . Medical Management of Chronic Issues    6 month   . Hyperlipidemia    Hyperlipidemia This is a chronic problem. The current episode started more than 1 year ago. The problem is controlled. Pertinent negatives include no chest pain. She is currently on no antihyperlipidemic treatment.  Gastroesophageal Reflux She complains of belching and heartburn. She reports no abdominal pain, no chest pain, no coughing or no sore throat. This is a chronic problem. The current episode started more than 1 year ago. The problem has been unchanged. The symptoms are aggravated by certain foods. Pertinent negatives include no fatigue. She has tried a PPI for the symptoms. The treatment provided significant relief.   The patient is also here for her chronic medical conditions that do include bipolar disorder, HSV.,  Multiple joint arthritis.  All the medications are reviewed and overall she is doing well and does need refills.  She states that everything is quite stable and she does not have any issues at this time.  Depression screen [facility] 2/9 01/06/2019 07/07/2018 01/03/2018 07/04/2017 02/25/2017  Decreased Interest 0 0 0 0 0  Down, Depressed, Hopeless 0 0 0 0 0  PHQ - 2 Score 0 0 0 0 0     Past Medical History:  Diagnosis Date  . 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 Bipolar I disorder, most recent episode (or current) depressed   . Headache(784.0)   . Other and unspecified hyperlipidemia   . Other atopic dermatitis and related conditions   . 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 CESAREAN SECTION     x 2  . WISDOM TOOTH EXTRACTION  age 56    Family History  Problem Relation Age of Onset  . Heart disease Father   . Hypertension Father   . Thyroid  disease Mother   . Osteoporosis Mother   . Colon cancer Maternal Grandmother 43  . Breast cancer Neg Hx     Social History   Socioeconomic History  . Marital status: Widowed    Spouse name: Not on file  . Number of children: 2  . Years of education: Not on file  . Highest education level: Not on file  Occupational History  . Occupation: Financial planner: Ambulance person  Tobacco Use  . Smoking status: Never Smoker  . Smokeless tobacco: Never Used  Substance and Sexual Activity  . Alcohol use: No  . Drug use: No  . Sexual activity: Not Currently    Partners: Male    Birth control/protection: Post-menopausal  Other Topics Concern  . Not on file  Social History Narrative  . Not on file   Social Determinants of Health   Financial Resource Strain:   . Difficulty of Paying Living Expenses: Not on file  Food Insecurity:   . Worried About Charity fundraiser in the Last Year: Not on file  . Ran Out of Food in the Last Year: Not on file  Transportation Needs:   . Lack of Transportation (Medical): Not on file  . Lack of Transportation (Non-Medical): Not on file  Physical Activity:   . Days of Exercise per Week: Not on file  .  Minutes of Exercise per Session: Not on file  Stress:   . Feeling of Stress : Not on file  Social Connections:   . Frequency of Communication with Friends and Family: Not on file  . Frequency of Social Gatherings with Friends and Family: Not on file  . Attends Religious Services: Not on file  . Active Member of Clubs or Organizations: Not on file  . Attends Archivist Meetings: Not on file  . Marital Status: Not on file  Intimate Partner Violence:   . Fear of Current or Ex-Partner: Not on file  . Emotionally Abused: Not on file  . Physically Abused: Not on file  . Sexually Abused: Not on file    Outpatient Medications Prior to Visit  Medication Sig Dispense Refill  . Calcium Citrate-Vitamin D (CALCIUM + D PO) Take  by mouth.    . Ferrous Sulfate (IRON PO) Take by mouth.    Marland Kitchen VITAMIN D PO Take by mouth.    . lithium carbonate 300 MG capsule TAKE ONE CAPSULE BY MOUTH THREE TIMES DAILY WITH MEALS 90 capsule 6  . omeprazole (PRILOSEC) 20 MG capsule Take 1 capsule (20 mg total) by mouth daily. 30 capsule 0  . valACYclovir (VALTREX) 1000 MG tablet Take 1 tablet (1,000 mg total) by mouth daily. 60 tablet 6   No facility-administered medications prior to visit.    Allergies  Allergen Reactions  . Penicillins Rash    Review of Systems  Constitutional: Negative.  Negative for activity change, fatigue and fever.  HENT: Negative.  Negative for sore throat.   Eyes: Negative.   Respiratory: Negative.  Negative for cough.   Cardiovascular: Negative.  Negative for chest pain.  Gastrointestinal: Positive for heartburn. Negative for abdominal pain.  Endocrine: Negative.   Genitourinary: Negative.  Negative for dysuria.  Musculoskeletal: Negative.   Skin: Negative.   Neurological: Negative.        Objective:    Physical Exam Constitutional:      General: She is not in acute distress.    Appearance: Normal appearance. She is well-developed.  HENT:     Head: Normocephalic and atraumatic.  Cardiovascular:     Rate and Rhythm: Normal rate.  Pulmonary:     Effort: Pulmonary effort is normal.  Skin:    General: Skin is warm and dry.     Findings: No rash.  Neurological:     Mental Status: She is alert and oriented to person, place, and time.     Deep Tendon Reflexes: Reflexes are normal and symmetric.     BP 115/79   Pulse 100   Temp 98 F (36.7 C) (Temporal)   Ht 5' 2.25" (1.581 m)   Wt 145 lb 12.8 oz (66.1 kg)   LMP 03/16/2010 (Approximate)   SpO2 100%   BMI 26.45 kg/m  Wt Readings from Last 3 Encounters:  01/06/19 145 lb 12.8 oz (66.1 kg)  12/29/18 147 lb (66.7 kg)  07/07/18 146 lb 9.6 oz (66.5 kg)    Health Maintenance Due  Topic Date Due  . Hepatitis C Screening  28-Apr-1955     There are no preventive care reminders to display for this patient.   Lab Results  Component Value Date   TSH 1.310 07/07/2018   Lab Results  Component Value Date   WBC 6.5 01/06/2019   HGB 14.2 01/06/2019   HCT 42.3 01/06/2019   MCV 88 01/06/2019   PLT 274 01/06/2019   Lab Results  Component  Value Date   NA 142 01/06/2019   K 4.5 01/06/2019   CO2 23 01/06/2019   GLUCOSE 86 01/06/2019   BUN 11 01/06/2019   CREATININE 0.84 01/06/2019   BILITOT 0.6 01/06/2019   ALKPHOS 73 01/06/2019   AST 12 01/06/2019   ALT 12 01/06/2019   PROT 6.8 01/06/2019   ALBUMIN 4.2 01/06/2019   CALCIUM 10.3 01/06/2019   Lab Results  Component Value Date   CHOL 198 01/06/2019   Lab Results  Component Value Date   HDL 51 01/06/2019   Lab Results  Component Value Date   LDLCALC 121 (H) 01/06/2019   Lab Results  Component Value Date   TRIG 148 01/06/2019   Lab Results  Component Value Date   CHOLHDL 3.9 01/06/2019   No results found for: HGBA1C     Assessment & Plan:   Problem List Items Addressed This Visit      Digestive   Gastroesophageal reflux disease with esophagitis   Relevant Medications   omeprazole (PRILOSEC) 20 MG capsule   Other Relevant Orders   CBC with Differential/Platelet (Completed)     Other   Bipolar I disorder, most recent episode (or current) depressed ([facility])   Relevant Medications   lithium carbonate 300 MG capsule   Other Relevant Orders   Lithium level (Completed)   Vitamin D deficiency   Relevant Orders   Vitamin D 25 hydroxy (Completed)    Other Visit Diagnoses    Well adult exam    -  Primary   Relevant Orders   CMP14+EGFR (Completed)   CBC with Differential/Platelet (Completed)   Lipid Panel (Completed)   Vitamin D 25 hydroxy (Completed)   Lithium level (Completed)   History of herpes genitalis       Relevant Medications   valACYclovir (VALTREX) 1000 MG tablet       Meds ordered this encounter  Medications  . ibuprofen  (IBU) 800 MG tablet    Sig: Take 1 tablet (800 mg total) by mouth every 8 (eight) hours as needed.    Dispense:  90 tablet    Refill:  2    Order Specific Question:   Supervising Provider    Answer:   Janora Norlander [[id number]]  . lithium carbonate 300 MG capsule    Sig: TAKE ONE CAPSULE BY MOUTH THREE TIMES DAILY WITH MEALS    Dispense:  270 capsule    Refill:  3    Order Specific Question:   Supervising Provider    Answer:   Janora Norlander [[id number]]  . omeprazole (PRILOSEC) 20 MG capsule    Sig: Take 1 capsule (20 mg total) by mouth daily.    Dispense:  90 capsule    Refill:  3    Order Specific Question:   Supervising Provider    Answer:   Janora Norlander [[id number]]  . valACYclovir (VALTREX) 1000 MG tablet    Sig: Take 1 tablet (1,000 mg total) by mouth daily.    Dispense:  60 tablet    Refill:  6    Patient will call when refill needed    Order Specific Question:   Supervising Provider    Answer:   Janora Norlander [[id number]]     Terald Sleeper, PA-C

## 2019-01-26 ENCOUNTER — Ambulatory Visit: Payer: BC Managed Care – PPO

## 2019-04-07 ENCOUNTER — Encounter: Payer: Self-pay | Admitting: Certified Nurse Midwife

## 2019-05-13 ENCOUNTER — Encounter: Payer: Self-pay | Admitting: *Deleted

## 2019-05-14 ENCOUNTER — Ambulatory Visit: Payer: BC Managed Care – PPO | Admitting: Physician Assistant

## 2019-05-14 ENCOUNTER — Encounter: Payer: Self-pay | Admitting: Physician Assistant

## 2019-05-14 ENCOUNTER — Other Ambulatory Visit: Payer: Self-pay

## 2019-05-14 DIAGNOSIS — L570 Actinic keratosis: Secondary | ICD-10-CM

## 2019-05-14 MED ORDER — FLUOROURACIL 5 % EX CREA: TOPICAL_CREAM | Freq: Every day | CUTANEOUS | 1 refills | Status: DC

## 2019-05-14 NOTE — Progress Notes (Signed)
   Follow up Visit  Subjective  [name] is a 63 y.o. female who presents for the following: Follow-up (Used 5FU nightly for 2 weeks.  Does not feel like she had a great reaction.). Around her nose to weakly react to the 5 FU. She feels like she still as a rough spot on her right cheek and on her nose. Halobetasol cream has cleared the rash on her legs. It only seems to come in the winter.    Objective  Well appearing patient in no apparent distress; mood and affect are within normal limits.  Face examined. Relevant physical exam findings are noted in the Assessment and Plan.   Objective  Left Forehead, Right Forehead (3): Erythematous patches with gritty scale.  Assessment & Plan  AK (actinic keratosis) (4) Left Forehead; Right Forehead (3)  Destruction of lesion - Left Forehead, Right Forehead Complexity: simple   Destruction method: cryotherapy   Informed consent: discussed and consent obtained   Timeout:  patient name, date of birth, surgical site, and procedure verified Lesion destroyed using liquid nitrogen: Yes   Outcome: patient tolerated procedure well with no complications    Ordered Medications: fluorouracil (EFUDEX) 5 % cream She will apply this again to her nose and cheeks and try to use it a full four weeks nightly.  I have reviewed the above documentation for accuracy and completeness and I agree with the above.  Chanti Golubski Clark-Bruning PA-C

## 2019-07-07 ENCOUNTER — Ambulatory Visit: Payer: BC Managed Care – PPO | Admitting: Family Medicine

## 2019-07-07 ENCOUNTER — Other Ambulatory Visit: Payer: Self-pay

## 2019-07-07 ENCOUNTER — Ambulatory Visit: Payer: BC Managed Care – PPO | Admitting: Physician Assistant

## 2019-07-07 ENCOUNTER — Encounter: Payer: Self-pay | Admitting: Family Medicine

## 2019-07-07 VITALS — BP 121/77 | HR 64 | Temp 97.8°F | Ht 62.25 in | Wt 138.0 lb

## 2019-07-07 DIAGNOSIS — F3130 Bipolar disorder, current episode depressed, mild or moderate severity, unspecified: Secondary | ICD-10-CM | POA: Diagnosis not present

## 2019-07-07 DIAGNOSIS — Z5181 Encounter for therapeutic drug level monitoring: Secondary | ICD-10-CM

## 2019-07-07 DIAGNOSIS — Z7689 Persons encountering health services in other specified circumstances: Secondary | ICD-10-CM | POA: Diagnosis not present

## 2019-07-07 DIAGNOSIS — K2100 Gastro-esophageal reflux disease with esophagitis, without bleeding: Secondary | ICD-10-CM

## 2019-07-07 DIAGNOSIS — Z79899 Other long term (current) drug therapy: Secondary | ICD-10-CM

## 2019-07-07 LAB — URINALYSIS
Bilirubin, UA: NEGATIVE
Glucose, UA: NEGATIVE
Ketones, UA: NEGATIVE
Nitrite, UA: NEGATIVE
Protein,UA: NEGATIVE
RBC, UA: NEGATIVE
Specific Gravity, UA: 1.02 (ref 1.005–1.030)
Urobilinogen, Ur: 0.2 mg/dL (ref 0.2–1.0)
pH, UA: 7 (ref 5.0–7.5)

## 2019-07-07 MED ORDER — LITHIUM CARBONATE 300 MG PO CAPS: ORAL_CAPSULE | ORAL | 1 refills | Status: DC

## 2019-07-07 MED ORDER — OMEPRAZOLE 20 MG PO CPDR: 20.0000 mg | DELAYED_RELEASE_CAPSULE | Freq: Every day | ORAL | 3 refills | Status: DC

## 2019-07-07 MED ORDER — IBUPROFEN 800 MG PO TABS: 800.0000 mg | ORAL_TABLET | Freq: Three times a day (TID) | ORAL | 2 refills | Status: DC | PRN

## 2019-07-07 NOTE — Patient Instructions (Signed)
Be cautious with Ibuprofen use and the Lithium. Ibuprofen can increase concentrations of the Lithium in your blood and lead to toxicity/ overdose.

## 2019-07-07 NOTE — Progress Notes (Signed)
Subjective: CC: est care, bipolar disorder, GERD PCP: Janora Norlander, DO FOY:DXAJOIN [name] is a 64 y.o. female presenting to clinic today for:  1.  Bipolar 1 disorder Patient notes that she was formally diagnosed in the 24s.  She was hospitalized x2 and then ultimately placed on lithium.  When she was discharged from her psychiatrist in [place], he advised that she stay on it.  She was reevaluated by Dr. Modesta Messing in [place] a few years ago at the request of her previous PCP who agreed with ongoing lithium treatment.  Her PCP agreed to continue monitoring and prescribing.  She reports being stable on this medication.  She has had no relapses since being on the medicine.  Denies any concerning symptoms or signs.  2.  GERD Patient reports compliance with omeprazole.  She reports stability of acid reflux symptoms.  No hematochezia, melena, nausea, vomiting, unplanned weight loss.  3.  Arthralgia/headache Patient reports intermittent use, sparing use of Advil.  She takes this less than 1 time per week and never 3 times per day.  She is never had an issue with this causing increased levels of her lithium.  Again, no history of GI bleed, increased acid reflux symptoms with the medication.   ROS: Per HPI  Allergies  Allergen Reactions  . Penicillins Rash   Past Medical History:  Diagnosis Date  . Atypical mole 06/24/1997   center back-moderate  . Atypical mole 06/18/2005   left arm,right upper back-slight  . Atypical mole 06/18/2005   lower mid back-moderate tx-widershave  . Atypical mole 06/17/2006   right lower abdomen-features of a deep nevus  . Atypical mole 06/25/2016   left thigh crease-mild  . Atypical mole 06/25/2016   mid chest-moderate  . Atypical mole 11/28/2017   left neck, right chest-moderate  . 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 Bipolar I disorder, most recent episode (or current) depressed   . Headache(784.0)   . Other and unspecified  hyperlipidemia   . Other atopic dermatitis and related conditions   . Squamous cell carcinoma of skin 12/14/2016   mid left back  . STD (sexually transmitted disease) 1989   HSV    Current Outpatient Medications:  .  Calcium Citrate-Vitamin D (CALCIUM + D PO), Take by mouth., Disp: , Rfl:  .  Ferrous Sulfate (IRON PO), Take by mouth., Disp: , Rfl:  .  fluorouracil (EFUDEX) 5 % cream, Apply topically daily. Apply to area qhs x 3-4 weeks as directed., Disp: 40 g, Rfl: 1 .  halobetasol (ULTRAVATE) 0.05 % cream, APPLY TO AFFECTED AREA ATEBEDTIME., Disp: , Rfl:  .  ibuprofen (IBU) 800 MG tablet, Take 1 tablet (800 mg total) by mouth every 8 (eight) hours as needed., Disp: 90 tablet, Rfl: 2 .  lithium carbonate 300 MG capsule, TAKE ONE CAPSULE BY MOUTH THREE TIMES DAILY WITH MEALS, Disp: 270 capsule, Rfl: 1 .  omeprazole (PRILOSEC) 20 MG capsule, Take 1 capsule (20 mg total) by mouth daily., Disp: 90 capsule, Rfl: 3 .  valACYclovir (VALTREX) 1000 MG tablet, Take 1 tablet (1,000 mg total) by mouth daily., Disp: 60 tablet, Rfl: 6 .  VITAMIN D PO, Take by mouth., Disp: , Rfl:  Social History   Socioeconomic History  . Marital status: Widowed    Spouse name: Not on file  . Number of children: 2  . Years of education: Not on file  . Highest education level: Not on file  Occupational History  .  Occupation: Financial planner: Ambulance person  Tobacco Use  . Smoking status: Never Smoker  . Smokeless tobacco: Never Used  Vaping Use  . Vaping Use: Never used  Substance and Sexual Activity  . Alcohol use: No  . Drug use: No  . Sexual activity: Not Currently    Partners: Male    Birth control/protection: Post-menopausal  Other Topics Concern  . Not on file  Social History Narrative   Patient is a retired Consulting civil engineer for [employer], retired 2019.   She has a granddaughter, whom she cares for during the week.   Social Determinants of Health   Financial Resource  Strain:   . Difficulty of Paying Living Expenses:   Food Insecurity:   . Worried About Charity fundraiser in the Last Year:   . Arboriculturist in the Last Year:   Transportation Needs:   . Film/video editor (Medical):   Lauren Kitchen Lack of Transportation (Non-Medical):   Physical Activity:   . Days of Exercise per Week:   . Minutes of Exercise per Session:   Stress:   . Feeling of Stress :   Social Connections:   . Frequency of Communication with Friends and Family:   . Frequency of Social Gatherings with Friends and Family:   . Attends Religious Services:   . Active Member of Clubs or Organizations:   . Attends Archivist Meetings:   Lauren Kitchen Marital Status:   Intimate Partner Violence:   . Fear of Current or Ex-Partner:   . Emotionally Abused:   Lauren Kitchen Physically Abused:   . Sexually Abused:    Family History  Problem Relation Age of Onset  . Heart disease Father   . Hypertension Father   . Thyroid disease Mother   . Osteoporosis Mother   . Colon cancer Maternal Grandmother 19  . Breast cancer Neg Hx     Objective: Office vital signs reviewed. BP 121/77   Pulse 64   Temp 97.8 F (36.6 C)   Ht 5' 2.25" (1.581 m)   Wt 138 lb (62.6 kg)   LMP 03/16/2010 (Approximate)   SpO2 100%   BMI 25.04 kg/m   Physical Examination:  General: Awake, alert, well nourished, No acute distress HEENT: Normal; sclera white.  No exophthalmos.  No goiter Cardio: regular rate and rhythm, S1S2 heard, no murmurs appreciated Pulm: clear to auscultation bilaterally, no wheezes, rhonchi or rales; normal work of breathing on room air Extremities: warm, well perfused, No edema, cyanosis or clubbing; +2 pulses bilaterally MSK: normal gait and station Skin: dry; intact; no rashes or lesions; normal temperature Neuro: Alert and oriented.  No tremor Psych: Mood stable, speech normal, affect appropriate, pleasant and interactive  Depression screen [facility] 2/9 07/07/2019 01/06/2019 07/07/2018  Decreased  Interest 0 0 0  Down, Depressed, Hopeless 0 0 0  PHQ - 2 Score 0 0 0  Altered sleeping 1 - -  Tired, decreased energy 1 - -  Change in appetite 0 - -  Feeling bad or failure about yourself  0 - -  Trouble concentrating 0 - -  Moving slowly or fidgety/restless 0 - -  Suicidal thoughts 0 - -  PHQ-9 Score 2 - -  Difficult doing work/chores Not difficult at all - -    Assessment/ Plan: 64 y.o. female   1. Encounter for lithium monitoring Stable.  Will check interval labs for lithium monitoring.  I cautioned her with the use of  Motrin as this can increase level of lithium. - PTH, Intact and Calcium - Renal Function Panel - TSH - Urinalysis - Lithium level  2. Bipolar I disorder, most recent episode (or current) depressed ([place]) - PTH, Intact and Calcium - Renal Function Panel - TSH - Urinalysis - lithium carbonate 300 MG capsule; TAKE ONE CAPSULE BY MOUTH THREE TIMES DAILY WITH MEALS  Dispense: 270 capsule; Refill: 1 - Lithium level  3. Establishing care with new doctor, encounter for  4. Gastroesophageal reflux disease with esophagitis without hemorrhage Controlled.  Continue Prilosec. - omeprazole (PRILOSEC) 20 MG capsule; Take 1 capsule (20 mg total) by mouth daily.  Dispense: 90 capsule; Refill: 3   Orders Placed This Encounter  Procedures  . PTH, Intact and Calcium  . Renal Function Panel  . TSH  . Urinalysis  . Lithium level   Meds ordered this encounter  Medications  . ibuprofen (IBU) 800 MG tablet    Sig: Take 1 tablet (800 mg total) by mouth every 8 (eight) hours as needed.    Dispense:  90 tablet    Refill:  2  . lithium carbonate 300 MG capsule    Sig: TAKE ONE CAPSULE BY MOUTH THREE TIMES DAILY WITH MEALS    Dispense:  270 capsule    Refill:  1  . omeprazole (PRILOSEC) 20 MG capsule    Sig: Take 1 capsule (20 mg total) by mouth daily.    Dispense:  90 capsule    Refill:  [place], [place] [phone]

## 2019-07-08 LAB — RENAL FUNCTION PANEL
Albumin: 4.3 g/dL (ref 3.8–4.8)
BUN/Creatinine Ratio: 13 (ref 12–28)
BUN: 11 mg/dL (ref 8–27)
CO2: 22 mmol/L (ref 20–29)
Calcium: 10.4 mg/dL — ABNORMAL HIGH (ref 8.7–10.3)
Chloride: 108 mmol/L — ABNORMAL HIGH (ref 96–106)
Creatinine, Ser: 0.87 mg/dL (ref 0.57–1.00)
GFR calc Af Amer: 83 mL/min/{1.73_m2} (ref >59)
GFR calc non Af Amer: 72 mL/min/{1.73_m2} (ref >59)
Glucose: 87 mg/dL (ref 65–99)
Phosphorus: 3.7 mg/dL (ref 3.0–4.3)
Potassium: 4.2 mmol/L (ref 3.5–5.2)
Sodium: 141 mmol/L (ref 134–144)

## 2019-07-08 LAB — TSH: TSH: 1.29 u[IU]/mL (ref 0.450–4.500)

## 2019-07-08 LAB — PTH, INTACT AND CALCIUM: PTH: 26 pg/mL (ref 15–65)

## 2019-07-08 LAB — LITHIUM LEVEL: Lithium Lvl: 0.9 mmol/L (ref 0.5–1.2)

## 2019-07-08 NOTE — Progress Notes (Signed)
Lmtcb.

## 2019-07-09 ENCOUNTER — Telehealth: Payer: Self-pay | Admitting: Family Medicine

## 2019-07-09 NOTE — Telephone Encounter (Signed)
Patient aware of lab results.

## 2019-07-22 ENCOUNTER — Ambulatory Visit: Payer: BC Managed Care – PPO | Admitting: Physician Assistant

## 2019-09-24 ENCOUNTER — Ambulatory Visit: Payer: BC Managed Care – PPO | Admitting: Physician Assistant

## 2019-10-01 ENCOUNTER — Other Ambulatory Visit: Payer: Self-pay

## 2019-10-01 ENCOUNTER — Ambulatory Visit: Payer: BC Managed Care – PPO | Admitting: Physician Assistant

## 2019-10-01 ENCOUNTER — Encounter: Payer: Self-pay | Admitting: Physician Assistant

## 2019-10-01 ENCOUNTER — Other Ambulatory Visit: Payer: Self-pay | Admitting: Cardiology

## 2019-10-01 ENCOUNTER — Other Ambulatory Visit: Payer: Self-pay | Admitting: Physical Medicine and Rehabilitation

## 2019-10-01 ENCOUNTER — Other Ambulatory Visit: Payer: Self-pay | Admitting: Obstetrics and Gynecology

## 2019-10-01 DIAGNOSIS — D224 Melanocytic nevi of scalp and neck: Secondary | ICD-10-CM

## 2019-10-01 DIAGNOSIS — Z1231 Encounter for screening mammogram for malignant neoplasm of breast: Secondary | ICD-10-CM

## 2019-10-01 DIAGNOSIS — Z86007 Personal history of in-situ neoplasm of skin: Secondary | ICD-10-CM

## 2019-10-01 DIAGNOSIS — L309 Dermatitis, unspecified: Secondary | ICD-10-CM | POA: Diagnosis not present

## 2019-10-01 DIAGNOSIS — L570 Actinic keratosis: Secondary | ICD-10-CM

## 2019-10-01 DIAGNOSIS — D225 Melanocytic nevi of trunk: Secondary | ICD-10-CM

## 2019-10-01 DIAGNOSIS — D229 Melanocytic nevi, unspecified: Secondary | ICD-10-CM

## 2019-10-01 DIAGNOSIS — D485 Neoplasm of uncertain behavior of skin: Secondary | ICD-10-CM | POA: Diagnosis not present

## 2019-10-01 MED ORDER — CLOBETASOL PROPIONATE 0.05 % EX SOLN: 1.0000 "application " | Freq: Every day | CUTANEOUS | 2 refills | Status: DC

## 2019-10-01 NOTE — Progress Notes (Signed)
   Follow up Visit  Subjective  [name] is a 64 y.o. female who presents for the following: Follow-up (10 week--Efudex helps and show a little improvement) and Skin Problem (spot on left hand--noticed for over 2 weeks). Her face did not extra red while she was on it. She did cheeks and nose. She noticed a new spot on her hand. It is rough. There is a spot on the right cheek. It has come up in the last 3 weeks. It has not bled and it isnt sore.   Objective  Well appearing patient in no apparent distress; mood and affect are within normal limits.  A focused examination was performed including face and hands. Relevant physical exam findings are noted in the Assessment and Plan.   Objective  Right Hand - Posterior: pInk crusted papule     Objective  Dorsum of Nose (2), Right Zygomatic Area (3): Erythematous patches with gritty scale.  Objective  Scalp: Ill-defined pink papules/plaques with scale-crust.   Assessment & Plan  Neoplasm of uncertain behavior of skin Right Hand - Posterior  Skin / nail biopsy Type of biopsy: tangential   Informed consent: discussed and consent obtained   Timeout: patient name, date of birth, surgical site, and procedure verified   Procedure prep:  Patient was prepped and draped in usual sterile fashion (Non sterile) Prep type:  Chlorhexidine Anesthesia: the lesion was anesthetized in a standard fashion   Anesthetic:  1% lidocaine w/ epinephrine 1-100,000 local infiltration Instrument used: flexible razor blade   Outcome: patient tolerated procedure well   Post-procedure details: wound care instructions given    Destruction of lesion Complexity: simple   Destruction method: electrodesiccation and curettage   Informed consent: discussed and consent obtained   Timeout:  patient name, date of birth, surgical site, and procedure verified Anesthesia: the lesion was anesthetized in a standard fashion   Anesthetic:  1% lidocaine w/ epinephrine  1-100,000 local infiltration Curettage performed in three different directions: Yes   Curettage cycles:  3 Lesion length (cm):  0 Lesion width (cm):  0.7 Margin per side (cm):  0 Final wound size (cm):  0.7 Hemostasis achieved with:  ferric subsulfate Outcome: patient tolerated procedure well with no complications   Additional details:  Wound innoculated with 5 fluorouracil solution.  Specimen 1 - Surgical pathology Differential Diagnosis: scc vs bcc Check Margins: No Tx pbx   AK (actinic keratosis) (5) Dorsum of Nose (2); Right Zygomatic Area (3)  Destruction of lesion - Dorsum of Nose, Right Zygomatic Area Complexity: simple   Destruction method: cryotherapy   Informed consent: discussed and consent obtained   Timeout:  patient name, date of birth, surgical site, and procedure verified Lesion destroyed using liquid nitrogen: Yes   Outcome: patient tolerated procedure well with no complications    Dermatitis Scalp  clobetasol (TEMOVATE) 0.05 % external solution - Scalp  Atypical mole (6) Chest - Medial (Center); Left Abdomen (side) - Lower; Right Upper Back; Mid Back (2); Left Anterior Neck  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

## 2019-10-01 NOTE — Patient Instructions (Signed)

## 2019-10-14 ENCOUNTER — Telehealth: Payer: Self-pay | Admitting: Physician Assistant

## 2019-10-14 NOTE — Telephone Encounter (Signed)
Left message for patient to call us back.  

## 2019-10-14 NOTE — Telephone Encounter (Signed)
Patient is calling for pathology results from last visit with Arlyss Gandy, PAC.

## 2019-10-14 NOTE — Telephone Encounter (Signed)
Pathology results to patient.  °

## 2019-12-29 NOTE — Progress Notes (Signed)
64 y.o. G65P2012 Widowed White or Caucasian female here for annual exam.   Caring for 15 month granddaughter 5 days a week. States that she has urinary frequency, no dysuria. Reports may be related to >3 cups coffee per day. It is not as bad in summer when she only has one cup of coffee  Recent new relationship, but not sexually active.  Worked for 26 years for [employer].      Patient's last menstrual period was 03/16/2010 (approximate).          Sexually active: No.  The current method of family planning is post menopausal status.    Exercising: Yes.    playing with granddaughter Smoker:  no  Health Maintenance: Pap:  11-19-13 neg HPV HR neg, 11-16-15 neg, 12-29-2018 neg HPV HR+ 16, 18/45 neg History of abnormal Pap:  yes MMG:  12-27-2018 category c density birads 1:neg, scheduled for today (12-31-2019) Colonoscopy:  2020 per patient (in [place]) polyps f/u 16yrs BMD:   2015 pcp manages TDaP:  2016 Gardasil:   n/a Covid-19:  Pneumonia vaccine(s):  2015 Shingrix:  done Hep C testing: not done Screening Labs: with PCP  poct urine-neg   reports that she has never smoked. She has never used smokeless tobacco. She reports that she does not drink alcohol and does not use drugs.  Past Medical History:  Diagnosis Date  . Atypical mole 06/24/1997   center back-moderate  . Atypical mole 06/18/2005   left arm,right upper back-slight  . Atypical mole 06/18/2005   lower mid back-moderate tx-widershave  . Atypical mole 06/17/2006   right lower abdomen-features of a deep nevus  . Atypical mole 06/25/2016   left thigh crease-mild  . Atypical mole 06/25/2016   mid chest-moderate  . Atypical mole 11/28/2017   left neck, right chest-moderate  . 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 Bipolar I disorder, most recent episode (or current) depressed   . Headache(784.0)   . Other and unspecified hyperlipidemia   . Other atopic dermatitis and related conditions   .  Squamous cell carcinoma of skin 12/14/2016   mid left back  . 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 CESAREAN SECTION     x 2  . WISDOM TOOTH EXTRACTION  age 73    Current Outpatient Medications  Medication Sig Dispense Refill  . Calcium Citrate-Vitamin D (CALCIUM + D PO) Take by mouth.    . Ferrous Sulfate (IRON PO) Take by mouth.    . halobetasol (ULTRAVATE) 0.05 % cream APPLY TO AFFECTED AREA ATEBEDTIME.    Marland Kitchen ibuprofen (IBU) 800 MG tablet Take 1 tablet (800 mg total) by mouth every 8 (eight) hours as needed. 90 tablet 2  . lithium carbonate 300 MG capsule TAKE ONE CAPSULE BY MOUTH THREE TIMES DAILY WITH MEALS 270 capsule 1  . omeprazole (PRILOSEC) 20 MG capsule Take 1 capsule (20 mg total) by mouth daily. 90 capsule 3  . valACYclovir (VALTREX) 1000 MG tablet Take 1 tablet (1,000 mg total) by mouth daily. 60 tablet 6  . VITAMIN D PO Take by mouth.     No current facility-administered medications for this visit.    Family History  Problem Relation Age of Onset  . Heart disease Father   . Hypertension Father   . Thyroid disease Mother   . Osteoporosis Mother   . Colon cancer Maternal Grandmother 69  .  Breast cancer Neg Hx     Review of Systems  Constitutional: Negative.   HENT: Negative.   Eyes: Negative.   Respiratory: Negative.   Cardiovascular: Negative.   Gastrointestinal: Negative.   Endocrine: Negative.   Genitourinary: Negative.   Musculoskeletal: Negative.   Skin: Negative.   Allergic/Immunologic: Negative.   Neurological: Negative.   Hematological: Negative.   Psychiatric/Behavioral: Negative.     Exam:   BP 114/70   Pulse 68   Resp 16   Ht 5' 2.75" (1.594 m)   Wt 139 lb (63 kg)   LMP 03/16/2010 (Approximate)   BMI 24.82 kg/m   Height: 5' 2.75" (159.4 cm)  General appearance: alert, cooperative and appears stated age Head: Normocephalic, without obvious abnormality,  atraumatic Neck: no adenopathy, supple, symmetrical, trachea midline and thyroid normal to inspection and palpation Lungs: clear to auscultation bilaterally Breasts: normal appearance, no masses or tenderness Heart: regular rate and rhythm Abdomen: soft, non-tender; bowel sounds normal; no masses,  no organomegaly Extremities: extremities normal, atraumatic, no cyanosis or edema Skin: Skin color, texture, turgor normal. Multiple nevi, spider veins Lymph nodes: Cervical, supraclavicular, and axillary nodes normal. No abnormal inguinal nodes palpated Neurologic: Grossly normal   Pelvic: External genitalia:  no lesions              Urethra:  normal appearing urethra with no masses, tenderness or lesions              Bartholins and Skenes: normal                 Vagina: normal appearing vagina for age, smooth, with normal color and discharge, no lesions              Cervix: no cervical motion tenderness and stenotic              Pap taken: Yes.   Bimanual Exam:  Uterus:  normal size, contour, position, consistency, mobility, non-tender              Adnexa: no mass, fullness, tenderness               Rectovaginal: Confirms               Anus:  normal sphincter tone, no lesions  Joy, CMA Chaperone was present for exam.  A:  Well Woman with normal exam  + HR HPV on 2020 pap (neg 16/18/45)  P:   Mammogram, will be going today  pap smear collected today (Co-testing)  Consider early DEXA considering risks (caucasian, small frame, high caffeine use). Pt will discuss with PCP  return annually or prn

## 2019-12-30 ENCOUNTER — Ambulatory Visit: Payer: BC Managed Care – PPO | Admitting: Certified Nurse Midwife

## 2019-12-31 ENCOUNTER — Other Ambulatory Visit (HOSPITAL_COMMUNITY)
Admission: RE | Admit: 2019-12-31 | Discharge: 2019-12-31 | Disposition: A | Discharge status: Home / Self Care | Payer: BC Managed Care – PPO | Source: Ambulatory Visit | Attending: Nurse Practitioner | Admitting: Nurse Practitioner

## 2019-12-31 ENCOUNTER — Ambulatory Visit (INDEPENDENT_AMBULATORY_CARE_PROVIDER_SITE_OTHER): Payer: BC Managed Care – PPO | Admitting: Nurse Practitioner

## 2019-12-31 ENCOUNTER — Ambulatory Visit
Admission: RE | Admit: 2019-12-31 | Discharge: 2019-12-31 | Disposition: A | Discharge status: Home / Self Care | Payer: BC Managed Care – PPO | Source: Ambulatory Visit | Attending: Obstetrics and Gynecology | Admitting: Obstetrics and Gynecology

## 2019-12-31 ENCOUNTER — Ambulatory Visit: Payer: BC Managed Care – PPO | Admitting: Physician Assistant

## 2019-12-31 ENCOUNTER — Other Ambulatory Visit: Payer: Self-pay

## 2019-12-31 ENCOUNTER — Encounter: Payer: Self-pay | Admitting: Nurse Practitioner

## 2019-12-31 VITALS — BP 114/70 | HR 68 | Resp 16 | Ht 62.75 in | Wt 139.0 lb

## 2019-12-31 DIAGNOSIS — R350 Frequency of micturition: Secondary | ICD-10-CM

## 2019-12-31 DIAGNOSIS — R87612 Low grade squamous intraepithelial lesion on cytologic smear of cervix (LGSIL): Secondary | ICD-10-CM | POA: Diagnosis not present

## 2019-12-31 DIAGNOSIS — Z01419 Encounter for gynecological examination (general) (routine) without abnormal findings: Secondary | ICD-10-CM

## 2019-12-31 DIAGNOSIS — R87810 Cervical high risk human papillomavirus (HPV) DNA test positive: Secondary | ICD-10-CM | POA: Insufficient documentation

## 2019-12-31 DIAGNOSIS — Z1151 Encounter for screening for human papillomavirus (HPV): Secondary | ICD-10-CM | POA: Diagnosis not present

## 2019-12-31 DIAGNOSIS — Z1231 Encounter for screening mammogram for malignant neoplasm of breast: Secondary | ICD-10-CM

## 2019-12-31 DIAGNOSIS — Z0000 Encounter for general adult medical examination without abnormal findings: Secondary | ICD-10-CM | POA: Diagnosis not present

## 2019-12-31 DIAGNOSIS — B977 Papillomavirus as the cause of diseases classified elsewhere: Secondary | ICD-10-CM

## 2019-12-31 LAB — POCT URINALYSIS DIPSTICK
Bilirubin, UA: NEGATIVE
Blood, UA: NEGATIVE
Glucose, UA: NEGATIVE
Ketones, UA: NEGATIVE
Leukocytes, UA: NEGATIVE
Nitrite, UA: NEGATIVE
Protein, UA: NEGATIVE
Urobilinogen, UA: NEGATIVE E.U./dL — AB
pH, UA: 5 (ref 5.0–8.0)

## 2019-12-31 NOTE — Patient Instructions (Signed)

## 2020-01-01 LAB — CYTOLOGY - PAP
Comment: NEGATIVE
High risk HPV: POSITIVE — AB

## 2020-01-04 ENCOUNTER — Telehealth: Payer: Self-pay

## 2020-01-04 DIAGNOSIS — R87612 Low grade squamous intraepithelial lesion on cytologic smear of cervix (LGSIL): Secondary | ICD-10-CM

## 2020-01-04 DIAGNOSIS — R87810 Cervical high risk human papillomavirus (HPV) DNA test positive: Secondary | ICD-10-CM

## 2020-01-04 NOTE — Telephone Encounter (Signed)
Patient is calling in regards to pap results and possible biopsy.

## 2020-01-04 NOTE — Telephone Encounter (Signed)
Spoke with pt. Pt given results and recommendations per Claiborne Billings, NP from [facility]. Pt agreeable and verbalized understanding. Pt scheduled for [facility] on 12/28 at 230 pm with Dr Talbert Nan as first available appt and provider. Pt agreeable to date and time of appt. Cancellation policy reviewed. Pt advised to take Motrin 800 mg with food and water one hour before procedure.  Routing to Dr Talbert Nan for review Cc: Kelly,NP for update Cc: Hayley for precert  Orders placed  Encounter closed

## 2020-01-06 ENCOUNTER — Ambulatory Visit: Payer: BC Managed Care – PPO | Admitting: Family Medicine

## 2020-01-06 ENCOUNTER — Encounter: Payer: Self-pay | Admitting: Family Medicine

## 2020-01-06 ENCOUNTER — Other Ambulatory Visit: Payer: Self-pay

## 2020-01-06 VITALS — BP 107/66 | HR 63 | Temp 98.0°F | Ht 62.75 in | Wt 139.8 lb

## 2020-01-06 DIAGNOSIS — F3130 Bipolar disorder, current episode depressed, mild or moderate severity, unspecified: Secondary | ICD-10-CM | POA: Diagnosis not present

## 2020-01-06 DIAGNOSIS — M8588 Other specified disorders of bone density and structure, other site: Secondary | ICD-10-CM

## 2020-01-06 DIAGNOSIS — E7800 Pure hypercholesterolemia, unspecified: Secondary | ICD-10-CM | POA: Diagnosis not present

## 2020-01-06 DIAGNOSIS — M7582 Other shoulder lesions, left shoulder: Secondary | ICD-10-CM | POA: Insufficient documentation

## 2020-01-06 DIAGNOSIS — I83812 Varicose veins of left lower extremities with pain: Secondary | ICD-10-CM

## 2020-01-06 DIAGNOSIS — Z79899 Other long term (current) drug therapy: Secondary | ICD-10-CM

## 2020-01-06 DIAGNOSIS — Z5181 Encounter for therapeutic drug level monitoring: Secondary | ICD-10-CM | POA: Diagnosis not present

## 2020-01-06 DIAGNOSIS — K2100 Gastro-esophageal reflux disease with esophagitis, without bleeding: Secondary | ICD-10-CM

## 2020-01-06 DIAGNOSIS — H65191 Other acute nonsuppurative otitis media, right ear: Secondary | ICD-10-CM

## 2020-01-06 LAB — URINALYSIS
Bilirubin, UA: NEGATIVE
Glucose, UA: NEGATIVE
Ketones, UA: NEGATIVE
Nitrite, UA: NEGATIVE
Protein,UA: NEGATIVE
RBC, UA: NEGATIVE
Specific Gravity, UA: 1.015 (ref 1.005–1.030)
Urobilinogen, Ur: 0.2 mg/dL (ref 0.2–1.0)
pH, UA: 8.5 — ABNORMAL HIGH (ref 5.0–7.5)

## 2020-01-06 MED ORDER — LITHIUM CARBONATE 300 MG PO CAPS: ORAL_CAPSULE | ORAL | 1 refills | Status: DC

## 2020-01-06 MED ORDER — OMEPRAZOLE 20 MG PO CPDR: 20.0000 mg | DELAYED_RELEASE_CAPSULE | Freq: Every day | ORAL | 3 refills | Status: DC

## 2020-01-06 MED ORDER — IBUPROFEN 800 MG PO TABS: 800.0000 mg | ORAL_TABLET | Freq: Three times a day (TID) | ORAL | 3 refills | Status: DC | PRN

## 2020-01-06 NOTE — Progress Notes (Signed)
Subjective: CC: Follow-up bipolar disorder PCP: Janora Norlander, DO HX:[id number] B [name] is a 64 y.o. female presenting to clinic today for:  1.  Bipolar disorder Patient is compliant with lithium 3 times daily.  Occasionally she will forget a day but she is not had any exacerbation of symptoms.  She does take Motrin intervally but denies any GI symptoms.  Her lithium levels have been appropriate.  2.  Painful vein Patient reports history of previous vascular procedure on the left posterior calf.  She is been having some increasing but intermittent pain in the same area on the left calf.  Denies any calf swelling, erythema or heat  3.  Left shoulder Patient reports left shoulder pain that is intermittent but sharp.  She primarily carries her grandchildren with that left arm but does try to switch out.  Not currently doing anything except for Advil intermittently.  No reports of weakness or sensory changes in that arm  4.  Ear fullness Patient reports feeling like she has water in her right ear.  It is fulfilling.  Does not report any dizziness.  She tried using a Q-tip to improve this.  Not currently taking any antihistamines.  ROS: Per HPI  Allergies  Allergen Reactions   Penicillins Rash   Past Medical History:  Diagnosis Date   Atypical mole 06/24/1997   center back-moderate   Atypical mole 06/18/2005   left arm,right upper back-slight   Atypical mole 06/18/2005   lower mid back-moderate tx-widershave   Atypical mole 06/17/2006   right lower abdomen-features of a deep nevus   Atypical mole 06/25/2016   left thigh crease-mild   Atypical mole 06/25/2016   mid chest-moderate   Atypical mole 11/28/2017   left neck, right chest-moderate   Benign neoplasm of colon 04/2008   tubular adenoma   Bipolar I disorder, most recent episode (or current) depressed    Headache(784.0)    Other and unspecified hyperlipidemia    Other atopic dermatitis and related  conditions    Squamous cell carcinoma of skin 12/14/2016   mid left back   STD (sexually transmitted disease) 1989   HSV    Current Outpatient Medications:    Calcium Citrate-Vitamin D (CALCIUM + D PO), Take by mouth., Disp: , Rfl:    Ferrous Sulfate (IRON PO), Take by mouth., Disp: , Rfl:    halobetasol (ULTRAVATE) 0.05 % cream, APPLY TO AFFECTED AREA ATEBEDTIME., Disp: , Rfl:    ibuprofen (IBU) 800 MG tablet, Take 1 tablet (800 mg total) by mouth every 8 (eight) hours as needed., Disp: 90 tablet, Rfl: 2   lithium carbonate 300 MG capsule, TAKE ONE CAPSULE BY MOUTH THREE TIMES DAILY WITH MEALS, Disp: 270 capsule, Rfl: 1   omeprazole (PRILOSEC) 20 MG capsule, Take 1 capsule (20 mg total) by mouth daily., Disp: 90 capsule, Rfl: 3   valACYclovir (VALTREX) 1000 MG tablet, Take 1 tablet (1,000 mg total) by mouth daily., Disp: 60 tablet, Rfl: 6   VITAMIN D PO, Take by mouth., Disp: , Rfl:  Social History   Socioeconomic History   Marital status: Widowed    Spouse name: Not on file   Number of children: 2   Years of education: Not on file   Highest education level: Not on file  Occupational History   Occupation: TEACHER ASSISTANT    Employer: [employer]  Tobacco Use   Smoking status: Never Smoker   Smokeless tobacco: Never Used  Scientific laboratory technician  Use: Never used  Substance and Sexual Activity   Alcohol use: No   Drug use: No   Sexual activity: Not Currently    Partners: Male    Birth control/protection: Post-menopausal  Other Topics Concern   Not on file  Social History Narrative   Patient is a retired Consulting civil engineer for [employer], retired 2019.   She has a granddaughter, whom she cares for during the week.   Social Determinants of Health   Financial Resource Strain: Not on file  Food Insecurity: Not on file  Transportation Needs: Not on file  Physical Activity: Not on file  Stress: Not on file  Social Connections: Not on file   Intimate Partner Violence: Not on file   Family History  Problem Relation Age of Onset   Heart disease Father    Hypertension Father    Thyroid disease Mother    Osteoporosis Mother    Colon cancer Maternal Grandmother 43   Breast cancer Neg Hx     Objective: Office vital signs reviewed. BP 107/66    Pulse 63    Temp 98 F (36.7 C)    Ht 5' 2.75" (1.594 m)    Wt 139 lb 12.8 oz (63.4 kg)    LMP 03/16/2010 (Approximate)    SpO2 100%    BMI 24.96 kg/m   Physical Examination:  General: Awake, alert, well nourished, No acute distress HEENT: Normal; sclera white.  Left TM with appreciable effusion behind it.  This is clear and no evidence of secondary bacterial infection.  No bulging, perforation or erythema. Cardio: regular rate and rhythm, S1S2 heard  Pulm: clear to auscultation bilaterally, no wheezes, rhonchi or rales; normal work of breathing on room air Extremities: warm, well perfused, No edema, cyanosis or clubbing; +2 pulses bilaterally MSK: normal gait and station; she apparently has full active range of motion of the left shoulder.  No visible deformities Skin: dry; she has a raw area at the opening of her right nare.  No overt ulceration, vesicle or other lesion appreciated there.  Spider veins noted along the posterior upper calf.  No erythema or ulceration Neuro: No tremor Psych: Mood is stable, speech is normal, affect appropriate.  Patient is pleasant and interactive  Assessment/ Plan: 64 y.o. female   Bipolar I disorder, most recent episode (or current) depressed ([place]) - Plan: lithium carbonate 300 MG capsule  Encounter for lithium monitoring - Plan: Lithium level, TSH, PTH, Intact and Calcium, Urinalysis, Renal Function Panel  Pure hypercholesterolemia - Plan: Lipid Panel  Osteopenia of lumbar spine - Plan: DG [facility] DEXA  Varicose veins of leg with pain, left  Acute effusion of right ear  Tendinitis of left rotator cuff  Gastroesophageal reflux disease  with esophagitis without hemorrhage - Plan: omeprazole (PRILOSEC) 20 MG capsule  Symptoms well controlled.  Check lithium level, PTH, TSH, calcium and renal function.  UA also ordered.  Check lipid panel as she has had hyperlipidemia in the past but is not currently treated with any lipid-lowering medication  She will come in for DEXA scan sometime next week.  Last DEXA was performed in 2015 and noted to have osteopenia of the lumbar spine at that time.  She has visible varicose veins of the left leg.  They are not especially large nor did I appreciate any ulceration or swelling.  Could consider referral to vascular surgery for reevaluation if she desires in the future  Her ear showed an effusion.  No evidence of secondary  bacterial infection.  Encouraged use of Claritin or other antihistamine to help with this.  We discussed reasons for reevaluation  I suspect a rotator cuff tendinitis on the left due to overuse and lifting her grandchild.  Okay to use Motrin as long as she is not having any concerning side effects, particular given concomitant use of lithium.  Could consider formal physical therapy versus referral to orthopedics for joint injection if needed at some point but given intermittent nature I think that she can likely improve with supportive care at home  GERD is well controlled.  PPI was renewed   Orders Placed This Encounter  Procedures   Lithium level   TSH   PTH, Intact and Calcium   Urinalysis   Renal Function Panel   Lipid Panel   No orders of the defined types were placed in this encounter.    Janora Norlander, DO [place] [phone]

## 2020-01-07 LAB — LITHIUM LEVEL: Lithium Lvl: 1 mmol/L (ref 0.5–1.2)

## 2020-01-07 LAB — RENAL FUNCTION PANEL
Albumin: 4.3 g/dL (ref 3.8–4.8)
BUN/Creatinine Ratio: 11 — ABNORMAL LOW (ref 12–28)
BUN: 9 mg/dL (ref 8–27)
CO2: 22 mmol/L (ref 20–29)
Calcium: 10.3 mg/dL (ref 8.7–10.3)
Chloride: 105 mmol/L (ref 96–106)
Creatinine, Ser: 0.81 mg/dL (ref 0.57–1.00)
GFR calc Af Amer: 90 mL/min/{1.73_m2} (ref >59)
GFR calc non Af Amer: 78 mL/min/{1.73_m2} (ref >59)
Glucose: 91 mg/dL (ref 65–99)
Phosphorus: 3.2 mg/dL (ref 3.0–4.3)
Potassium: 4.3 mmol/L (ref 3.5–5.2)
Sodium: 142 mmol/L (ref 134–144)

## 2020-01-07 LAB — LIPID PANEL
Chol/HDL Ratio: 3.9 ratio (ref 0.0–4.4)
Cholesterol, Total: 200 mg/dL — ABNORMAL HIGH (ref 100–199)
HDL: 51 mg/dL (ref >39)
LDL Chol Calc (NIH): 120 mg/dL — ABNORMAL HIGH (ref 0–99)
Triglycerides: 165 mg/dL — ABNORMAL HIGH (ref 0–149)
VLDL Cholesterol Cal: 29 mg/dL (ref 5–40)

## 2020-01-07 LAB — PTH, INTACT AND CALCIUM: PTH: 37 pg/mL (ref 15–65)

## 2020-01-07 LAB — TSH: TSH: 1.79 u[IU]/mL (ref 0.450–4.500)

## 2020-01-12 ENCOUNTER — Ambulatory Visit: Payer: BC Managed Care – PPO | Admitting: Obstetrics and Gynecology

## 2020-01-12 ENCOUNTER — Ambulatory Visit (INDEPENDENT_AMBULATORY_CARE_PROVIDER_SITE_OTHER): Payer: BC Managed Care – PPO

## 2020-01-12 ENCOUNTER — Other Ambulatory Visit (HOSPITAL_COMMUNITY)
Admission: RE | Admit: 2020-01-12 | Discharge: 2020-01-12 | Disposition: A | Discharge status: Home / Self Care | Payer: BC Managed Care – PPO | Source: Ambulatory Visit | Attending: Obstetrics and Gynecology | Admitting: Obstetrics and Gynecology

## 2020-01-12 ENCOUNTER — Other Ambulatory Visit: Payer: Self-pay

## 2020-01-12 ENCOUNTER — Encounter: Payer: Self-pay | Admitting: Obstetrics and Gynecology

## 2020-01-12 VITALS — BP 100/64 | HR 69 | Ht 65.0 in | Wt 140.3 lb

## 2020-01-12 DIAGNOSIS — R87612 Low grade squamous intraepithelial lesion on cytologic smear of cervix (LGSIL): Secondary | ICD-10-CM | POA: Insufficient documentation

## 2020-01-12 DIAGNOSIS — R87810 Cervical high risk human papillomavirus (HPV) DNA test positive: Secondary | ICD-10-CM

## 2020-01-12 DIAGNOSIS — N882 Stricture and stenosis of cervix uteri: Secondary | ICD-10-CM

## 2020-01-12 DIAGNOSIS — M8588 Other specified disorders of bone density and structure, other site: Secondary | ICD-10-CM | POA: Diagnosis not present

## 2020-01-12 DIAGNOSIS — N952 Postmenopausal atrophic vaginitis: Secondary | ICD-10-CM

## 2020-01-12 MED ORDER — MISOPROSTOL 200 MCG PO TABS: ORAL_TABLET | ORAL | 0 refills | Status: DC

## 2020-01-12 MED ORDER — ESTRADIOL 0.1 MG/GM VA CREA: TOPICAL_CREAM | VAGINAL | 0 refills | Status: DC

## 2020-01-12 NOTE — Patient Instructions (Signed)

## 2020-01-12 NOTE — Progress Notes (Signed)
GYNECOLOGY  VISIT   HPI: 64 y.o.   Widowed White or Caucasian Not Hispanic or Latino  female   [phone] with Patient's last menstrual period was 03/16/2010 (approximate).   here for Colposcopy. Patient states that she took 800mg  Ibuprofen at 1330 today.  Recent pap with LSIL, +HPV Pap from 12/20 was negative with +HPV (negative 16/18/45). Husband died in 1998/05/02. She has had 2 partners since then. Not currently sexually active (hasn't been for several months)  GYNECOLOGIC HISTORY: Patient's last menstrual period was 03/16/2010 (approximate). Contraception:PMP  Menopausal hormone therapy: none         OB History    Gravida  3   Para  2   Term  2   Preterm  0   AB  1   Living  2     SAB  0   IAB  1   Ectopic  0   Multiple  0   Live Births  2              Patient Active Problem List   Diagnosis Date Noted  . Tendinitis of left rotator cuff 01/06/2020  . Gastroesophageal reflux disease with esophagitis 01/05/2018  . Varicose veins of leg with pain, left 01/31/2017  . Osteopenia 10/14/2013  . Vitamin D deficiency 03/23/2013  . Bipolar I disorder, most recent episode (or current) depressed ([facility])   . Benign neoplasm of colon   . Headache(784.0)   . Other atopic dermatitis and related conditions   . Hyperlipidemia     Past Medical History:  Diagnosis Date  . Atypical mole 06/24/1997   center back-moderate  . Atypical mole 06/18/2005   left arm,right upper back-slight  . Atypical mole 06/18/2005   lower mid back-moderate tx-widershave  . Atypical mole 06/17/2006   right lower abdomen-features of a deep nevus  . Atypical mole 06/25/2016   left thigh crease-mild  . Atypical mole 06/25/2016   mid chest-moderate  . Atypical mole 11/28/2017   left neck, right chest-moderate  . Benign neoplasm of colon May 01, 2008   tubular adenoma  . Bipolar I disorder, most recent episode (or current) depressed   . Headache(784.0)   . Other and unspecified hyperlipidemia   .  Other atopic dermatitis and related conditions   . Squamous cell carcinoma of skin 12/14/2016   mid left back  . 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 CESAREAN SECTION     x 2  . WISDOM TOOTH EXTRACTION  age 34    Current Outpatient Medications  Medication Sig Dispense Refill  . Calcium Citrate-Vitamin D (CALCIUM + D PO) Take by mouth.    . Ferrous Sulfate (IRON PO) Take by mouth.    . halobetasol (ULTRAVATE) 0.05 % cream APPLY TO AFFECTED AREA ATEBEDTIME.    15 ibuprofen (IBU) 800 MG tablet Take 1 tablet (800 mg total) by mouth every 8 (eight) hours as needed. 90 tablet 3  . lithium carbonate 300 MG capsule TAKE ONE CAPSULE BY MOUTH THREE TIMES DAILY WITH MEALS 270 capsule 1  . omeprazole (PRILOSEC) 20 MG capsule Take 1 capsule (20 mg total) by mouth daily. 90 capsule 3  . valACYclovir (VALTREX) 1000 MG tablet Take 1 tablet (1,000 mg total) by mouth daily. 60 tablet 6  . VITAMIN D PO Take by mouth.     No current facility-administered medications for this visit.     ALLERGIES: Penicillins  Family History  Problem Relation Age of Onset  . Heart disease Father   . Hypertension Father   . Thyroid disease Mother   . Osteoporosis Mother   . Colon cancer Maternal Grandmother 74  . Breast cancer Neg Hx     Social History   Socioeconomic History  . Marital status: Widowed    Spouse name: Not on file  . Number of children: 2  . Years of education: Not on file  . Highest education level: Not on file  Occupational History  . Occupation: Financial planner: Ambulance person  Tobacco Use  . Smoking status: Never Smoker  . Smokeless tobacco: Never Used  Vaping Use  . Vaping Use: Never used  Substance and Sexual Activity  . Alcohol use: No  . Drug use: No  . Sexual activity: Not Currently    Partners: Male    Birth control/protection: Post-menopausal  Other Topics Concern  . Not on file   Social History Narrative   Patient is a retired Consulting civil engineer for [employer], retired 2019.   She has a granddaughter, whom she cares for during the week.   Social Determinants of Health   Financial Resource Strain: Not on file  Food Insecurity: Not on file  Transportation Needs: Not on file  Physical Activity: Not on file  Stress: Not on file  Social Connections: Not on file  Intimate Partner Violence: Not on file    Review of Systems  All other systems reviewed and are negative.   PHYSICAL EXAMINATION:    BP 100/64   Pulse 69   Ht 5\' 5"  (1.651 m)   Wt 140 lb 4.8 oz (63.6 kg)   LMP 03/16/2010 (Approximate)   SpO2 100%   BMI 23.35 kg/m     General appearance: alert, cooperative and appears stated age  Pelvic: External genitalia:  no lesions              Urethra:  normal appearing urethra with no masses, tenderness or lesions              Bartholins and Skenes: normal                 Vagina: atrophic appearing vagina with normal color and discharge, no lesions              Cervix: flush with vagina, stenotic, pinpoint dimple, not open.                 Colposcopy: unsatisfactory, no aceto-white changes, diffuse decrease in lugols uptake with atrophy. Vaginal biopsies from 3 and 9 o'clock, cervical biopsy from 9 o'clock. Hemostasis achieved with silver nitrate and pressure, monsels applied  Chaperone was present for exam.  ASSESSMENT LSIL, +HPV, unsatisfactory colposcopy with severe cervical stenosis.    PLAN Vaginal and cervical biopsies taken Will treat with estrogen for one month The f/u for cervical dilation and ECC, will pretreat with cytotec.    An After Visit Summary was printed and given to the patient.  CC: Karma Ganja

## 2020-01-13 ENCOUNTER — Telehealth: Payer: Self-pay

## 2020-01-13 NOTE — Telephone Encounter (Signed)
Patient had done yesterday - waiting on report

## 2020-01-18 LAB — SURGICAL PATHOLOGY

## 2020-02-15 ENCOUNTER — Telehealth: Payer: Self-pay

## 2020-02-15 NOTE — Telephone Encounter (Signed)
Patient called to ask if she should take Ibuprofen again prior to he follow up visit on Weds. I advised her if it helped her last time it would probably be a good idea to take it this time.

## 2020-02-17 ENCOUNTER — Encounter: Payer: Self-pay | Admitting: Obstetrics and Gynecology

## 2020-02-17 ENCOUNTER — Ambulatory Visit (INDEPENDENT_AMBULATORY_CARE_PROVIDER_SITE_OTHER): Payer: BC Managed Care – PPO | Admitting: Obstetrics and Gynecology

## 2020-02-17 ENCOUNTER — Other Ambulatory Visit: Payer: Self-pay

## 2020-02-17 VITALS — BP 114/70 | HR 72 | Ht 65.0 in | Wt 140.0 lb

## 2020-02-17 DIAGNOSIS — N882 Stricture and stenosis of cervix uteri: Secondary | ICD-10-CM | POA: Diagnosis not present

## 2020-02-17 DIAGNOSIS — R87612 Low grade squamous intraepithelial lesion on cytologic smear of cervix (LGSIL): Secondary | ICD-10-CM | POA: Diagnosis not present

## 2020-02-17 DIAGNOSIS — R87810 Cervical high risk human papillomavirus (HPV) DNA test positive: Secondary | ICD-10-CM

## 2020-02-17 NOTE — Progress Notes (Addendum)
GYNECOLOGY  VISIT   HPI: 65 y.o.   Widowed White or Caucasian Not Hispanic or Latino  female   [phone] with Patient's last menstrual period was 03/16/2010 (approximate).   here for 1 month follow-up and ECC.    The patient was seen last month for a colposcopy for LSIL, +HPV pap. Her cervix was very stenotic, unable to dilate and do an ECC. Vaginal biopsies were negative. She was treated with vaginal estrogen for one month and used cytotec last night.   GYNECOLOGIC HISTORY: Patient's last menstrual period was 03/16/2010 (approximate). Contraception:PMP Menopausal hormone therapy: Estradiol cream        OB History     Gravida  3   Para  2   Term  2   Preterm  0   AB  1   Living  2      SAB  0   IAB  1   Ectopic  0   Multiple  0   Live Births  2              Patient Active Problem List   Diagnosis Date Noted   Tendinitis of left rotator cuff 01/06/2020   Gastroesophageal reflux disease with esophagitis 01/05/2018   Varicose veins of leg with pain, left 01/31/2017   Osteopenia 10/14/2013   Vitamin D deficiency 03/23/2013   Bipolar I disorder, most recent episode (or current) depressed ([place])    Benign neoplasm of colon    Headache(784.0)    Other atopic dermatitis and related conditions    Hyperlipidemia     Past Medical History:  Diagnosis Date   Atypical mole 06/24/1997   center back-moderate   Atypical mole 06/18/2005   left arm,right upper back-slight   Atypical mole 06/18/2005   lower mid back-moderate tx-widershave   Atypical mole 06/17/2006   right lower abdomen-features of a deep nevus   Atypical mole 06/25/2016   left thigh crease-mild   Atypical mole 06/25/2016   mid chest-moderate   Atypical mole 11/28/2017   left neck, right chest-moderate   Benign neoplasm of colon 04/2008   tubular adenoma   Bipolar I disorder, most recent episode (or current) depressed    Headache(784.0)    Other and unspecified hyperlipidemia    Other atopic  dermatitis and related conditions    Squamous cell carcinoma of skin 12/14/2016   mid left back   STD (sexually transmitted disease) 1989   HSV    Past Surgical History:  Procedure Laterality Date   CESAREAN SECTION     x 2   WISDOM TOOTH EXTRACTION  age 27    Current Outpatient Medications  Medication Sig Dispense Refill   Calcium Citrate-Vitamin D (CALCIUM + D PO) Take by mouth.     estradiol (ESTRACE) 0.1 MG/GM vaginal cream 1 gram vaginally every night for one week, then twice weekly at hs 42.5 g 0   Ferrous Sulfate (IRON PO) Take by mouth.     ibuprofen (IBU) 800 MG tablet Take 1 tablet (800 mg total) by mouth every 8 (eight) hours as needed. 90 tablet 3   lithium carbonate 300 MG capsule TAKE ONE CAPSULE BY MOUTH THREE TIMES DAILY WITH MEALS 270 capsule 1   omeprazole (PRILOSEC) 20 MG capsule Take 1 capsule (20 mg total) by mouth daily. 90 capsule 3   valACYclovir (VALTREX) 1000 MG tablet Take 1 tablet (1,000 mg total) by mouth daily. 60 tablet 6   VITAMIN D PO Take by mouth.  halobetasol (ULTRAVATE) 0.05 % cream APPLY TO AFFECTED AREA ATEBEDTIME. (Patient not taking: Reported on 02/17/2020)     No current facility-administered medications for this visit.     ALLERGIES: Penicillins  Family History  Problem Relation Age of Onset   Heart disease Father    Hypertension Father    Thyroid disease Mother    Osteoporosis Mother    Colon cancer Maternal Grandmother 62   Breast cancer Neg Hx     Social History   Socioeconomic History   Marital status: Widowed    Spouse name: Not on file   Number of children: 2   Years of education: Not on file   Highest education level: Not on file  Occupational History   Occupation: TEACHER ASSISTANT    Employer: [employer]  Tobacco Use   Smoking status: Never Smoker   Smokeless tobacco: Never Used  Scientific laboratory technician Use: Never used  Substance and Sexual Activity   Alcohol use: No   Drug use: No   Sexual  activity: Not Currently    Partners: Male    Birth control/protection: Post-menopausal  Other Topics Concern   Not on file  Social History Narrative   Patient is a retired Consulting civil engineer for [employer], retired 2019.   She has a granddaughter, whom she cares for during the week.   Social Determinants of Health   Financial Resource Strain: Not on file  Food Insecurity: Not on file  Transportation Needs: Not on file  Physical Activity: Not on file  Stress: Not on file  Social Connections: Not on file  Intimate Partner Violence: Not on file    Review of Systems  Constitutional: Negative.   HENT: Negative.   Eyes: Negative.   Respiratory: Negative.   Cardiovascular: Negative.   Gastrointestinal: Negative.   Genitourinary: Negative.   Musculoskeletal: Negative.   Skin: Negative.   Neurological: Negative.   Endo/Heme/Allergies: Negative.   Psychiatric/Behavioral: Negative.     PHYSICAL EXAMINATION:    BP 114/70 (BP Location: Left Arm, Patient Position: Sitting, Cuff Size: Normal)   Pulse 72   Ht 5\' 5"  (1.651 m)   Wt 140 lb (63.5 kg)   LMP 03/16/2010 (Approximate)   BMI 23.30 kg/m     General appearance: alert, cooperative and appears stated age   Pelvic: External genitalia:  no lesions              Urethra:  normal appearing urethra with no masses, tenderness or lesions              Bartholins and Skenes: normal                 Vagina: atrophic appearing vagina with normal color and discharge, no lesions              Cervix: no lesions and flush with vagina, stenotic, no clear os seen.               Bimanual Exam:  Uterus:  normal size, contour, position, consistency, mobility, non-tender                Procedure: Speculum was placed in the vagina and the cervix was cleansed with betadine. A tenaculum was placed. There is a small area on her cervix that has a ?slight dimple. Attempted to pass mini-dilator without success.  Tenaculum and speculum were removed.    Chaperone was present for exam.  1. LGSIL on Pap smear of cervix Colposcopy last  month with negative vaginal biopsies  2. Cervical high risk HPV (human papillomavirus) test positive As above  3. Cervical stenosis (uterine cervix) The patient was treated with vaginal estrogen x one month and pretreated with cytotec, still unable to dilate her cervix (flush with her vagina).  Will seek advice of GYN Oncology  Over 20 minutes in total patient care.   Lafonda Mosses, MD  Salvadore Dom, MD W[facility] Fillers,  I think you have a couple of options.  One would be to add on the subtype of HPV. If she is not 16 or 18 positive, then it's reasonable on these patients just repeat cotesting in a year.  One of your biopsies looks like you got some endocervical cells. I think if her os is really that stenotic, the Pap test seems unlikely to have captured some thing going on within the Endocervix. Given your colposcopy findings as well as biopsy results, I think it would also be very reasonable just to plan to cotest her in one year. I would plan to keep her on vaginal estrogen over the next year to treat any low-grade disease that might be there.  The most aggressive option, and not the one that I would favor, would be an excisional procedure. I think without something telling you that she has high-grade disease or persistent HPV infection, this is necessary.  Best,  Wendelyn Breslow

## 2020-03-01 ENCOUNTER — Ambulatory Visit: Payer: BC Managed Care – PPO | Admitting: Physician Assistant

## 2020-03-24 ENCOUNTER — Encounter: Payer: Self-pay | Admitting: Physician Assistant

## 2020-03-24 ENCOUNTER — Ambulatory Visit: Payer: BC Managed Care – PPO | Admitting: Physician Assistant

## 2020-03-24 ENCOUNTER — Other Ambulatory Visit: Payer: Self-pay

## 2020-03-24 DIAGNOSIS — L570 Actinic keratosis: Secondary | ICD-10-CM | POA: Diagnosis not present

## 2020-03-24 DIAGNOSIS — Z85828 Personal history of other malignant neoplasm of skin: Secondary | ICD-10-CM | POA: Diagnosis not present

## 2020-03-24 DIAGNOSIS — D1800 Hemangioma unspecified site: Secondary | ICD-10-CM | POA: Diagnosis not present

## 2020-03-24 DIAGNOSIS — Z86018 Personal history of other benign neoplasm: Secondary | ICD-10-CM

## 2020-03-24 DIAGNOSIS — Z1283 Encounter for screening for malignant neoplasm of skin: Secondary | ICD-10-CM

## 2020-03-24 DIAGNOSIS — L821 Other seborrheic keratosis: Secondary | ICD-10-CM

## 2020-04-11 ENCOUNTER — Encounter: Payer: Self-pay | Admitting: Physician Assistant

## 2020-04-11 NOTE — Progress Notes (Signed)
   Follow-Up Visit   Subjective  [name] is a 65 y.o. female who presents for the following: Annual Exam (Patient here today for yearly skin check. Per patient she has a place on her right cheek x unsure per patient it feels like a horn per patient she used Tolak on her face that didn't do much. ).   The following portions of the chart were reviewed this encounter and updated as appropriate:  Tobacco  Allergies  Meds  Problems  Med Hx  Surg Hx  Fam Hx      Objective  Well appearing patient in no apparent distress; mood and affect are within normal limits.  A full examination was performed including scalp, head, eyes, ears, nose, lips, neck, chest, axillae, abdomen, back, buttocks, bilateral upper extremities, bilateral lower extremities, hands, feet, fingers, toes, fingernails, and toenails. All findings within normal limits unless otherwise noted below.  Objective  head to toe: No atypical nevi No signs of non-mole skin cancer.   Objective  Mid Left Back: White scar- clear  Objective  Right Lower Back: Multiple white scars- clear  Objective  Right Breast: Multiple red raised papule  Objective  Right Upper Back: Multiple Stuck-on, waxy, tan-brown papules and plaques. --Discussed benign etiology and prognosis.   Objective  Right Malar Cheek: Erythematous patches with gritty scale.   Assessment & Plan  Encounter for screening for malignant neoplasm of skin head to toe  Yearly skin check  History of squamous cell carcinoma of skin Mid Left Back  Yearly skin check  History of dysplastic nevus Right Lower Back  Yearly skin check  Hemangioma, unspecified site Right Breast  Yearly skin check  Seborrheic keratosis Right Upper Back  okay to leave if stable  AK (actinic keratosis) Right Malar Cheek  Tolak cream- will use again this winter  Destruction of lesion - Right Malar Cheek Complexity: simple   Destruction method: cryotherapy    Informed consent: discussed and consent obtained   Timeout:  patient name, date of birth, surgical site, and procedure verified Lesion destroyed using liquid nitrogen: Yes   Cryotherapy cycles:  3 Outcome: patient tolerated procedure well with no complications   Post-procedure details: wound care instructions given      I, Maricus Tanzi, PA-C, have reviewed all documentation's for this visit.  The documentation on 04/11/20 for the exam, diagnosis, procedures and orders are all accurate and complete.

## 2020-05-30 ENCOUNTER — Encounter: Payer: Self-pay | Admitting: Family Medicine

## 2020-05-30 ENCOUNTER — Other Ambulatory Visit: Payer: Self-pay

## 2020-05-30 ENCOUNTER — Ambulatory Visit: Payer: BC Managed Care – PPO | Admitting: Family Medicine

## 2020-05-30 VITALS — BP 112/73 | HR 78 | Temp 97.8°F | Resp 20 | Ht 65.0 in | Wt 142.0 lb

## 2020-05-30 DIAGNOSIS — N3001 Acute cystitis with hematuria: Secondary | ICD-10-CM

## 2020-05-30 LAB — URINALYSIS, COMPLETE
Bilirubin, UA: NEGATIVE
Glucose, UA: NEGATIVE
Nitrite, UA: NEGATIVE
Specific Gravity, UA: 1.02 (ref 1.005–1.030)
Urobilinogen, Ur: 1 mg/dL (ref 0.2–1.0)
pH, UA: 6 (ref 5.0–7.5)

## 2020-05-30 LAB — MICROSCOPIC EXAMINATION
Epithelial Cells (non renal): NONE SEEN /hpf (ref 0–10)
WBC, UA: >30 /hpf — AB (ref 0–5)

## 2020-05-30 MED ORDER — CEPHALEXIN 500 MG PO CAPS
500.0000 mg | ORAL_CAPSULE | Freq: Two times a day (BID) | ORAL | 0 refills | Status: DC
Start: 2020-05-30 — End: 2020-06-24

## 2020-05-30 NOTE — Progress Notes (Signed)
Acute Office Visit  Subjective:    Patient ID: [name], female    DOB: 1955/04/25, 65 y.o.   MRN: [id number]  Chief Complaint  Patient presents with  . Urinary Tract Infection    HPI Patient is in today for UTI symptoms. [name] reports malaise that started 4 days ago. She also reports urinary urgency and frequency, lower back pain, and intermittent nausea. She denies dysuria, vomiting, or fever. She did take some AZO over the weekend.   Past Medical History:  Diagnosis Date  . Atypical mole 06/24/1997   center back-moderate  . Atypical mole 06/18/2005   left arm,right upper back-slight  . Atypical mole 06/18/2005   lower mid back-moderate tx-widershave  . Atypical mole 06/17/2006   right lower abdomen-features of a deep nevus  . Atypical mole 06/25/2016   left thigh crease-mild  . Atypical mole 06/25/2016   mid chest-moderate  . Atypical mole 11/28/2017   left neck, right chest-moderate  . 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 Bipolar I disorder, most recent episode (or current) depressed   . Headache(784.0)   . Other and unspecified hyperlipidemia   . Other atopic dermatitis and related conditions   . Squamous cell carcinoma of skin 12/14/2016   mid left back  . 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 CESAREAN SECTION     x 2  . WISDOM TOOTH EXTRACTION  age 69    Family History  Problem Relation Age of Onset  . Heart disease Father   . Hypertension Father   . Thyroid disease Mother   . Osteoporosis Mother   . Colon cancer Maternal Grandmother 45  . Breast cancer Neg Hx     Social History   Socioeconomic History  . Marital status: Widowed    Spouse name: Not on file  . Number of children: 2  . Years of education: Not on file  . Highest education level: Not on file  Occupational History  . Occupation: Financial planner: Ambulance person  Tobacco  Use  . Smoking status: Never Smoker  . Smokeless tobacco: Never Used  Vaping Use  . Vaping Use: Never used  Substance and Sexual Activity  . Alcohol use: No  . Drug use: No  . Sexual activity: Not Currently    Partners: Male    Birth control/protection: Post-menopausal  Other Topics Concern  . Not on file  Social History Narrative   Patient is a retired Consulting civil engineer for [employer], retired 2019.   She has a granddaughter, whom she cares for during the week.   Social Determinants of Health   Financial Resource Strain: Not on file  Food Insecurity: Not on file  Transportation Needs: Not on file  Physical Activity: Not on file  Stress: Not on file  Social Connections: Not on file  Intimate Partner Violence: Not on file    Outpatient Medications Prior to Visit  Medication Sig Dispense Refill  . Calcium Citrate-Vitamin D (CALCIUM + D PO) Take by mouth.    . Ferrous Sulfate (IRON PO) Take by mouth.    Marland Kitchen ibuprofen (IBU) 800 MG tablet Take 1 tablet (800 mg total) by mouth every 8 (eight) hours as needed. 90 tablet 3  . lithium carbonate 300 MG capsule TAKE ONE CAPSULE BY MOUTH THREE TIMES DAILY WITH MEALS 270 capsule 1  . omeprazole (PRILOSEC) 20  MG capsule Take 1 capsule (20 mg total) by mouth daily. 90 capsule 3  . valACYclovir (VALTREX) 1000 MG tablet Take 1 tablet (1,000 mg total) by mouth daily. 60 tablet 6  . VITAMIN D PO Take by mouth.    . halobetasol (ULTRAVATE) 0.05 % cream  (Patient not taking: Reported on 05/30/2020)    . estradiol (ESTRACE) 0.1 MG/GM vaginal cream 1 gram vaginally every night for one week, then twice weekly at hs 42.5 g 0   No facility-administered medications prior to visit.    Allergies  Allergen Reactions  . Penicillins Rash    Review of Systems As per HPI.     Objective:    Physical Exam Vitals and nursing note reviewed.  Constitutional:      General: She is not in acute distress.    Appearance: She is not ill-appearing,  toxic-appearing or diaphoretic.  Cardiovascular:     Rate and Rhythm: Normal rate and regular rhythm.     Heart sounds: Normal heart sounds. No murmur heard.   Pulmonary:     Effort: Pulmonary effort is normal. No respiratory distress.     Breath sounds: Normal breath sounds.  Abdominal:     General: Bowel sounds are normal. There is no distension.     Palpations: Abdomen is soft.     Tenderness: There is no abdominal tenderness. There is no right CVA tenderness, left CVA tenderness, guarding or rebound.  Skin:    General: Skin is warm and dry.  Neurological:     General: No focal deficit present.     Mental Status: She is alert and oriented to person, place, and time.  Psychiatric:        Mood and Affect: Mood normal.        Behavior: Behavior normal.     BP 112/73   Pulse 78   Temp 97.8 F (36.6 C)   Resp 20   Ht 5\' 5"  (1.651 m)   Wt 142 lb (64.4 kg)   LMP 03/16/2010 (Approximate)   SpO2 100%   BMI 23.63 kg/m  Wt Readings from Last 3 Encounters:  05/30/20 142 lb (64.4 kg)  02/17/20 140 lb (63.5 kg)  01/12/20 140 lb 4.8 oz (63.6 kg)   Urine dipstick shows positive for RBC's, positive for leukocytes, positive for urobilinogen and positive for ketones.  Micro exam: >30 WBC's per HPF, 3-10 RBC's per HPF and few+ bacteria.   Health Maintenance Due  Topic Date Due  . Hepatitis C Screening  Never done  . COVID-19 Vaccine (3 - Pfizer risk 4-dose series) 11/13/2019    There are no preventive care reminders to display for this patient.   Lab Results  Component Value Date   TSH 1.790 01/06/2020   Lab Results  Component Value Date   WBC 6.5 01/06/2019   HGB 14.2 01/06/2019   HCT 42.3 01/06/2019   MCV 88 01/06/2019   PLT 274 01/06/2019   Lab Results  Component Value Date   NA 142 01/06/2020   K 4.3 01/06/2020   CO2 22 01/06/2020   GLUCOSE 91 01/06/2020   BUN 9 01/06/2020   CREATININE 0.81 01/06/2020   BILITOT 0.6 01/06/2019   ALKPHOS 73 01/06/2019   AST  12 01/06/2019   ALT 12 01/06/2019   PROT 6.8 01/06/2019   ALBUMIN 4.3 01/06/2020   CALCIUM 10.3 01/06/2020   Lab Results  Component Value Date   CHOL 200 (H) 01/06/2020   Lab Results  Component Value  Date   HDL 51 01/06/2020   Lab Results  Component Value Date   LDLCALC 120 (H) 01/06/2020   Lab Results  Component Value Date   TRIG 165 (H) 01/06/2020   Lab Results  Component Value Date   CHOLHDL 3.9 01/06/2020   No results found for: HGBA1C     Assessment & Plan:   Railey was seen today for urinary tract infection.  Diagnoses and all orders for this visit:  Acute cystitis with hematuria Keflex as below, patient has tolerated Keflex in the past. Culture pending. Increase hydration. Return to office for new or worsening symptoms, or if symptoms persist.  -     Urinalysis, Complete -     cephALEXin (KEFLEX) 500 MG capsule; Take 1 capsule (500 mg total) by mouth 2 (two) times daily. -     Urine Culture  The patient indicates understanding of these issues and agrees with the plan.   Gwenlyn Perking, FNP

## 2020-05-30 NOTE — Patient Instructions (Signed)
Urinary Tract Infection, Adult  A urinary tract infection (UTI) is an infection of any part of the urinary tract. The urinary tract includes the kidneys, ureters, bladder, and urethra. These organs make, store, and get rid of urine in the body. An upper UTI affects the ureters and kidneys. A lower UTI affects the bladder and urethra. What are the causes? Most urinary tract infections are caused by bacteria in your genital area around your urethra, where urine leaves your body. These bacteria grow and cause inflammation of your urinary tract. What increases the risk? You are more likely to develop this condition if:  You have a urinary catheter that stays in place.  You are not able to control when you urinate or have a bowel movement (incontinence).  You are female and you: ? Use a spermicide or diaphragm for birth control. ? Have low estrogen levels. ? Are pregnant.  You have certain genes that increase your risk.  You are sexually active.  You take antibiotic medicines.  You have a condition that causes your flow of urine to slow down, such as: ? An enlarged prostate, if you are female. ? Blockage in your urethra. ? A kidney stone. ? A nerve condition that affects your bladder control (neurogenic bladder). ? Not getting enough to drink, or not urinating often.  You have certain medical conditions, such as: ? Diabetes. ? A weak disease-fighting system (immunesystem). ? Sickle cell disease. ? Gout. ? Spinal cord injury. What are the signs or symptoms? Symptoms of this condition include:  Needing to urinate right away (urgency).  Frequent urination. This may include small amounts of urine each time you urinate.  Pain or burning with urination.  Blood in the urine.  Urine that smells bad or unusual.  Trouble urinating.  Cloudy urine.  Vaginal discharge, if you are female.  Pain in the abdomen or the lower back. You may also have:  Vomiting or a decreased  appetite.  Confusion.  Irritability or tiredness.  A fever or chills.  Diarrhea. The first symptom in older adults may be confusion. In some cases, they may not have any symptoms until the infection has worsened. How is this diagnosed? This condition is diagnosed based on your medical history and a physical exam. You may also have other tests, including:  Urine tests.  Blood tests.  Tests for STIs (sexually transmitted infections). If you have had more than one UTI, a cystoscopy or imaging studies may be done to determine the cause of the infections. How is this treated? Treatment for this condition includes:  Antibiotic medicine.  Over-the-counter medicines to treat discomfort.  Drinking enough water to stay hydrated. If you have frequent infections or have other conditions such as a kidney stone, you may need to see a health care provider who specializes in the urinary tract (urologist). In rare cases, urinary tract infections can cause sepsis. Sepsis is a life-threatening condition that occurs when the body responds to an infection. Sepsis is treated in the hospital with IV antibiotics, fluids, and other medicines. Follow these instructions at home: Medicines  Take over-the-counter and prescription medicines only as told by your health care provider.  If you were prescribed an antibiotic medicine, take it as told by your health care provider. Do not stop using the antibiotic even if you start to feel better. General instructions  Make sure you: ? Empty your bladder often and completely. Do not hold urine for long periods of time. ? Empty your bladder after   sex. ? Wipe from front to back after urinating or having a bowel movement if you are female. Use each tissue only one time when you wipe.  Drink enough fluid to keep your urine pale yellow.  Keep all follow-up visits. This is important.   Contact a health care provider if:  Your symptoms do not get better after 1-2  days.  Your symptoms go away and then return. Get help right away if:  You have severe pain in your back or your lower abdomen.  You have a fever or chills.  You have nausea or vomiting. Summary  A urinary tract infection (UTI) is an infection of any part of the urinary tract, which includes the kidneys, ureters, bladder, and urethra.  Most urinary tract infections are caused by bacteria in your genital area.  Treatment for this condition often includes antibiotic medicines.  If you were prescribed an antibiotic medicine, take it as told by your health care provider. Do not stop using the antibiotic even if you start to feel better.  Keep all follow-up visits. This is important. This information is not intended to replace advice given to you by your health care provider. Make sure you discuss any questions you have with your health care provider. Document Revised: 08/14/2019 Document Reviewed: 08/14/2019 Elsevier Patient Education  2021 Elsevier Inc.  

## 2020-06-01 ENCOUNTER — Other Ambulatory Visit: Payer: Self-pay | Admitting: Family Medicine

## 2020-06-01 ENCOUNTER — Telehealth: Payer: Self-pay

## 2020-06-01 DIAGNOSIS — B370 Candidal stomatitis: Secondary | ICD-10-CM

## 2020-06-01 MED ORDER — NYSTATIN 100000 UNIT/ML MT SUSP: 5.0000 mL | Freq: Four times a day (QID) | OROMUCOSAL | 0 refills | Status: DC

## 2020-06-01 NOTE — Telephone Encounter (Signed)
Oral nystatin sent in

## 2020-06-01 NOTE — Telephone Encounter (Signed)
Pt made aware

## 2020-06-01 NOTE — Telephone Encounter (Signed)
Pt is taking abx for uti--05/30/2020 and now has thrush. Can something be called in to [facility].

## 2020-06-20 ENCOUNTER — Ambulatory Visit: Payer: BC Managed Care – PPO | Admitting: Nurse Practitioner

## 2020-06-20 ENCOUNTER — Encounter: Payer: Self-pay | Admitting: Nurse Practitioner

## 2020-06-20 VITALS — Temp 101.7°F

## 2020-06-20 DIAGNOSIS — R399 Unspecified symptoms and signs involving the genitourinary system: Secondary | ICD-10-CM | POA: Diagnosis not present

## 2020-06-20 DIAGNOSIS — R059 Cough, unspecified: Secondary | ICD-10-CM | POA: Diagnosis not present

## 2020-06-20 LAB — URINALYSIS, MICROSCOPIC ONLY

## 2020-06-20 MED ORDER — BENZONATATE 100 MG PO CAPS: 100.0000 mg | ORAL_CAPSULE | Freq: Three times a day (TID) | ORAL | 0 refills | Status: DC | PRN

## 2020-06-20 MED ORDER — NITROFURANTOIN MONOHYD MACRO 100 MG PO CAPS: 100.0000 mg | ORAL_CAPSULE | Freq: Two times a day (BID) | ORAL | 0 refills | Status: DC

## 2020-06-20 NOTE — Progress Notes (Signed)
Virtual Visit  Note Due to COVID-19 pandemic this visit was conducted virtually. This visit type was conducted due to national recommendations for restrictions regarding the COVID-19 Pandemic (e.g. social distancing, sheltering in place) in an effort to limit this patient's exposure and mitigate transmission in our community. All issues noted in this document were discussed and addressed.  A physical exam was not performed with this format.  I connected with [name] on 06/20/20 at  8:25 AM by telephone and verified that I am speaking with the correct person using two identifiers. [name] is currently located at home during visit. The provider, Ivy Lynn, NP is located in their office at time of visit.  I discussed the limitations, risks, security and privacy concerns of performing an evaluation and management service by telephone and the availability of in person appointments. I also discussed with the patient that there may be a patient responsible charge related to this service. The patient expressed understanding and agreed to proceed.   History and Present Illness:  Cough This is a new problem. Episode onset: In the past 3 days. The problem has been gradually worsening. The problem occurs constantly. The cough is non-productive. Associated symptoms include chills and a fever. Pertinent negatives include no ear congestion, nasal congestion, rash, sore throat or shortness of breath.  Urinary Tract Infection  This is a new problem. Episode onset: In the past 4 days. The problem occurs intermittently. The problem has been unchanged. The maximum temperature recorded prior to her arrival was 101 - 101.9 F. Associated symptoms include chills and frequency. Pertinent negatives include no flank pain, nausea or vomiting. She has tried nothing for the symptoms.      Review of Systems  Constitutional: Positive for chills and fever.  HENT: Negative for sore throat.   Respiratory:  Positive for cough. Negative for shortness of breath.   Gastrointestinal: Negative for nausea and vomiting.  Genitourinary: Positive for frequency. Negative for flank pain.  Skin: Negative for rash.     Observations/Objective: Televisit patient did not sound to be in distress.  Assessment and Plan: UTI symptoms Worsening UTI symptoms in the last 3 days.  Patient reporting fever of 99.6-101.7 Tylenol has helped with symptom management.  Patient is reporting chills, burning with urination and frequency.  She denies nausea vomiting, pelvic pain and CVA tenderness.  Advised patient to come by clinic to drop urine sample.  And will treat appropriately.  Cough Worsening cough in the last 3 days.  No headache or sinus pressure.  Encourage patient to do a COVID-19 test.  Patient says she did not think she has COVID-19.  Advised patient to take medication as prescribed.  Started besilate 100 mg tablet by mouth 3 times daily.  Follow-up with worsening or unresolved symptoms.   Follow Up Instructions: Follow-up with unresolved symptoms.    I discussed the assessment and treatment plan with the patient. The patient was provided an opportunity to ask questions and all were answered. The patient agreed with the plan and demonstrated an understanding of the instructions.   The patient was advised to call back or seek an in-person evaluation if the symptoms worsen or if the condition fails to improve as anticipated.  The above assessment and management plan was discussed with the patient. The patient verbalized understanding of and has agreed to the management plan. Patient is aware to call the clinic if symptoms persist or worsen. Patient is aware when to return to the  clinic for a follow-up visit. Patient educated on when it is appropriate to go to the emergency department.   Time call ended: 8:36 AM  I provided 11 minutes of  non face-to-face time during this encounter.    Ivy Lynn,  NP

## 2020-06-20 NOTE — Addendum Note (Signed)
Addended by: Ivy Lynn on: 06/20/2020 11:01 AM   Modules accepted: Orders

## 2020-06-20 NOTE — Assessment & Plan Note (Signed)
Worsening UTI symptoms in the last 3 days.  Patient reporting fever of 99.6-101.7 Tylenol has helped with symptom management.  Patient is reporting chills, burning with urination and frequency.  She denies nausea vomiting, pelvic pain and CVA tenderness.  Advised patient to come by clinic to drop urine sample.  And will treat appropriately.

## 2020-06-20 NOTE — Assessment & Plan Note (Signed)
Worsening cough in the last 3 days.  No headache or sinus pressure.  Encourage patient to do a COVID-19 test.  Patient says she did not think she has COVID-19.  Advised patient to take medication as prescribed.  Started besilate 100 mg tablet by mouth 3 times daily.  Follow-up with worsening or unresolved symptoms.

## 2020-06-22 ENCOUNTER — Telehealth: Payer: Self-pay | Admitting: Family Medicine

## 2020-06-22 NOTE — Telephone Encounter (Signed)
NA

## 2020-06-24 ENCOUNTER — Other Ambulatory Visit: Payer: Self-pay | Admitting: Nurse Practitioner

## 2020-06-24 ENCOUNTER — Telehealth: Payer: Self-pay | Admitting: Family Medicine

## 2020-06-24 DIAGNOSIS — R399 Unspecified symptoms and signs involving the genitourinary system: Secondary | ICD-10-CM

## 2020-06-24 LAB — URINE CULTURE

## 2020-06-24 MED ORDER — SULFAMETHOXAZOLE-TRIMETHOPRIM 800-160 MG PO TABS: 1.0000 | ORAL_TABLET | Freq: Two times a day (BID) | ORAL | 0 refills | Status: DC

## 2020-06-24 NOTE — Telephone Encounter (Signed)
Urine culture positive for UTI, I sent antibiotic to pharmacy, advice patient to follow up with unresolved symptoms

## 2020-06-24 NOTE — Telephone Encounter (Signed)
Patient aware and verbalized understanding. °

## 2020-07-05 ENCOUNTER — Encounter: Payer: Self-pay | Admitting: Family Medicine

## 2020-07-05 ENCOUNTER — Other Ambulatory Visit: Payer: Self-pay

## 2020-07-05 ENCOUNTER — Other Ambulatory Visit (HOSPITAL_COMMUNITY)
Admission: RE | Admit: 2020-07-05 | Discharge: 2020-07-05 | Disposition: A | Discharge status: Home / Self Care | Payer: BC Managed Care – PPO | Source: Ambulatory Visit | Attending: Family Medicine | Admitting: Family Medicine

## 2020-07-05 ENCOUNTER — Ambulatory Visit: Payer: BC Managed Care – PPO | Admitting: Family Medicine

## 2020-07-05 ENCOUNTER — Other Ambulatory Visit: Payer: Self-pay | Admitting: Obstetrics and Gynecology

## 2020-07-05 VITALS — BP 121/70 | HR 61 | Temp 97.8°F | Resp 20 | Ht 65.0 in | Wt 133.2 lb

## 2020-07-05 DIAGNOSIS — Z79899 Other long term (current) drug therapy: Secondary | ICD-10-CM

## 2020-07-05 DIAGNOSIS — N898 Other specified noninflammatory disorders of vagina: Secondary | ICD-10-CM | POA: Diagnosis not present

## 2020-07-05 DIAGNOSIS — Z5181 Encounter for therapeutic drug level monitoring: Secondary | ICD-10-CM

## 2020-07-05 DIAGNOSIS — Z87440 Personal history of urinary (tract) infections: Secondary | ICD-10-CM | POA: Diagnosis not present

## 2020-07-05 DIAGNOSIS — Z8619 Personal history of other infectious and parasitic diseases: Secondary | ICD-10-CM

## 2020-07-05 DIAGNOSIS — F3130 Bipolar disorder, current episode depressed, mild or moderate severity, unspecified: Secondary | ICD-10-CM

## 2020-07-05 DIAGNOSIS — K2100 Gastro-esophageal reflux disease with esophagitis, without bleeding: Secondary | ICD-10-CM

## 2020-07-05 LAB — MICROSCOPIC EXAMINATION

## 2020-07-05 LAB — URINALYSIS, COMPLETE
Bilirubin, UA: NEGATIVE
Glucose, UA: NEGATIVE
Ketones, UA: NEGATIVE
Nitrite, UA: NEGATIVE
Protein,UA: NEGATIVE
RBC, UA: NEGATIVE
Specific Gravity, UA: 1.015 (ref 1.005–1.030)
Urobilinogen, Ur: 0.2 mg/dL (ref 0.2–1.0)
pH, UA: 8 — ABNORMAL HIGH (ref 5.0–7.5)

## 2020-07-05 LAB — WET PREP FOR TRICH, YEAST, CLUE
Clue Cell Exam: NEGATIVE
Trichomonas Exam: NEGATIVE

## 2020-07-05 MED ORDER — IBUPROFEN 800 MG PO TABS: 800.0000 mg | ORAL_TABLET | Freq: Three times a day (TID) | ORAL | 3 refills | Status: DC | PRN

## 2020-07-05 MED ORDER — LITHIUM CARBONATE 300 MG PO CAPS: ORAL_CAPSULE | ORAL | 1 refills | Status: DC

## 2020-07-05 MED ORDER — VALACYCLOVIR HCL 1 G PO TABS: 1000.0000 mg | ORAL_TABLET | Freq: Every day | ORAL | 6 refills | Status: DC

## 2020-07-05 MED ORDER — VALACYCLOVIR HCL 500 MG PO TABS: ORAL_TABLET | ORAL | 1 refills | Status: DC

## 2020-07-05 MED ORDER — OMEPRAZOLE 20 MG PO CPDR: 20.0000 mg | DELAYED_RELEASE_CAPSULE | Freq: Every day | ORAL | 3 refills | Status: DC

## 2020-07-05 MED ORDER — FLUCONAZOLE 150 MG PO TABS: 150.0000 mg | ORAL_TABLET | Freq: Once | ORAL | 0 refills | Status: AC

## 2020-07-05 NOTE — Progress Notes (Signed)
Subjective: CC: f/u HLD, BP disorder, vaginal itching PCP: Janora Norlander, DO [name] is a 65 y.o. female presenting to clinic today for:  1. Vaginal itching/ recent UTI Patient reports several days of vaginal itching and irritation.  She denies any vaginal odors, vaginal discharge.  She recently became sexually active with a new partner.  Does not use protection.  Saw her OB/GYN in December and in February.  She denies any postcoital bleeding, pelvic pain.  Recently treated with antibiotics x2 in the last month and a half for urinary tract infections.  No dysuria, hematuria or change in urinary frequency.  Has been using some AZO.  2.  Bipolar disorder Compliant with her medications.  Needs refills.  Denies any instability.   ROS: Per HPI  Allergies  Allergen Reactions   Penicillins Rash   Past Medical History:  Diagnosis Date   Atypical mole 06/24/1997   center back-moderate   Atypical mole 06/18/2005   left arm,right upper back-slight   Atypical mole 06/18/2005   lower mid back-moderate tx-widershave   Atypical mole 06/17/2006   right lower abdomen-features of a deep nevus   Atypical mole 06/25/2016   left thigh crease-mild   Atypical mole 06/25/2016   mid chest-moderate   Atypical mole 11/28/2017   left neck, right chest-moderate   Benign neoplasm of colon 04/2008   tubular adenoma   Bipolar I disorder, most recent episode (or current) depressed    Headache(784.0)    Other and unspecified hyperlipidemia    Other atopic dermatitis and related conditions    Squamous cell carcinoma of skin 12/14/2016   mid left back   STD (sexually transmitted disease) 1989   HSV    Current Outpatient Medications:    Calcium Citrate-Vitamin D (CALCIUM + D PO), Take by mouth., Disp: , Rfl:    Ferrous Sulfate (IRON PO), Take by mouth., Disp: , Rfl:    ibuprofen (IBU) 800 MG tablet, Take 1 tablet (800 mg total) by mouth every 8 (eight) hours as needed., Disp: 90  tablet, Rfl: 3   lithium carbonate 300 MG capsule, TAKE ONE CAPSULE BY MOUTH THREE TIMES DAILY WITH MEALS, Disp: 270 capsule, Rfl: 1   omeprazole (PRILOSEC) 20 MG capsule, Take 1 capsule (20 mg total) by mouth daily., Disp: 90 capsule, Rfl: 3   valACYclovir (VALTREX) 1000 MG tablet, Take 1 tablet (1,000 mg total) by mouth daily., Disp: 60 tablet, Rfl: 6   VITAMIN D PO, Take by mouth., Disp: , Rfl:    halobetasol (ULTRAVATE) 0.05 % cream, , Disp: , Rfl:  Social History   Socioeconomic History   Marital status: Widowed    Spouse name: Not on file   Number of children: 2   Years of education: Not on file   Highest education level: Not on file  Occupational History   Occupation: TEACHER ASSISTANT    Employer: [employer]  Tobacco Use   Smoking status: Never   Smokeless tobacco: Never  Vaping Use   Vaping Use: Never used  Substance and Sexual Activity   Alcohol use: No   Drug use: No   Sexual activity: Not Currently    Partners: Male    Birth control/protection: Post-menopausal  Other Topics Concern   Not on file  Social History Narrative   Patient is a retired Consulting civil engineer for [employer], retired 2019.   She has a granddaughter, whom she cares for during the week.   Social Determinants of Health  Financial Resource Strain: Not on file  Food Insecurity: Not on file  Transportation Needs: Not on file  Physical Activity: Not on file  Stress: Not on file  Social Connections: Not on file  Intimate Partner Violence: Not on file   Family History  Problem Relation Age of Onset   Heart disease Father    Hypertension Father    Thyroid disease Mother    Osteoporosis Mother    Colon cancer Maternal Grandmother 60   Breast cancer Neg Hx     Objective: Office vital signs reviewed. BP 121/70   Pulse 61   Temp 97.8 F (36.6 C) (Temporal)   Resp 20   Ht 5' 5"  (1.651 m)   Wt 133 lb 4 oz (60.4 kg)   LMP 03/16/2010 (Approximate)   SpO2 100%   BMI 22.17  kg/m   Physical Examination:  General: Awake, alert, well nourished, No acute distress HEENT: Normal; sclera white, MMM Cardio: regular rate and rhythm, S1S2 heard, no murmurs appreciated Pulm: clear to auscultation bilaterally, no wheezes, rhonchi or rales; normal work of breathing on room air GI: soft, non-tender, non-distended, bowel sounds present x4, no hepatomegaly, no splenomegaly, no masses GU: external vaginal tissue atrophic, cervix retroverted, no punctate lesions on cervix appreciated, scant, creamy white discharge noted within the vaginal vault.  No bleeding Psych: Mood stable, speech normal, affect appropriate.  Patient is pleasant and interactive.  Thought process linear.  Does not appear to be responding to internal stimuli.  Depression screen [facility] 2/9 07/05/2020 05/30/2020 01/06/2020  Decreased Interest 0 0 0  Down, Depressed, Hopeless 0 0 0  PHQ - 2 Score 0 0 0  Altered sleeping 0 - -  Tired, decreased energy 0 - -  Change in appetite 0 - -  Feeling bad or failure about yourself  0 - -  Trouble concentrating 0 - -  Moving slowly or fidgety/restless 0 - -  Suicidal thoughts 0 - -  PHQ-9 Score 0 - -  Difficult doing work/chores - - -   No flowsheet data found.  Assessment/ Plan: 65 y.o. female   Bipolar I disorder, most recent episode (or current) depressed ([place]) - Plan: CMP14+EGFR, lithium carbonate 300 MG capsule  Encounter for lithium monitoring - Plan: CMP14+EGFR, PTH, Intact and Calcium, Lithium level, TSH  Vaginal itching - Plan: WET PREP FOR TRICH, YEAST, CLUE, Cervicovaginal ancillary only, fluconazole (DIFLUCAN) 150 MG tablet  Recent urinary tract infection - Plan: Urinalysis, Complete, Urine Culture, WET PREP FOR TRICH, YEAST, CLUE  Gastroesophageal reflux disease with esophagitis without hemorrhage - Plan: omeprazole (PRILOSEC) 20 MG capsule  Clinically stable from a bipolar disorder standpoint.  Check lithium level, CMP, TSH, PTH  Vaginal itching  clinically consistent with candidal vaginitis.  Diflucan sent.  We discussed potential for drug interaction with lithium but I think this to be unlikely given her normal QTC noted on last EKG.  We discussed that if symptoms resolve with 1 tablet no need to repeat but she may repeat in 3 days if symptoms are persistent.  Vaginal health discussed.  Encouraged probiotic.  GC and Chlamydia were obtained for completion given new sexual partner but clinically did not appear to have an STD today.  Urinalysis and urine culture were obtained in the setting of recent urinary tract infections but again clinically not consistent with a urinary tract infection.  Needed refills on omeprazole and this was sent.  She may follow-up in 6 months for annual physical exam with fasting labs.  We will plan to repeat EKG at that visit as well.  Orders Placed This Encounter  Procedures   Urine Culture   WET PREP FOR TRICH, YEAST, CLUE   Urinalysis, Complete   CMP14+EGFR   PTH, Intact and Calcium   Lithium level   TSH   Meds ordered this encounter  Medications   lithium carbonate 300 MG capsule    Sig: TAKE ONE CAPSULE BY MOUTH THREE TIMES DAILY WITH MEALS    Dispense:  270 capsule    Refill:  1   ibuprofen (IBU) 800 MG tablet    Sig: Take 1 tablet (800 mg total) by mouth every 8 (eight) hours as needed.    Dispense:  90 tablet    Refill:  3   omeprazole (PRILOSEC) 20 MG capsule    Sig: Take 1 capsule (20 mg total) by mouth daily.    Dispense:  90 capsule    Refill:  3   fluconazole (DIFLUCAN) 150 MG tablet    Sig: Take 1 tablet (150 mg total) by mouth once for 1 dose.    Dispense:  2 tablet    Refill:  [place], DO [place] [phone]

## 2020-07-05 NOTE — Telephone Encounter (Signed)
Chart and labs reviewed (normal renal function in 12/21). Please check how the patient is taking the Valtrex. I believe she is taking it just as needed, not daily as written. If she is taking it as needed I would change her to the standard dose which is 500 mg (not 1,000), BID x 3 days as needed. #30, 2 refills.  If she is having frequent outbreaks then she can take it daily if desired. That would be 500 mg daily, increase to BID x 3 days as needed (#90, 1 refill).  Thanks

## 2020-07-05 NOTE — Telephone Encounter (Signed)
Received refill request from [facility] for the following:  Medication refill request: Valtrex 1gm #60,[id number] Last AEX:  12-31-19 Next AEX: 01-19-21 Last MMG (if hormonal medication request): Refill authorized: 01-05-20 [id number]  Please refill if appropriate Routed to provider

## 2020-07-05 NOTE — Telephone Encounter (Signed)
Annual exam due in Dec. 2022  Recent mammogram  12/2019

## 2020-07-05 NOTE — Patient Instructions (Signed)
You had labs performed today.  You will be contacted with the results of the labs once they are available, usually in the next 3 business days for routine lab work.  If you have an active my chart account, they will be released to your MyChart.  If you prefer to have these labs released to you via telephone, please let us know.  If you had a pap smear or biopsy performed, expect to be contacted in about 7-10 days. Vaginal Yeast Infection, Adult  Vaginal yeast infection is a condition that causes vaginal discharge as well as soreness, swelling, and redness (inflammation) of the vagina. This is a common condition. Some women get this infectionfrequently. What are the causes? This condition is caused by a change in the normal balance of the yeast (candida) and bacteria that live in the vagina. This change causes an overgrowth ofyeast, which causes the inflammation. What increases the risk? The condition is more likely to develop in women who: Take antibiotic medicines. Have diabetes. Take birth control pills. Are pregnant. Douche often. Have a weak body defense system (immune system). Have been taking steroid medicines for a long time. Frequently wear tight clothing. What are the signs or symptoms? Symptoms of this condition include: White, thick, creamy vaginal discharge. Swelling, itching, redness, and irritation of the vagina. The lips of the vagina (vulva) may be affected as well. Pain or a burning feeling while urinating. Pain during sex. How is this diagnosed? This condition is diagnosed based on: Your medical history. A physical exam. A pelvic exam. Your health care provider will examine a sample of your vaginal discharge under a microscope. Your health care provider may send this sample for testing to confirm the diagnosis. How is this treated? This condition is treated with medicine. Medicines may be over-the-counter or prescription. You may be told to use one or more of the  following: Medicine that is taken by mouth (orally). Medicine that is applied as a cream (topically). Medicine that is inserted directly into the vagina (suppository). Follow these instructions at home:  Lifestyle Do not have sex until your health care provider approves. Tell your sex partner that you have a yeast infection. That person should go to his or her health care provider and ask if they should also be treated. Do not wear tight clothes, such as pantyhose or tight pants. Wear breathable cotton underwear. General instructions Take or apply over-the-counter and prescription medicines only as told by your health care provider. Eat more yogurt. This may help to keep your yeast infection from returning. Do not use tampons until your health care provider approves. Try taking a sitz bath to help with discomfort. This is a warm water bath that is taken while you are sitting down. The water should only come up to your hips and should cover your buttocks. Do this 3-4 times per day or as told by your health care provider. Do not douche. If you have diabetes, keep your blood sugar levels under control. Keep all follow-up visits as told by your health care provider. This is important. Contact a health care provider if: You have a fever. Your symptoms go away and then return. Your symptoms do not get better with treatment. Your symptoms get worse. You have new symptoms. You develop blisters in or around your vagina. You have blood coming from your vagina and it is not your menstrual period. You develop pain in your abdomen. Summary Vaginal yeast infection is a condition that causes discharge as  well as soreness, swelling, and redness (inflammation) of the vagina. This condition is treated with medicine. Medicines may be over-the-counter or prescription. Take or apply over-the-counter and prescription medicines only as told by your health care provider. Do not douche. Do not have sex or use  tampons until your health care provider approves. Contact a health care provider if your symptoms do not get better with treatment or your symptoms go away and then return. This information is not intended to replace advice given to you by your health care provider. Make sure you discuss any questions you have with your healthcare provider. Document Revised: 12/23/2019 Document Reviewed: 05/20/2017 Elsevier Patient Education  [place].

## 2020-07-05 NOTE — Telephone Encounter (Signed)
Patient informed with below note, Rx sent.

## 2020-07-06 LAB — TSH: TSH: 1.2 u[IU]/mL (ref 0.450–4.500)

## 2020-07-06 LAB — CERVICOVAGINAL ANCILLARY ONLY
Chlamydia: NEGATIVE
Comment: NEGATIVE
Comment: NEGATIVE
Comment: NORMAL
Neisseria Gonorrhea: NEGATIVE
Trichomonas: NEGATIVE

## 2020-07-06 LAB — CMP14+EGFR
ALT: 9 IU/L (ref 0–32)
AST: 9 IU/L (ref 0–40)
Albumin/Globulin Ratio: 1.7 (ref 1.2–2.2)
Albumin: 4.3 g/dL (ref 3.8–4.8)
Alkaline Phosphatase: 73 IU/L (ref 44–121)
BUN/Creatinine Ratio: 10 — ABNORMAL LOW (ref 12–28)
BUN: 9 mg/dL (ref 8–27)
Bilirubin Total: 0.6 mg/dL (ref 0.0–1.2)
CO2: 20 mmol/L (ref 20–29)
Calcium: 10.3 mg/dL (ref 8.7–10.3)
Chloride: 104 mmol/L (ref 96–106)
Creatinine, Ser: 0.88 mg/dL (ref 0.57–1.00)
Globulin, Total: 2.6 g/dL (ref 1.5–4.5)
Glucose: 82 mg/dL (ref 65–99)
Potassium: 4.3 mmol/L (ref 3.5–5.2)
Sodium: 141 mmol/L (ref 134–144)
Total Protein: 6.9 g/dL (ref 6.0–8.5)
eGFR: 74 mL/min/{1.73_m2} (ref >59)

## 2020-07-06 LAB — PTH, INTACT AND CALCIUM: PTH: 28 pg/mL (ref 15–65)

## 2020-07-06 LAB — LITHIUM LEVEL: Lithium Lvl: 1.2 mmol/L (ref 0.5–1.2)

## 2020-07-07 LAB — URINE CULTURE

## 2020-08-12 ENCOUNTER — Encounter: Payer: Self-pay | Admitting: *Deleted

## 2020-11-16 ENCOUNTER — Other Ambulatory Visit: Payer: Self-pay | Admitting: Obstetrics and Gynecology

## 2020-11-16 DIAGNOSIS — Z1231 Encounter for screening mammogram for malignant neoplasm of breast: Secondary | ICD-10-CM

## 2021-01-02 NOTE — Progress Notes (Signed)
66 y.o. G76P2012 Widowed White or Caucasian Not Hispanic or Latino female here for annual exam.  No vaginal bleeding.  Sexually active, same partner since 2/22. No dyspareunia. Negative cervical cultures in 6/22. No blood work.  She has a h/o genital HSV, on suppression, hasn't had an outbreak in years.   H/O LSIL/+HPV pap last year. She has a stenotic cervix, flush with her vagina. Unable to do an ECC at her colposcopy last year even after treating with estrogen and cytotec (see note 02/17/20).   Nocturia x 3, voiding normal amounts.   BM every 2-3 days, not really straining. Has been like this for many years.     Patient's last menstrual period was 03/16/2010 (approximate).          Sexually active: Yes.    The current method of family planning is post menopausal status.    Exercising: Yes.     Dancing 2x a week  Smoker:  no  Health Maintenance: Pap:12/31/19 LSIL Hr HPV + 12-29-2018 neg HPV HR+ 16, 18/45 neg History of abnormal Pap:  yes had colpo in 21, biopsies were negative. Unable to do an ECC, cervix was stenotic and flush with her vagina.   MMG:  01/12/20 Bi-rads 1 neg, has an appointment today.   BMD:   01/12/20 low bone mass  Colonoscopy:  2020 per patient (in [place]) polyps f/u 51yrs TDaP:  07/16/2014  Gardasil: n/a   reports that she has never smoked. She has never used smokeless tobacco. She reports that she does not drink alcohol and does not use drugs. She has ~3 drinks a week. 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   Past Medical History:  Diagnosis Date   Atypical mole 06/24/1997   center back-moderate   Atypical mole 06/18/2005   left arm,right upper back-slight   Atypical mole 06/18/2005   lower mid back-moderate tx-widershave   Atypical mole 06/17/2006   right lower abdomen-features of a deep nevus   Atypical mole 06/25/2016   left thigh crease-mild   Atypical mole 06/25/2016   mid chest-moderate   Atypical mole 11/28/2017   left neck, right  chest-moderate   Benign neoplasm of colon 04/2008   tubular adenoma   Bipolar I disorder, most recent episode (or current) depressed    Headache(784.0)    Other and unspecified hyperlipidemia    Other atopic dermatitis and related conditions    Squamous cell carcinoma of skin 12/14/2016   mid left back   STD (sexually transmitted disease) 1989   HSV    Past Surgical History:  Procedure Laterality Date   CESAREAN SECTION     x 2   WISDOM TOOTH EXTRACTION  age 78    Current Outpatient Medications  Medication Sig Dispense Refill   Calcium Citrate-Vitamin D (CALCIUM + D PO) Take by mouth.     Ferrous Sulfate (IRON PO) Take by mouth.     ibuprofen (IBU) 800 MG tablet Take 1 tablet (800 mg total) by mouth every 8 (eight) hours as needed. 90 tablet 3   lithium carbonate 300 MG capsule TAKE ONE CAPSULE BY MOUTH THREE TIMES DAILY WITH MEALS 270 capsule 1   omeprazole (PRILOSEC) 20 MG capsule Take 1 capsule (20 mg total) by mouth daily. 90 capsule 3   valACYclovir (VALTREX) 500 MG tablet Take one tablet by mouth twice daily for 3 days for a outbreak and then prn as needed. 90 tablet 1   VITAMIN D PO Take by mouth.  No current facility-administered medications for this visit.    Family History  Problem Relation Age of Onset   Heart disease Father    Hypertension Father    Thyroid disease Mother    Osteoporosis Mother    Colon cancer Maternal Grandmother 81   Breast cancer Neg Hx     Review of Systems  All other systems reviewed and are negative.  Exam:   BP 122/84    Pulse 72    Ht 5\' 5"  (1.651 m)    Wt 140 lb (63.5 kg)    LMP 03/16/2010 (Approximate)    SpO2 92%    BMI 23.30 kg/m   Weight change: @WEIGHTCHANGE @ Height:   Height: 5\' 5"  (165.1 cm)  Ht Readings from Last 3 Encounters:  01/04/21 5\' 5"  (1.651 m)  07/05/20 5\' 5"  (1.651 m)  05/30/20 5\' 5"  (1.651 m)    General appearance: alert, cooperative and appears stated age Head: Normocephalic, without obvious  abnormality, atraumatic Neck: no adenopathy, supple, symmetrical, trachea midline and thyroid normal to inspection and palpation Lungs: clear to auscultation bilaterally Cardiovascular: regular rate and rhythm Breasts: normal appearance, no masses or tenderness Abdomen: soft, non-tender; non distended,  no masses,  no organomegaly Extremities: extremities normal, atraumatic, no cyanosis or edema Skin: Skin color, texture, turgor normal. No rashes or lesions Lymph nodes: Cervical, supraclavicular, and axillary nodes normal. No abnormal inguinal nodes palpated Neurologic: Grossly normal   Pelvic: External genitalia:  no lesions              Urethra:  normal appearing urethra with no masses, tenderness or lesions              Bartholins and Skenes: normal                 Vagina: normal appearing vagina with normal color and discharge, no lesions              Cervix: no lesions and flush with vagina and stenotic, not able to identify her os.               Bimanual Exam:  Uterus:  normal size, contour, position, consistency, mobility, non-tender              Adnexa: no mass, fullness, tenderness               Rectovaginal: Confirms               Anus:  normal sphincter tone, no lesions  Gae Dry chaperoned for the exam.  1. Well woman exam Mammogram today DEXA and colonoscopy UTD Discussed breast self exam Discussed calcium and vit D intake Labs with primary  2. Screening for cervical cancer H/O LSIL pap last year - Cytology - PAP  3. Screening examination for STD (sexually transmitted disease) Negative cervical cultures with primary in 6/22 - RPR - HIV Antibody (routine testing w rflx)  4. Herpes simplex infection of genitourinary system On suppression, no outbreaks for years. - valACYclovir (VALTREX) 500 MG tablet; Take one tablet by mouth daily, increase to twice daily for 3 days for a outbreak  Dispense: 90 tablet; Refill: 4

## 2021-01-04 ENCOUNTER — Ambulatory Visit: Payer: BC Managed Care – PPO | Admitting: Family Medicine

## 2021-01-04 ENCOUNTER — Ambulatory Visit (INDEPENDENT_AMBULATORY_CARE_PROVIDER_SITE_OTHER): Payer: BC Managed Care – PPO | Admitting: Obstetrics and Gynecology

## 2021-01-04 ENCOUNTER — Ambulatory Visit
Admission: RE | Admit: 2021-01-04 | Discharge: 2021-01-04 | Disposition: A | Discharge status: Home / Self Care | Payer: BC Managed Care – PPO | Source: Ambulatory Visit | Attending: Obstetrics and Gynecology | Admitting: Obstetrics and Gynecology

## 2021-01-04 ENCOUNTER — Other Ambulatory Visit: Payer: Self-pay

## 2021-01-04 ENCOUNTER — Encounter: Payer: Self-pay | Admitting: Obstetrics and Gynecology

## 2021-01-04 ENCOUNTER — Other Ambulatory Visit (HOSPITAL_COMMUNITY)
Admission: RE | Admit: 2021-01-04 | Discharge: 2021-01-04 | Disposition: A | Discharge status: Home / Self Care | Payer: BC Managed Care – PPO | Source: Ambulatory Visit | Attending: Nurse Practitioner | Admitting: Nurse Practitioner

## 2021-01-04 VITALS — BP 122/84 | HR 72 | Ht 65.0 in | Wt 140.0 lb

## 2021-01-04 DIAGNOSIS — Z124 Encounter for screening for malignant neoplasm of cervix: Secondary | ICD-10-CM | POA: Insufficient documentation

## 2021-01-04 DIAGNOSIS — Z113 Encounter for screening for infections with a predominantly sexual mode of transmission: Secondary | ICD-10-CM

## 2021-01-04 DIAGNOSIS — A6000 Herpesviral infection of urogenital system, unspecified: Secondary | ICD-10-CM

## 2021-01-04 DIAGNOSIS — Z01419 Encounter for gynecological examination (general) (routine) without abnormal findings: Secondary | ICD-10-CM | POA: Diagnosis not present

## 2021-01-04 DIAGNOSIS — Z1231 Encounter for screening mammogram for malignant neoplasm of breast: Secondary | ICD-10-CM

## 2021-01-04 MED ORDER — VALACYCLOVIR HCL 500 MG PO TABS: ORAL_TABLET | ORAL | 4 refills | Status: DC

## 2021-01-04 NOTE — Patient Instructions (Signed)

## 2021-01-05 LAB — RPR: RPR Ser Ql: NONREACTIVE

## 2021-01-05 LAB — HIV ANTIBODY (ROUTINE TESTING W REFLEX): HIV 1&2 Ab, 4th Generation: NONREACTIVE

## 2021-01-10 ENCOUNTER — Encounter: Payer: Self-pay | Admitting: Family Medicine

## 2021-01-10 ENCOUNTER — Ambulatory Visit: Payer: BC Managed Care – PPO | Admitting: Family Medicine

## 2021-01-10 VITALS — BP 131/75 | HR 58 | Temp 97.5°F | Ht 65.0 in | Wt 142.8 lb

## 2021-01-10 DIAGNOSIS — Z713 Dietary counseling and surveillance: Secondary | ICD-10-CM | POA: Diagnosis not present

## 2021-01-10 DIAGNOSIS — Z5181 Encounter for therapeutic drug level monitoring: Secondary | ICD-10-CM | POA: Diagnosis not present

## 2021-01-10 DIAGNOSIS — F3130 Bipolar disorder, current episode depressed, mild or moderate severity, unspecified: Secondary | ICD-10-CM | POA: Diagnosis not present

## 2021-01-10 DIAGNOSIS — Z79899 Other long term (current) drug therapy: Secondary | ICD-10-CM

## 2021-01-10 LAB — URINALYSIS
Bilirubin, UA: NEGATIVE
Glucose, UA: NEGATIVE
Ketones, UA: NEGATIVE
Nitrite, UA: NEGATIVE
Protein,UA: NEGATIVE
RBC, UA: NEGATIVE
Specific Gravity, UA: 1.015 (ref 1.005–1.030)
Urobilinogen, Ur: 0.2 mg/dL (ref 0.2–1.0)
pH, UA: 7.5 (ref 5.0–7.5)

## 2021-01-10 MED ORDER — LITHIUM CARBONATE 300 MG PO CAPS: ORAL_CAPSULE | ORAL | 1 refills | Status: DC

## 2021-01-10 NOTE — Progress Notes (Signed)
Subjective: CC: Follow-up bipolar disorder PCP: Janora Norlander, DO RJJ:OACZYSA B [place]ree is a 65 y.o. female presenting to clinic today for:  1.  Bipolar disorder Patient is compliant with lithium 3 times daily.  She rarely forgets a pill but denies any change in mood.  Last dose was last evening.  Denies any mood instability, chest pain, shortness of breath, change in vision.  She does worry about her father, who seems to suffer from depression but refuses any antidepressants.  2.  Weight gain Patient reports mild weight gain over the holidays.  Asking for tips on how to reduce.  Currently drinking Slim fast shakes.  Notes that she is not able to exercise as much as she would like due to caring for her grandson.  Feels that she has some abdominal weight that she would like to get rid of.   ROS: Per HPI  Allergies  Allergen Reactions   Penicillins Rash   Past Medical History:  Diagnosis Date   Atypical mole 06/24/1997   center back-moderate   Atypical mole 06/18/2005   left arm,right upper back-slight   Atypical mole 06/18/2005   lower mid back-moderate tx-widershave   Atypical mole 06/17/2006   right lower abdomen-features of a deep nevus   Atypical mole 06/25/2016   left thigh crease-mild   Atypical mole 06/25/2016   mid chest-moderate   Atypical mole 11/28/2017   left neck, right chest-moderate   Benign neoplasm of colon 04/2008   tubular adenoma   Bipolar I disorder, most recent episode (or current) depressed    Headache(784.0)    Other and unspecified hyperlipidemia    Other atopic dermatitis and related conditions    Squamous cell carcinoma of skin 12/14/2016   mid left back   STD (sexually transmitted disease) 1989   HSV    Current Outpatient Medications:    Calcium Citrate-Vitamin D (CALCIUM + D PO), Take by mouth., Disp: , Rfl:    Ferrous Sulfate (IRON PO), Take by mouth., Disp: , Rfl:    ibuprofen (IBU) 800 MG tablet, Take 1 tablet (800 mg total) by  mouth every 8 (eight) hours as needed., Disp: 90 tablet, Rfl: 3   lithium carbonate 300 MG capsule, TAKE ONE CAPSULE BY MOUTH THREE TIMES DAILY WITH MEALS, Disp: 270 capsule, Rfl: 1   omeprazole (PRILOSEC) 20 MG capsule, Take 1 capsule (20 mg total) by mouth daily., Disp: 90 capsule, Rfl: 3   valACYclovir (VALTREX) 500 MG tablet, Take one tablet by mouth daily, increase to twice daily for 3 days for a outbreak, Disp: 90 tablet, Rfl: 4   VITAMIN D PO, Take by mouth., Disp: , Rfl:  Social History   Socioeconomic History   Marital status: Widowed    Spouse name: Not on file   Number of children: 2   Years of education: Not on file   Highest education level: Not on file  Occupational History   Occupation: TEACHER ASSISTANT    Employer: [employer]  Tobacco Use   Smoking status: Never   Smokeless tobacco: Never  Vaping Use   Vaping Use: Never used  Substance and Sexual Activity   Alcohol use: No   Drug use: No   Sexual activity: Not Currently    Partners: Male    Birth control/protection: Post-menopausal  Other Topics Concern   Not on file  Social History Narrative   Patient is a retired Consulting civil engineer for [employer], retired 2019.   She has a granddaughter,  whom she cares for during the week.   Social Determinants of Health   Financial Resource Strain: Not on file  Food Insecurity: Not on file  Transportation Needs: Not on file  Physical Activity: Not on file  Stress: Not on file  Social Connections: Not on file  Intimate Partner Violence: Not on file   Family History  Problem Relation Age of Onset   Heart disease Father    Hypertension Father    Thyroid disease Mother    Osteoporosis Mother    Colon cancer Maternal Grandmother 66   Breast cancer Neg Hx     Objective: Office vital signs reviewed. BP 131/75    Pulse (!) 58    Temp (!) 97.5 F (36.4 C)    Ht 5\' 5"  (1.651 m)    Wt 142 lb 12.8 oz (64.8 kg)    LMP 03/16/2010 (Approximate)    SpO2 98%     BMI 23.76 kg/m   Physical Examination:  General: Awake, alert, well nourished, No acute distress HEENT: Sclera white.  Moist mucous membranes Cardio: regular rate and rhythm, S1S2 heard, no murmurs appreciated Pulm: clear to auscultation bilaterally, no wheezes, rhonchi or rales; normal work of breathing on room air Extremities: warm, well perfused, No edema, cyanosis or clubbing; +2 pulses bilaterally MSK: Ambulating independently, normal tone, normal gait Psych: Mood is stable, speech is normal.  Depression screen [facility] 2/9 01/10/2021 07/05/2020 05/30/2020  Decreased Interest 0 0 0  Down, Depressed, Hopeless 0 0 0  PHQ - 2 Score 0 0 0  Altered sleeping - 0 -  Tired, decreased energy - 0 -  Change in appetite - 0 -  Feeling bad or failure about yourself  - 0 -  Trouble concentrating - 0 -  Moving slowly or fidgety/restless - 0 -  Suicidal thoughts - 0 -  PHQ-9 Score - 0 -  Difficult doing work/chores - - -   GAD 7 : Generalized Anxiety Score 01/10/2021  Nervous, Anxious, on Edge 0  Control/stop worrying 0  Worry too much - different things 0  Trouble relaxing 0  Restless 0  Easily annoyed or irritable 0  Afraid - awful might happen 0  Total GAD 7 Score 0  Anxiety Difficulty Not difficult at all   Assessment/ Plan: 65 y.o. female   Bipolar I disorder, most recent episode (or current) depressed ([place]) - Plan: lithium carbonate 300 MG capsule, Urinalysis, Lithium level, Basic Metabolic Panel, PTH, Intact and Calcium, TSH  Encounter for lithium monitoring - Plan: Urinalysis, Lithium level, Basic Metabolic Panel, PTH, Intact and Calcium, TSH  Weight loss counseling, encounter for  Her mood has been stable.  Check routine labs for lithium monitoring.  She is not demonstrating any red flag signs or symptoms today.  Lithium renewed.  She desires weight loss due to some mild weight gain during the holidays.  Technically her BMI is within normal range so I do not necessarily think  she needs to lose much.  However we did discuss increasing core exercises.  Could consider Plenity but do not think that she needs any prescription weight loss interventions.  Glad to refer her to a nutritionist should she desire.  No orders of the defined types were placed in this encounter.  No orders of the defined types were placed in this encounter.    Janora Norlander, DO [place] [phone]

## 2021-01-10 NOTE — Patient Instructions (Addendum)
Rest of meds good til your physical in June.  You had labs performed today.  You will be contacted with the results of the labs once they are available, usually in the next 3 business days for routine lab work.  If you have an active my chart account, they will be released to your MyChart.  If you prefer to have these labs released to you via telephone, please let us know.  Dexa due 12/2021.

## 2021-01-11 LAB — TSH: TSH: 1.36 u[IU]/mL (ref 0.450–4.500)

## 2021-01-11 LAB — CYTOLOGY - PAP
Comment: NEGATIVE
Diagnosis: NEGATIVE
High risk HPV: NEGATIVE

## 2021-01-11 LAB — PTH, INTACT AND CALCIUM: PTH: 47 pg/mL (ref 15–65)

## 2021-01-11 LAB — BASIC METABOLIC PANEL
BUN/Creatinine Ratio: 11 — ABNORMAL LOW (ref 12–28)
BUN: 9 mg/dL (ref 8–27)
CO2: 24 mmol/L (ref 20–29)
Calcium: 10.2 mg/dL (ref 8.7–10.3)
Chloride: 105 mmol/L (ref 96–106)
Creatinine, Ser: 0.82 mg/dL (ref 0.57–1.00)
Glucose: 85 mg/dL (ref 70–99)
Potassium: 4.4 mmol/L (ref 3.5–5.2)
Sodium: 142 mmol/L (ref 134–144)
eGFR: 80 mL/min/{1.73_m2} (ref >59)

## 2021-01-11 LAB — LITHIUM LEVEL: Lithium Lvl: 1 mmol/L (ref 0.5–1.2)

## 2021-01-19 ENCOUNTER — Ambulatory Visit: Payer: BC Managed Care – PPO | Admitting: Nurse Practitioner

## 2021-01-19 ENCOUNTER — Telehealth: Payer: Self-pay | Admitting: *Deleted

## 2021-01-19 NOTE — Telephone Encounter (Signed)
Patient called stating she never heard back about her pap smear results.  I informed patient normal results and Dr.Jertson sent a my chart message.

## 2021-03-29 ENCOUNTER — Ambulatory Visit: Payer: BC Managed Care – PPO | Admitting: Physician Assistant

## 2021-05-01 ENCOUNTER — Ambulatory Visit: Payer: BC Managed Care – PPO | Admitting: Family Medicine

## 2021-05-01 ENCOUNTER — Encounter: Payer: Self-pay | Admitting: Family Medicine

## 2021-05-01 DIAGNOSIS — N3000 Acute cystitis without hematuria: Secondary | ICD-10-CM | POA: Diagnosis not present

## 2021-05-01 LAB — URINALYSIS, ROUTINE W REFLEX MICROSCOPIC
Bilirubin, UA: NEGATIVE
Glucose, UA: NEGATIVE
Ketones, UA: NEGATIVE
Nitrite, UA: NEGATIVE
Protein,UA: NEGATIVE
Specific Gravity, UA: 1.01 (ref 1.005–1.030)
Urobilinogen, Ur: 0.2 mg/dL (ref 0.2–1.0)
pH, UA: 7 (ref 5.0–7.5)

## 2021-05-01 LAB — MICROSCOPIC EXAMINATION: Epithelial Cells (non renal): NONE SEEN /hpf (ref 0–10)

## 2021-05-01 MED ORDER — SULFAMETHOXAZOLE-TRIMETHOPRIM 800-160 MG PO TABS: 1.0000 | ORAL_TABLET | Freq: Two times a day (BID) | ORAL | 0 refills | Status: AC

## 2021-05-01 NOTE — Progress Notes (Signed)
? ?Subjective: ?CC:UTI ?PCP: Janora Norlander, DO ?OEU:MPNTIRW [name] is a 66 y.o. female presenting to clinic today for: ? ?1.  UTI ?Patient reports urinary frequency that has been ongoing for the last several days.  She gets up frequently at nighttime to go the bathroom.  She does feel like she evacuates her bladder totally.  Denies any nausea, vomiting, fevers or dysuria.  She is hydrating without difficulty ? ? ?ROS: Per HPI ? ?Allergies  ?Allergen Reactions  ? Penicillins Rash  ? ?Past Medical History:  ?Diagnosis Date  ? Atypical mole 06/24/1997  ? center back-moderate  ? Atypical mole 06/18/2005  ? left arm,right upper back-slight  ? Atypical mole 06/18/2005  ? lower mid back-moderate tx-widershave  ? Atypical mole 06/17/2006  ? right lower abdomen-features of a deep nevus  ? Atypical mole 06/25/2016  ? left thigh crease-mild  ? Atypical mole 06/25/2016  ? mid chest-moderate  ? Atypical mole 11/28/2017  ? left neck, right chest-moderate  ? Benign neoplasm of colon 04/2008  ? tubular adenoma  ? Bipolar I disorder, most recent episode (or current) depressed   ? Headache(784.0)   ? Other and unspecified hyperlipidemia   ? Other atopic dermatitis and related conditions   ? Squamous cell carcinoma of skin 12/14/2016  ? mid left back  ? STD (sexually transmitted disease) 1989  ? HSV  ? ? ?Current Outpatient Medications:  ?  Calcium Citrate-Vitamin D (CALCIUM + D PO), Take by mouth., Disp: , Rfl:  ?  Ferrous Sulfate (IRON PO), Take by mouth., Disp: , Rfl:  ?  ibuprofen (IBU) 800 MG tablet, Take 1 tablet (800 mg total) by mouth every 8 (eight) hours as needed., Disp: 90 tablet, Rfl: 3 ?  lithium carbonate 300 MG capsule, TAKE ONE CAPSULE BY MOUTH THREE TIMES DAILY WITH MEALS, Disp: 270 capsule, Rfl: 1 ?  omeprazole (PRILOSEC) 20 MG capsule, Take 1 capsule (20 mg total) by mouth daily., Disp: 90 capsule, Rfl: 3 ?  valACYclovir (VALTREX) 500 MG tablet, Take one tablet by mouth daily, increase to twice daily for 3  days for a outbreak, Disp: 90 tablet, Rfl: 4 ?  VITAMIN D PO, Take by mouth., Disp: , Rfl:  ?Social History  ? ?Socioeconomic History  ? Marital status: Widowed  ?  Spouse name: Not on file  ? Number of children: 2  ? Years of education: Not on file  ? Highest education level: Not on file  ?Occupational History  ? Occupation: TEACHER ASSISTANT  ?  Employer: [employer]  ?Tobacco Use  ? Smoking status: Never  ? Smokeless tobacco: Never  ?Vaping Use  ? Vaping Use: Never used  ?Substance and Sexual Activity  ? Alcohol use: No  ? Drug use: No  ? Sexual activity: Not Currently  ?  Partners: Male  ?  Birth control/protection: Post-menopausal  ?Other Topics Concern  ? Not on file  ?Social History Narrative  ? Patient is a retired Consulting civil engineer for [employer], retired 2019.  ? She has a granddaughter, whom she cares for during the week.  ? ?Social Determinants of Health  ? ?Financial Resource Strain: Not on file  ?Food Insecurity: Not on file  ?Transportation Needs: Not on file  ?Physical Activity: Not on file  ?Stress: Not on file  ?Social Connections: Not on file  ?Intimate Partner Violence: Not on file  ? ?Family History  ?Problem Relation Age of Onset  ? Heart disease Father   ? Hypertension  Father   ? Thyroid disease Mother   ? Osteoporosis Mother   ? Colon cancer Maternal Grandmother 43  ? Breast cancer Neg Hx   ? ? ?Objective: ? ?Physical Examination:  ?General: Awake, alert, well nourished, No acute distress ?GU: No suprapubic tenderness to palpation.  No CVA tenderness palpation ? ?Assessment/ Plan: ?66 y.o. female  ? ?Acute cystitis without hematuria - Plan: Urinalysis, Routine w reflex microscopic, sulfamethoxazole-trimethoprim (BACTRIM DS) 800-160 MG tablet, Urine Culture ? ?Urine culture sent.  Responded well to Bactrim last time so we will get this reordered for her.  Continue adequate hydration.  Monitor for signs and symptoms that are red flags.  Follow-up prn ? ? ?Orders Placed This  Encounter  ?Procedures  ? Urinalysis, Routine w reflex microscopic  ? ?No orders of the defined types were placed in this encounter. ? ? ? ?Janora Norlander, DO ?[place] ?[phone] ? ?

## 2021-05-04 LAB — URINE CULTURE

## 2021-05-10 ENCOUNTER — Telehealth: Payer: Self-pay | Admitting: Family Medicine

## 2021-05-10 NOTE — Telephone Encounter (Signed)
Patient wanted to verify medication she was taking was the correct one. Reassured patient. She verbalized understanding  ?

## 2021-05-10 NOTE — Telephone Encounter (Signed)
Patient said she finished antibiotic for uti and is still going to the bathroom frequently. She would like to speak to nurse. Please call back and advise.  ?

## 2021-07-11 ENCOUNTER — Ambulatory Visit (INDEPENDENT_AMBULATORY_CARE_PROVIDER_SITE_OTHER): Payer: BC Managed Care – PPO | Admitting: Family Medicine

## 2021-07-11 ENCOUNTER — Encounter: Payer: Self-pay | Admitting: Family Medicine

## 2021-07-11 VITALS — BP 111/69 | HR 61 | Temp 98.3°F | Ht 65.0 in | Wt 141.1 lb

## 2021-07-11 DIAGNOSIS — Z79899 Other long term (current) drug therapy: Secondary | ICD-10-CM

## 2021-07-11 DIAGNOSIS — Z5181 Encounter for therapeutic drug level monitoring: Secondary | ICD-10-CM | POA: Diagnosis not present

## 2021-07-11 DIAGNOSIS — F3130 Bipolar disorder, current episode depressed, mild or moderate severity, unspecified: Secondary | ICD-10-CM

## 2021-07-11 DIAGNOSIS — M8588 Other specified disorders of bone density and structure, other site: Secondary | ICD-10-CM

## 2021-07-11 DIAGNOSIS — K2100 Gastro-esophageal reflux disease with esophagitis, without bleeding: Secondary | ICD-10-CM

## 2021-07-11 DIAGNOSIS — K5909 Other constipation: Secondary | ICD-10-CM | POA: Diagnosis not present

## 2021-07-11 DIAGNOSIS — Z0001 Encounter for general adult medical examination with abnormal findings: Secondary | ICD-10-CM | POA: Diagnosis not present

## 2021-07-11 DIAGNOSIS — Z6823 Body mass index (BMI) 23.0-23.9, adult: Secondary | ICD-10-CM | POA: Diagnosis not present

## 2021-07-11 DIAGNOSIS — Z0000 Encounter for general adult medical examination without abnormal findings: Secondary | ICD-10-CM

## 2021-07-11 DIAGNOSIS — E7800 Pure hypercholesterolemia, unspecified: Secondary | ICD-10-CM

## 2021-07-11 LAB — URINALYSIS
Bilirubin, UA: NEGATIVE
Glucose, UA: NEGATIVE
Ketones, UA: NEGATIVE
Nitrite, UA: NEGATIVE
Protein,UA: NEGATIVE
RBC, UA: NEGATIVE
Specific Gravity, UA: 1.015 (ref 1.005–1.030)
Urobilinogen, Ur: 0.2 mg/dL (ref 0.2–1.0)
pH, UA: 7 (ref 5.0–7.5)

## 2021-07-11 MED ORDER — LINACLOTIDE 72 MCG PO CAPS: 72.0000 ug | ORAL_CAPSULE | Freq: Every day | ORAL | 0 refills | Status: DC

## 2021-07-11 MED ORDER — IBUPROFEN 800 MG PO TABS: 800.0000 mg | ORAL_TABLET | Freq: Three times a day (TID) | ORAL | 3 refills | Status: DC | PRN

## 2021-07-11 MED ORDER — OMEPRAZOLE 20 MG PO CPDR: 20.0000 mg | DELAYED_RELEASE_CAPSULE | Freq: Every day | ORAL | 3 refills | Status: DC

## 2021-07-11 MED ORDER — LITHIUM CARBONATE 300 MG PO CAPS: ORAL_CAPSULE | ORAL | 1 refills | Status: DC

## 2021-07-12 LAB — CMP14+EGFR
ALT: 13 IU/L (ref 0–32)
AST: 13 IU/L (ref 0–40)
Albumin/Globulin Ratio: 2 (ref 1.2–2.2)
Albumin: 4.5 g/dL (ref 3.8–4.8)
Alkaline Phosphatase: 70 IU/L (ref 44–121)
BUN/Creatinine Ratio: 11 — ABNORMAL LOW (ref 12–28)
BUN: 8 mg/dL (ref 8–27)
Bilirubin Total: 0.4 mg/dL (ref 0.0–1.2)
CO2: 21 mmol/L (ref 20–29)
Calcium: 10 mg/dL (ref 8.7–10.3)
Chloride: 106 mmol/L (ref 96–106)
Creatinine, Ser: 0.74 mg/dL (ref 0.57–1.00)
Globulin, Total: 2.2 g/dL (ref 1.5–4.5)
Glucose: 90 mg/dL (ref 70–99)
Potassium: 4.1 mmol/L (ref 3.5–5.2)
Sodium: 139 mmol/L (ref 134–144)
Total Protein: 6.7 g/dL (ref 6.0–8.5)
eGFR: 90 mL/min/{1.73_m2} (ref >59)

## 2021-07-12 LAB — LITHIUM LEVEL: Lithium Lvl: 0.6 mmol/L (ref 0.5–1.2)

## 2021-07-12 LAB — CBC
Hematocrit: 40 % (ref 34.0–46.6)
Hemoglobin: 13.5 g/dL (ref 11.1–15.9)
MCH: 29.7 pg (ref 26.6–33.0)
MCHC: 33.8 g/dL (ref 31.5–35.7)
MCV: 88 fL (ref 79–97)
Platelets: 263 10*3/uL (ref 150–450)
RBC: 4.55 x10E6/uL (ref 3.77–5.28)
RDW: 13.1 % (ref 11.7–15.4)
WBC: 5.6 10*3/uL (ref 3.4–10.8)

## 2021-07-12 LAB — TSH: TSH: 1.28 u[IU]/mL (ref 0.450–4.500)

## 2021-07-12 LAB — PTH, INTACT AND CALCIUM: PTH: 40 pg/mL (ref 15–65)

## 2021-07-12 LAB — VITAMIN D 25 HYDROXY (VIT D DEFICIENCY, FRACTURES): Vit D, 25-Hydroxy: 54.3 ng/mL (ref 30.0–100.0)

## 2021-11-11 IMAGING — MG DIGITAL SCREENING BILAT W/ TOMO W/ CAD
8 series · 9 of 24 positions shown · non-contrast
Comparison: Previous exam(s).

CLINICAL DATA: Screening.

EXAM:
DIGITAL SCREENING BILATERAL MAMMOGRAM WITH TOMO AND CAD

[L MLO synth-2D]
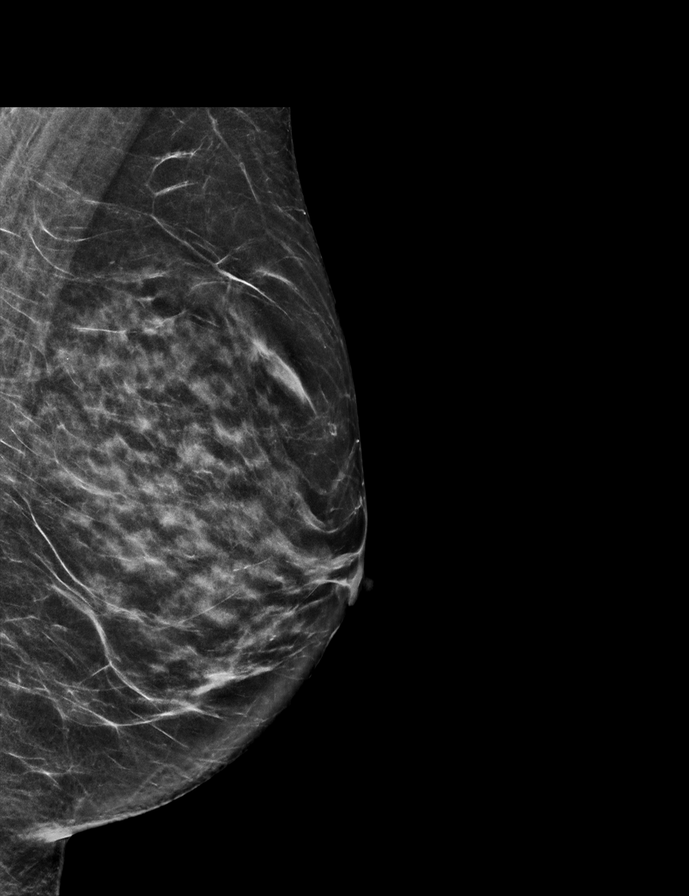

[R MLO synth-2D]
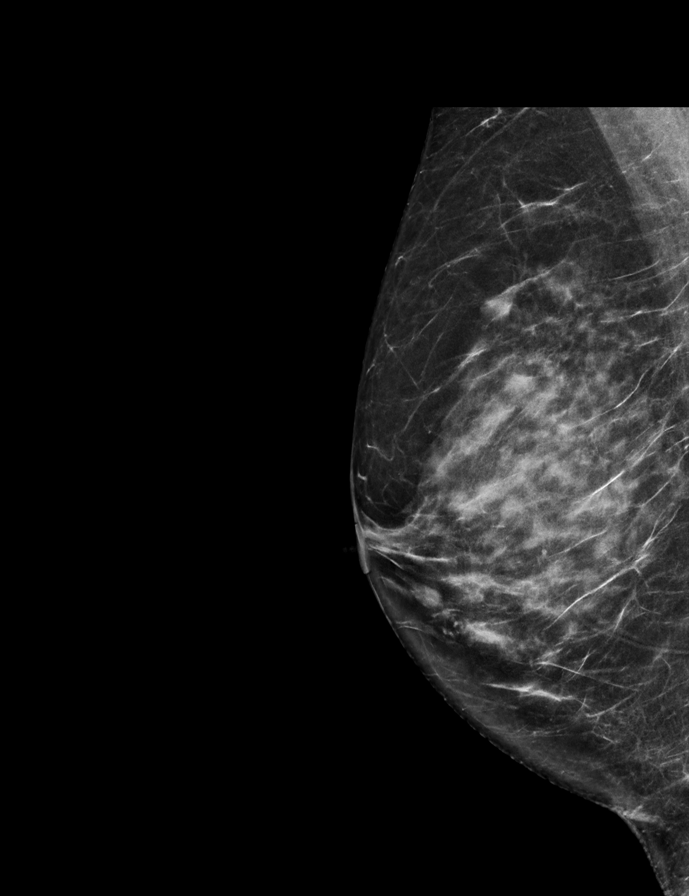

[R CC synth-2D]
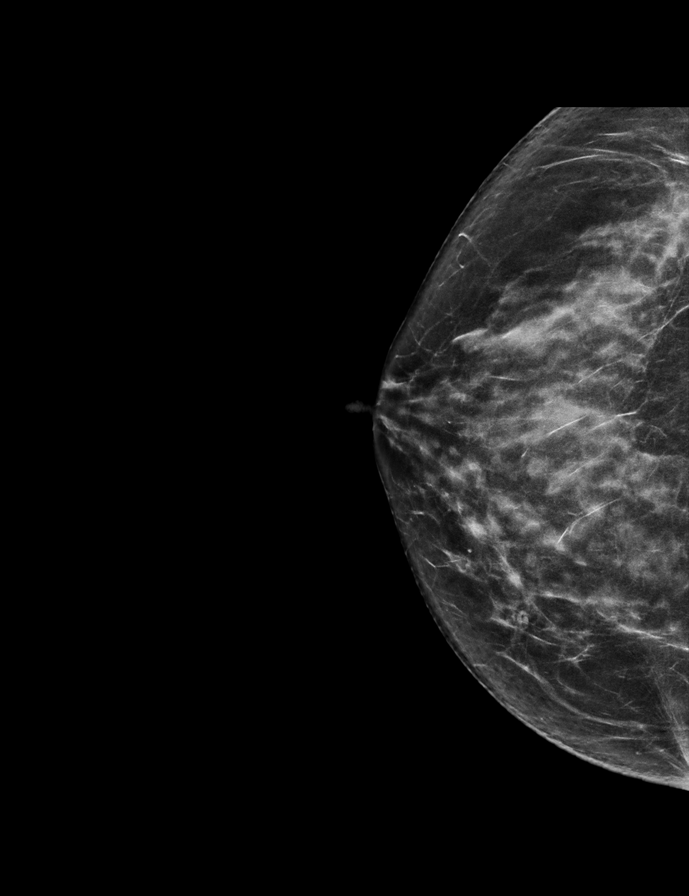

[L CC synth-2D]
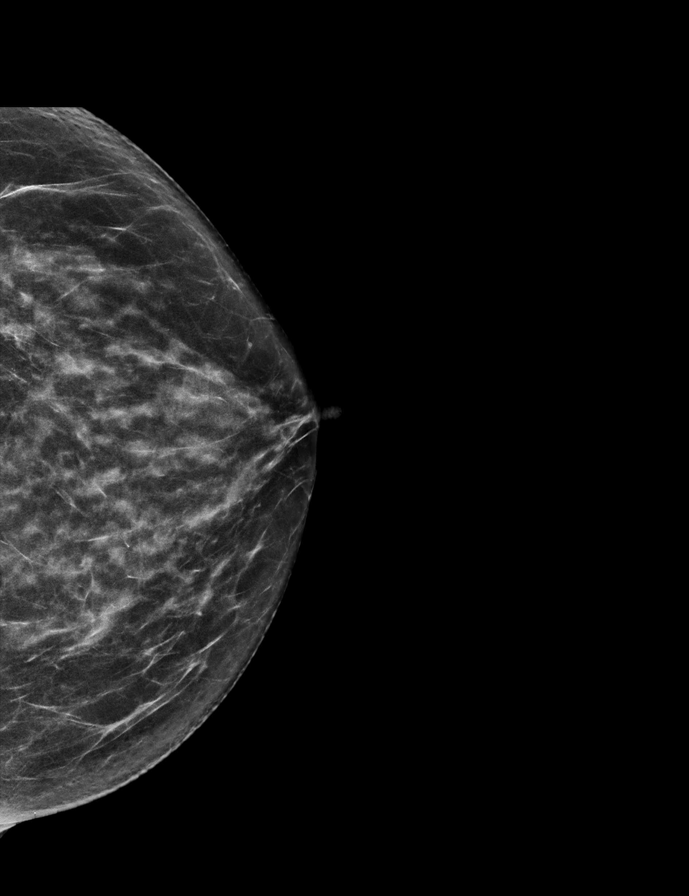

[R CC tomo · 2 of 65 frames shown]
[frame 21/65]
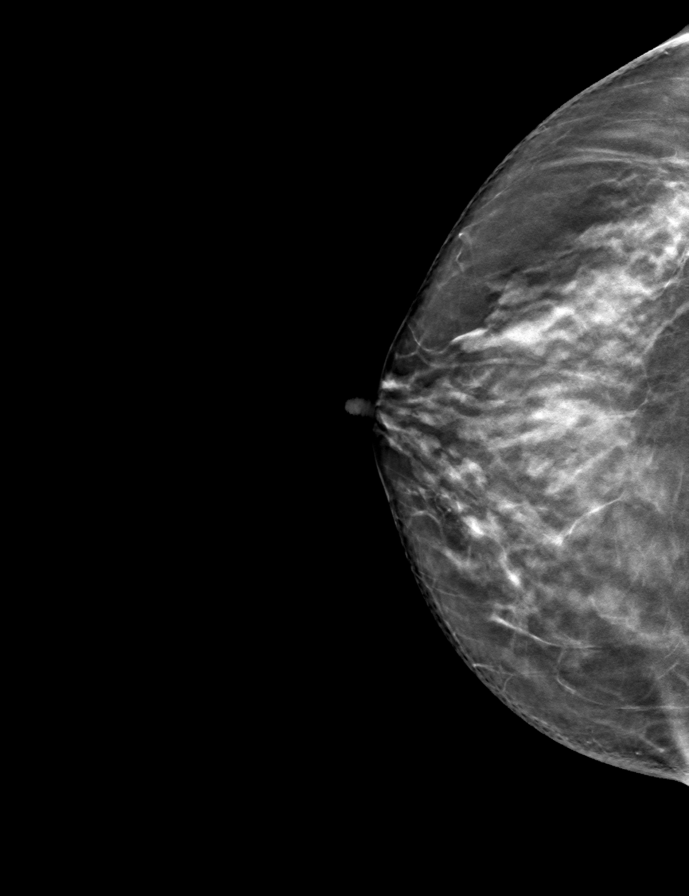
[frame 33/65]
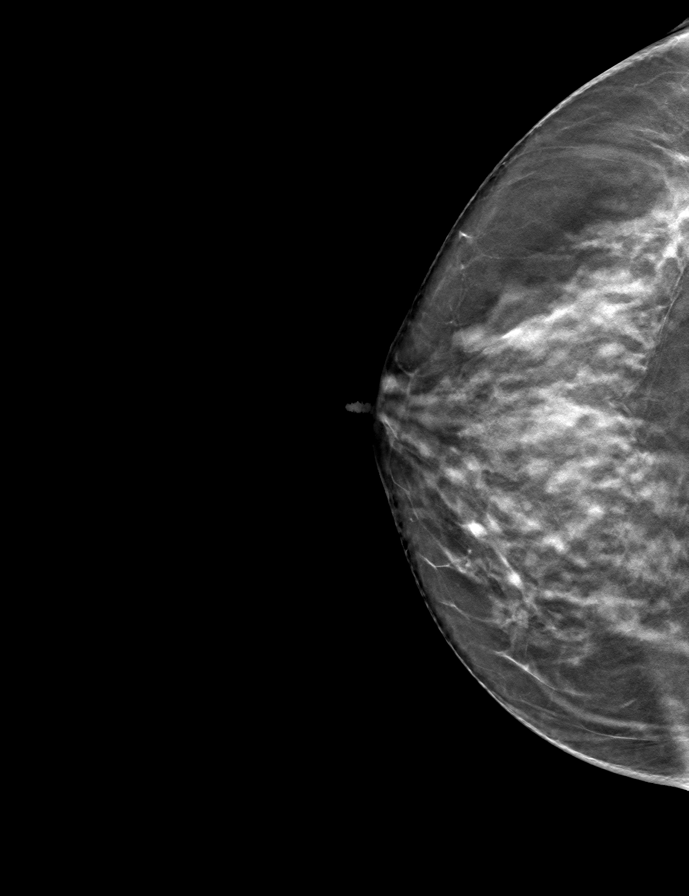

[L CC tomo · tomo slice 31/61.0]
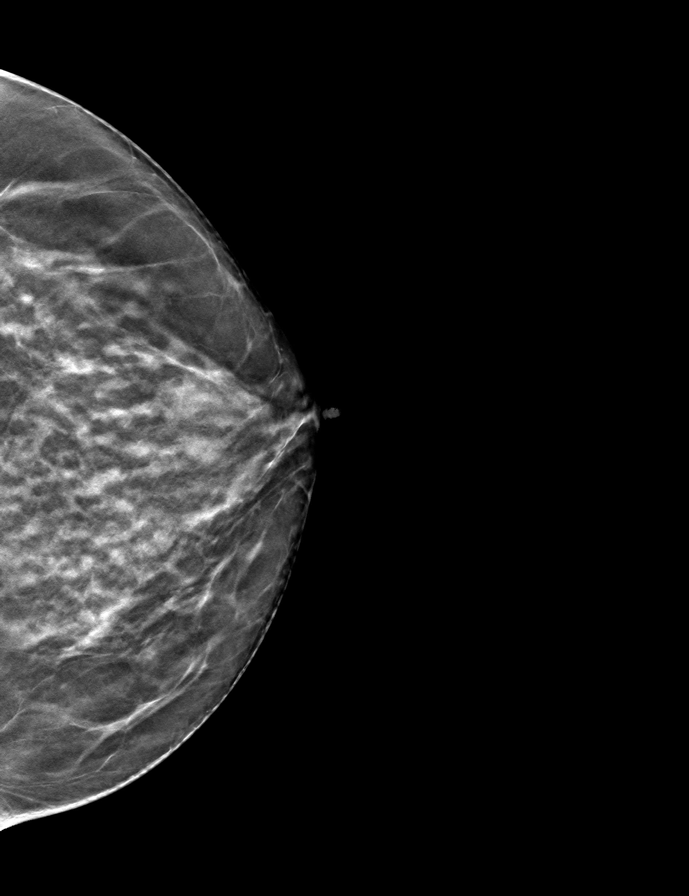

[L MLO tomo · tomo slice 31/60.0]
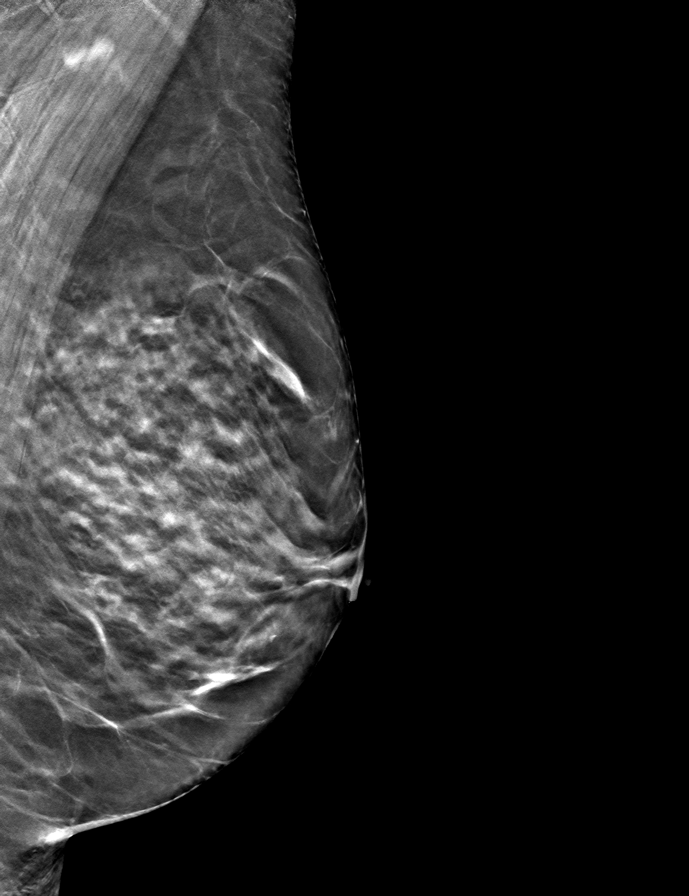

[R MLO tomo · tomo slice 33/65.0]
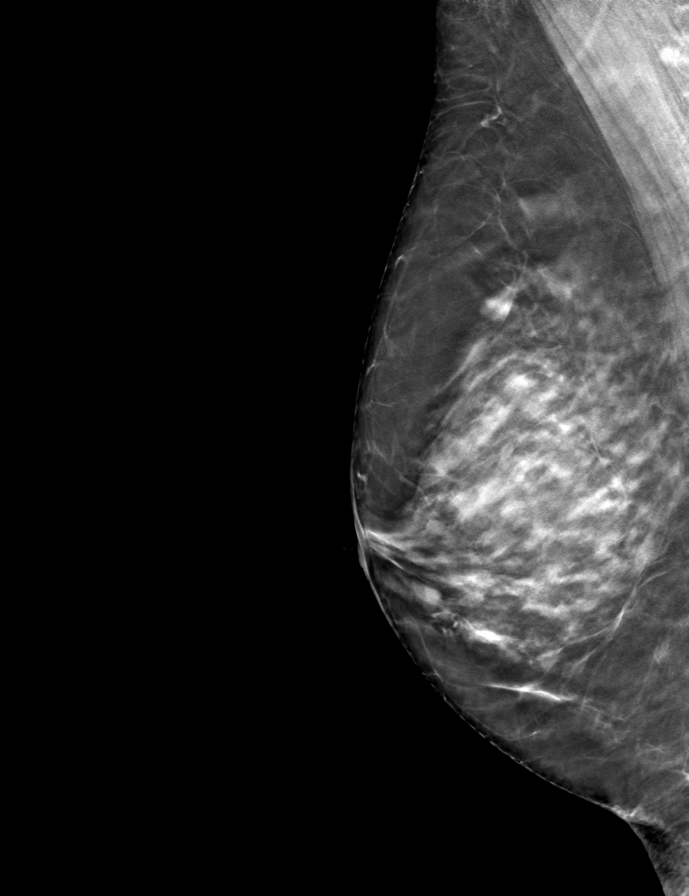

[9 of 24 positions shown; findings below may reference images not displayed]

ACR Breast Density Category d: The breast tissue is extremely dense,
which lowers the sensitivity of mammography
FINDINGS: There are no findings suspicious for malignancy. Images were
processed with CAD.
IMPRESSION: No mammographic evidence of malignancy. A result letter of this
screening mammogram will be mailed directly to the patient.

RECOMMENDATION:
Screening mammogram in one year. (Code:WO-0-ZI0)

BI-RADS CATEGORY  1: Negative.

## 2021-11-23 ENCOUNTER — Other Ambulatory Visit: Payer: Self-pay | Admitting: Obstetrics and Gynecology

## 2021-11-23 DIAGNOSIS — Z1231 Encounter for screening mammogram for malignant neoplasm of breast: Secondary | ICD-10-CM

## 2022-01-05 ENCOUNTER — Ambulatory Visit
Admission: RE | Admit: 2022-01-05 | Discharge: 2022-01-05 | Disposition: A | Discharge status: Home / Self Care | Payer: BC Managed Care – PPO | Source: Ambulatory Visit | Attending: Obstetrics and Gynecology | Admitting: Obstetrics and Gynecology

## 2022-01-05 ENCOUNTER — Other Ambulatory Visit (HOSPITAL_COMMUNITY)
Admission: RE | Admit: 2022-01-05 | Discharge: 2022-01-05 | Disposition: A | Discharge status: Home / Self Care | Payer: BC Managed Care – PPO | Source: Ambulatory Visit | Attending: Obstetrics and Gynecology | Admitting: Obstetrics and Gynecology

## 2022-01-05 ENCOUNTER — Ambulatory Visit: Payer: BC Managed Care – PPO | Admitting: Radiology

## 2022-01-05 ENCOUNTER — Ambulatory Visit (INDEPENDENT_AMBULATORY_CARE_PROVIDER_SITE_OTHER): Payer: BC Managed Care – PPO | Admitting: Radiology

## 2022-01-05 ENCOUNTER — Encounter: Payer: Self-pay | Admitting: Radiology

## 2022-01-05 VITALS — BP 118/80 | HR 74 | Ht 62.0 in | Wt 142.0 lb

## 2022-01-05 DIAGNOSIS — Z01419 Encounter for gynecological examination (general) (routine) without abnormal findings: Secondary | ICD-10-CM | POA: Diagnosis not present

## 2022-01-05 DIAGNOSIS — N762 Acute vulvitis: Secondary | ICD-10-CM | POA: Diagnosis not present

## 2022-01-05 DIAGNOSIS — Z8742 Personal history of other diseases of the female genital tract: Secondary | ICD-10-CM

## 2022-01-05 DIAGNOSIS — Z1231 Encounter for screening mammogram for malignant neoplasm of breast: Secondary | ICD-10-CM

## 2022-01-05 MED ORDER — NYSTATIN-TRIAMCINOLONE 100000-0.1 UNIT/GM-% EX OINT: 1.0000 | TOPICAL_OINTMENT | Freq: Two times a day (BID) | CUTANEOUS | 0 refills | Status: DC

## 2022-01-05 NOTE — Progress Notes (Signed)
   [name] Dec 26, 1955 [id number]   History: Postmenopausal 66 y.o. presents for annual exam. LSIL pap 2021, normal 2022. C/o some external itching and burning on perineum x 2 days.   Gynecologic History Postmenopausal Last Pap: 2022. Results were: normal Last mammogram: 2022. Results were: normal Last colonoscopy: 2015 HRT use: no  Obstetric History OB History  Gravida Para Term Preterm AB Living  '3 2 2 '$ 0 1 2  SAB IAB Ectopic Multiple Live Births  0 1 0 0 2    # Outcome Date GA Lbr Len/2nd Weight Sex Delivery Anes PTL Lv  3 Term 1992        LIV  2 Term 51    F CS-Unspec   LIV  1 IAB              The following portions of the patient's history were reviewed and updated as appropriate: allergies, current medications, past family history, past medical history, past social history, past surgical history, and problem list.  Review of Systems Pertinent items noted in HPI and remainder of comprehensive ROS otherwise negative.  Past medical history, past surgical history, family history and social history were all reviewed and documented in the EPIC chart.  Exam:  Vitals:   01/05/22 1343  BP: 118/80  Pulse: 74  Weight: 142 lb (64.4 kg)  Height: '5\' 2"'$  (1.575 m)   Body mass index is 25.97 kg/m.  General appearance:  Normal Thyroid:  Symmetrical, normal in size, without palpable masses or nodularity. Respiratory  Auscultation:  Clear without wheezing or rhonchi Cardiovascular  Auscultation:  Regular rate, without rubs, murmurs or gallops  Edema/varicosities:  Not grossly evident Abdominal  Soft,nontender, without masses, guarding or rebound.  Liver/spleen:  No organomegaly noted  Hernia:  None appreciated  Skin  Inspection:  Grossly normal Breasts: Examined lying and sitting.   Right: Without masses, retractions, nipple discharge or axillary adenopathy.   Left: Without masses, retractions, nipple discharge or axillary adenopathy. Genitourinary    Inguinal/mons:  Normal without inguinal adenopathy  External genitalia:  Normal appearing vulva with no masses, tenderness, or lesions.   BUS/Urethra/Skene's glands:  Normal  Vagina:  Normal appearing with normal color and discharge, no lesions. Atrophy: moderate   Cervix:  Normal appearing without discharge or lesions  Uterus:  Normal in size, shape and contour.  Midline and mobile, nontender  Adnexa/parametria:     Rt: Normal in size, without masses or tenderness.   Lt: Normal in size, without masses or tenderness.  Anus and perineum: erythema    Patient informed chaperone available to be present for breast and pelvic exam. Patient has requested no chaperone to be present. Patient has been advised what will be completed during breast and pelvic exam.   Assessment/Plan:   1. Well woman exam with routine gynecological exam - mammogram scheduled this afternoon  2. History of abnormal cervical Papanicolaou smear LSIL pap 2021, normal pap 2022 - Cytology - PAP( [place])  3. Acute vulvitis  - nystatin-triamcinolone ointment (MYCOLOG); Apply 1 Application topically 2 (two) times daily.  Dispense: 30 g; Refill: 0    Discussed SBE, colonoscopy and DEXA screening as directed. Recommend 134mns of exercise weekly, including weight bearing exercise. Encouraged the use of seatbelts and sunscreen.  Return in 1 year for annual or sooner prn.  CRubbie BattiestB WHNP-BC, 2:01 PM 01/05/2022

## 2022-01-09 ENCOUNTER — Ambulatory Visit: Payer: BC Managed Care – PPO | Admitting: Obstetrics and Gynecology

## 2022-01-11 LAB — CYTOLOGY - PAP
Comment: NEGATIVE
Comment: NEGATIVE
Comment: NEGATIVE
Diagnosis: UNDETERMINED — AB
HPV 16: NEGATIVE
HPV 18 / 45: NEGATIVE
High risk HPV: POSITIVE — AB

## 2022-01-12 ENCOUNTER — Encounter: Payer: Self-pay | Admitting: Family Medicine

## 2022-01-12 ENCOUNTER — Other Ambulatory Visit: Payer: Self-pay

## 2022-01-12 ENCOUNTER — Ambulatory Visit: Payer: BC Managed Care – PPO | Admitting: Family Medicine

## 2022-01-12 ENCOUNTER — Ambulatory Visit: Payer: BC Managed Care – PPO

## 2022-01-12 VITALS — BP 134/87 | HR 67 | Temp 98.0°F | Resp 20 | Ht 62.0 in | Wt 144.0 lb

## 2022-01-12 DIAGNOSIS — E7800 Pure hypercholesterolemia, unspecified: Secondary | ICD-10-CM

## 2022-01-12 DIAGNOSIS — Z638 Other specified problems related to primary support group: Secondary | ICD-10-CM

## 2022-01-12 DIAGNOSIS — R87610 Atypical squamous cells of undetermined significance on cytologic smear of cervix (ASC-US): Secondary | ICD-10-CM

## 2022-01-12 DIAGNOSIS — M25511 Pain in right shoulder: Secondary | ICD-10-CM

## 2022-01-12 DIAGNOSIS — Z1159 Encounter for screening for other viral diseases: Secondary | ICD-10-CM

## 2022-01-12 DIAGNOSIS — F3130 Bipolar disorder, current episode depressed, mild or moderate severity, unspecified: Secondary | ICD-10-CM | POA: Diagnosis not present

## 2022-01-12 LAB — URINALYSIS
Bilirubin, UA: NEGATIVE
Glucose, UA: NEGATIVE
Ketones, UA: NEGATIVE
Nitrite, UA: NEGATIVE
Protein,UA: NEGATIVE
RBC, UA: NEGATIVE
Specific Gravity, UA: 1.02 (ref 1.005–1.030)
Urobilinogen, Ur: 0.2 mg/dL (ref 0.2–1.0)
pH, UA: 7 (ref 5.0–7.5)

## 2022-01-12 NOTE — Progress Notes (Signed)
Subjective: CC: Right shoulder pain PCP: Janora Norlander, DO QXI:HWTUUEK B Dilger is a 66 y.o. female presenting to clinic today for:  1.  Right shoulder pain Patient reports that she has been having some right-sided shoulder pain ever since she got her influenza shot in October.  She had this done at a pharmacy and she points to the anterior lateral aspect of the shoulder as the source of pain.  Predominantly in that deltoid area.  Does not report any gross swelling, discoloration.  Pain seems to be more prominent when she is lying down in bed.  She has some discomfort with overhead movements but nothing significant.  Does not report any sensory changes.    2.  Medication monitoring encounter Patient is compliant with her lithium.  She does not report any concerning symptoms or signs.  She does report some stress with regards to the health of her father, who has had quite a decline in his health over the last year.  There apparently is some discord as to what her father wants and how his other daughter, who was an ICU nurse, feels his care should be managed.  They will be having a family meeting soon to discuss this further as it has been recommended previously that her father be admitted to hospice  ROS: Per HPI  Allergies  Allergen Reactions   Penicillins Rash   Past Medical History:  Diagnosis Date   Atypical mole 06/24/1997   center back-moderate   Atypical mole 06/18/2005   left arm,right upper back-slight   Atypical mole 06/18/2005   lower mid back-moderate tx-widershave   Atypical mole 06/17/2006   right lower abdomen-features of a deep nevus   Atypical mole 06/25/2016   left thigh crease-mild   Atypical mole 06/25/2016   mid chest-moderate   Atypical mole 11/28/2017   left neck, right chest-moderate   Benign neoplasm of colon 04/2008   tubular adenoma   Bipolar I disorder, most recent episode (or current) depressed    Headache(784.0)    Other and unspecified  hyperlipidemia    Other atopic dermatitis and related conditions    Squamous cell carcinoma of skin 12/14/2016   mid left back   STD (sexually transmitted disease) 1989   HSV    Current Outpatient Medications:    Calcium Citrate-Vitamin D (CALCIUM + D PO), Take by mouth., Disp: , Rfl:    Ferrous Sulfate (IRON PO), Take by mouth., Disp: , Rfl:    ibuprofen (IBU) 800 MG tablet, Take 1 tablet (800 mg total) by mouth every 8 (eight) hours as needed., Disp: 90 tablet, Rfl: 3   linaclotide (LINZESS) 72 MCG capsule, Take 1 capsule (72 mcg total) by mouth daily before breakfast., Disp: 12 capsule, Rfl: 0   lithium carbonate 300 MG capsule, TAKE ONE CAPSULE BY MOUTH THREE TIMES DAILY WITH MEALS, Disp: 270 capsule, Rfl: 1   nystatin-triamcinolone ointment (MYCOLOG), Apply 1 Application topically 2 (two) times daily., Disp: 30 g, Rfl: 0   omeprazole (PRILOSEC) 20 MG capsule, Take 1 capsule (20 mg total) by mouth daily., Disp: 90 capsule, Rfl: 3   valACYclovir (VALTREX) 500 MG tablet, Take one tablet by mouth daily, increase to twice daily for 3 days for a outbreak, Disp: 90 tablet, Rfl: 4   VITAMIN D PO, Take by mouth., Disp: , Rfl:  Social History   Socioeconomic History   Marital status: Widowed    Spouse name: Not on file   Number of children: 2  Years of education: Not on file   Highest education level: Not on file  Occupational History   Occupation: TEACHER ASSISTANT    Employer: [employer]  Tobacco Use   Smoking status: Never   Smokeless tobacco: Never  Vaping Use   Vaping Use: Never used  Substance and Sexual Activity   Alcohol use: No   Drug use: No   Sexual activity: Not Currently    Partners: Male    Birth control/protection: Post-menopausal  Other Topics Concern   Not on file  Social History Narrative   Patient is a retired Consulting civil engineer for [employer], retired 2019.   She has a granddaughter, whom she cares for during the week.   Social Determinants  of Health   Financial Resource Strain: Not on file  Food Insecurity: Not on file  Transportation Needs: Not on file  Physical Activity: Not on file  Stress: Not on file  Social Connections: Not on file  Intimate Partner Violence: Not on file   Family History  Problem Relation Age of Onset   Heart disease Father    Hypertension Father    Thyroid disease Mother    Osteoporosis Mother    Colon cancer Maternal Grandmother 73   Breast cancer Neg Hx     Objective: Office vital signs reviewed. BP 134/87   Pulse 67   Temp 98 F (36.7 C) (Temporal)   Resp 20   Ht 5' 2" (1.575 m)   Wt 144 lb (65.3 kg)   LMP 03/16/2010 (Approximate)   SpO2 100%   BMI 26.34 kg/m   Physical Examination:  General: Awake, alert, well nourished, No acute distress HEENT: sclera white, MMM.  No exophthalmos.  No goiter Cardio: regular rate and rhythm, S1S2 heard, no murmurs appreciated Pulm: clear to auscultation bilaterally, no wheezes, rhonchi or rales; normal work of breathing on room air MSK:   Right shoulder: She has no tenderness palpation to the shoulder or AC joint.  No palpable bony abnormalities.  Pain with Hawkins.  Pain with overhead/abduction.  Mild weakness with both full can and empty can testing Neuro: Light touch sensation grossly intact Psych: Very pleasant, interactive female      01/12/2022    8:08 AM 07/11/2021    9:25 AM 01/10/2021    8:30 AM  Depression screen PHQ 2/9  Decreased Interest 0 0 0  Down, Depressed, Hopeless 0 0 0  PHQ - 2 Score 0 0 0  Altered sleeping 1 2   Tired, decreased energy 0 0   Change in appetite 0 0   Feeling bad or failure about yourself  0 0   Trouble concentrating 0 0   Moving slowly or fidgety/restless 0 0   Suicidal thoughts 0 0   PHQ-9 Score 1 2   Difficult doing work/chores Not difficult at all Not difficult at all       01/12/2022    8:08 AM 07/11/2021    9:26 AM 01/10/2021    8:30 AM  GAD 7 : Generalized Anxiety Score  Nervous,  Anxious, on Edge 0 0 0  Control/stop worrying 0 0 0  Worry too much - different things 0 0 0  Trouble relaxing 0  0  Restless 0 0 0  Easily annoyed or irritable 0 0 0  Afraid - awful might happen 0 0 0  Total GAD 7 Score 0  0  Anxiety Difficulty Not difficult at all Not difficult at all Not difficult at all  Assessment/ Plan: 66 y.o. female   Acute pain of right shoulder - Plan: DG Shoulder Right  Stress due to family tension  Bipolar I disorder, most recent episode (or current) depressed ([place]) - Plan: Lithium level, CMP14+EGFR, PTH, Intact and Calcium, TSH, Urinalysis  Pure hypercholesterolemia - Plan: Lipid Panel  Encounter for hepatitis C screening test for low risk patient - Plan: Hepatitis C Antibody  Will obtain plain films of that right shoulder to further evaluate the pain.  I question tendinitis.  Possibly biceps tendinitis.  She did have some mild weakness with both full can and empty can testing as well as mild pain with abduction and Hawkins maneuver.  Could refer to Dr. Amedeo Kinsman if symptoms persist.  For now, I have recommended topical Voltaren gel.  Could consider referral to physical therapy if she decides.  Understandably there is some stress due to family tension.  Hopefully they can come to an agreement as to how to care for their father  Bipolar disorder seems to be nice and stable.  We will obtain labs  Fasting lipid also collected along with hepatitis C screening today  No orders of the defined types were placed in this encounter.  No orders of the defined types were placed in this encounter.    Janora Norlander, DO [place] [phone]

## 2022-01-12 NOTE — Patient Instructions (Addendum)
You had labs performed today.  You will be contacted with the results of the labs once they are available, usually in the next 3 business days for routine lab work.  If you have an active my chart account, they will be released to your MyChart.  If you prefer to have these labs released to you via telephone, please let us know.   Voltaren gel applied to the right shoulder 2-3 times daily for pain. If no improvement in 2 weeks, let me know and we can refer to Dr Amedeo Kinsman in [place] for injection.  Shoulder Impingement Syndrome  Shoulder impingement syndrome is a condition that causes pain when connective tissues (tendons) surrounding the shoulder joint become pinched. These tendons are part of the group including muscles and tissues that help to stabilize the shoulder (rotator cuff). Beneath the rotator cuff is a fluid-filled sac (bursa) that allows the muscles and tendons to glide smoothly. The bursa may become swollen or irritated (bursitis). Bursitis, swelling in the rotator cuff tendons, or both conditions can decrease how much space is under a bone in the shoulder joint (acromion), resulting in impingement. What are the causes? Shoulder impingement syndrome may be caused by bursitis or swelling of the rotator cuff tendons, which may result from: Repetitive overhead arm movements. Falling onto the shoulder. Weakness in the shoulder muscles. What increases the risk? You may be more likely to develop this condition if you: Play sports that involve throwing, such as baseball. Participate in sports such as tennis, volleyball, and swimming. Work as a Curator, Games developer, or Architect. Some people are also more likely to develop impingement syndrome because of the shape of their acromion bone. What are the signs or symptoms? The main symptom of this condition is pain on the front or side of the shoulder. The pain may: Get worse when lifting or raising the arm. Get worse at night. Wake you up  from sleeping. Feel sharp when the shoulder is moved and then fade to an ache. Other symptoms may include: Tenderness. Stiffness. Inability to raise the arm above shoulder level or behind the body. Weakness. How is this diagnosed? This condition may be diagnosed based on: Your symptoms and medical history. A physical exam. Imaging tests, such as: X-rays. MRI. Ultrasound. How is this treated? This condition may be treated by: Resting your shoulder and avoiding all activities that cause pain or put stress on the shoulder. Icing your shoulder. NSAIDs to help reduce pain and swelling. One or more injections of medicines to numb the area and reduce inflammation. Physical therapy. Surgery. This may be needed if nonsurgical treatments have not helped. Surgery may involve repairing the rotator cuff, reshaping the acromion, or removing the bursa. Follow these instructions at home: Managing pain, stiffness, and swelling  If directed, put ice on the injured area. Put ice in a plastic bag. Place a towel between your skin and the bag. Leave the ice on for 20 minutes, 2-3 times a day. Activity Rest and return to your normal activities as told by your health care provider. Ask your health care provider what activities are safe for you. Do exercises as told by your health care provider. General instructions Do not use any products that contain nicotine or tobacco, such as cigarettes, e-cigarettes, and chewing tobacco. These can delay healing. If you need help quitting, ask your health care provider. Ask your health care provider when it is safe for you to drive. Take over-the-counter and prescription medicines only as told by  your health care provider. Keep all follow-up visits as told by your health care provider. This is important. How is this prevented? Give your body time to rest between periods of activity. Be safe and responsible while being active. This will help you avoid  falls. Maintain physical fitness, including strength and flexibility. Contact a health care provider if: Your symptoms have not improved after 1-2 months of treatment and rest. You cannot lift your arm away from your body. Summary Shoulder impingement syndrome is a condition that causes pain when connective tissues (tendons) surrounding the shoulder joint become pinched. The main symptom of this condition is pain on the front or side of the shoulder. This condition is usually treated with rest, ice, and pain medicines as needed. This information is not intended to replace advice given to you by your health care provider. Make sure you discuss any questions you have with your health care provider. Document Revised: 10/11/2020 Document Reviewed: 10/12/2020 Elsevier Patient Education  [employer].

## 2022-01-13 LAB — CMP14+EGFR
ALT: 17 IU/L (ref 0–32)
AST: 14 IU/L (ref 0–40)
Albumin/Globulin Ratio: 1.9 (ref 1.2–2.2)
Albumin: 4.7 g/dL (ref 3.9–4.9)
Alkaline Phosphatase: 77 IU/L (ref 44–121)
BUN/Creatinine Ratio: 12 (ref 12–28)
BUN: 9 mg/dL (ref 8–27)
Bilirubin Total: 0.6 mg/dL (ref 0.0–1.2)
CO2: 20 mmol/L (ref 20–29)
Calcium: 10.2 mg/dL (ref 8.7–10.3)
Chloride: 108 mmol/L — ABNORMAL HIGH (ref 96–106)
Creatinine, Ser: 0.78 mg/dL (ref 0.57–1.00)
Globulin, Total: 2.5 g/dL (ref 1.5–4.5)
Glucose: 88 mg/dL (ref 70–99)
Potassium: 4.3 mmol/L (ref 3.5–5.2)
Sodium: 145 mmol/L — ABNORMAL HIGH (ref 134–144)
Total Protein: 7.2 g/dL (ref 6.0–8.5)
eGFR: 85 mL/min/{1.73_m2} (ref >59)

## 2022-01-13 LAB — LIPID PANEL
Chol/HDL Ratio: 3.9 ratio (ref 0.0–4.4)
Cholesterol, Total: 220 mg/dL — ABNORMAL HIGH (ref 100–199)
HDL: 56 mg/dL (ref >39)
LDL Chol Calc (NIH): 132 mg/dL — ABNORMAL HIGH (ref 0–99)
Triglycerides: 183 mg/dL — ABNORMAL HIGH (ref 0–149)
VLDL Cholesterol Cal: 32 mg/dL (ref 5–40)

## 2022-01-13 LAB — TSH: TSH: 1.72 u[IU]/mL (ref 0.450–4.500)

## 2022-01-13 LAB — HEPATITIS C ANTIBODY: Hep C Virus Ab: NONREACTIVE

## 2022-01-13 LAB — PTH, INTACT AND CALCIUM: PTH: 51 pg/mL (ref 15–65)

## 2022-01-13 LAB — LITHIUM LEVEL: Lithium Lvl: 0.7 mmol/L (ref 0.5–1.2)

## 2022-02-23 ENCOUNTER — Ambulatory Visit: Payer: BC Managed Care – PPO | Admitting: Obstetrics and Gynecology

## 2022-03-05 ENCOUNTER — Other Ambulatory Visit (INDEPENDENT_AMBULATORY_CARE_PROVIDER_SITE_OTHER): Payer: BC Managed Care – PPO

## 2022-03-05 DIAGNOSIS — M25511 Pain in right shoulder: Secondary | ICD-10-CM | POA: Diagnosis not present

## 2022-03-06 ENCOUNTER — Other Ambulatory Visit: Payer: Self-pay | Admitting: Family Medicine

## 2022-03-06 DIAGNOSIS — M25511 Pain in right shoulder: Secondary | ICD-10-CM

## 2022-03-09 ENCOUNTER — Ambulatory Visit: Payer: BC Managed Care – PPO | Admitting: Obstetrics and Gynecology

## 2022-03-15 ENCOUNTER — Other Ambulatory Visit: Payer: Self-pay | Admitting: Obstetrics and Gynecology

## 2022-03-15 DIAGNOSIS — A6000 Herpesviral infection of urogenital system, unspecified: Secondary | ICD-10-CM

## 2022-03-15 NOTE — Telephone Encounter (Signed)
Medication refill request: Valtrex  Last AEX:  01-05-22 JC Next AEX: not scheduled  Last MMG (if hormonal medication request): n/a Refill authorized: Today, please advise.   Medication pended for #90, 0RF. Please refill if appropriate.

## 2022-03-16 ENCOUNTER — Ambulatory Visit: Payer: BC Managed Care – PPO | Admitting: Orthopedic Surgery

## 2022-03-28 ENCOUNTER — Ambulatory Visit: Payer: BC Managed Care – PPO | Admitting: Obstetrics and Gynecology

## 2022-03-28 ENCOUNTER — Other Ambulatory Visit (HOSPITAL_COMMUNITY)
Admission: RE | Admit: 2022-03-28 | Discharge: 2022-03-28 | Disposition: A | Discharge status: Home / Self Care | Payer: BC Managed Care – PPO | Source: Ambulatory Visit | Attending: Obstetrics and Gynecology | Admitting: Obstetrics and Gynecology

## 2022-03-28 ENCOUNTER — Encounter: Payer: Self-pay | Admitting: Obstetrics and Gynecology

## 2022-03-28 VITALS — BP 124/70 | HR 89 | Wt 145.8 lb

## 2022-03-28 DIAGNOSIS — R87610 Atypical squamous cells of undetermined significance on cytologic smear of cervix (ASC-US): Secondary | ICD-10-CM | POA: Insufficient documentation

## 2022-03-28 DIAGNOSIS — R87810 Cervical high risk human papillomavirus (HPV) DNA test positive: Secondary | ICD-10-CM

## 2022-03-28 DIAGNOSIS — A6000 Herpesviral infection of urogenital system, unspecified: Secondary | ICD-10-CM | POA: Diagnosis not present

## 2022-03-28 MED ORDER — VALACYCLOVIR HCL 500 MG PO TABS: ORAL_TABLET | ORAL | 3 refills | Status: DC

## 2022-03-28 NOTE — Patient Instructions (Signed)

## 2022-03-28 NOTE — Progress Notes (Signed)
GYNECOLOGY  VISIT   HPI: 67 y.o.   Widowed White or Caucasian Not Hispanic or Latino  female   [phone] with Patient's last menstrual period was 03/16/2010 (approximate).   here for Colposcopy.  Recent pap in 12/23 with ASCUS/+HPV/-HPV 16, 18 &45  Pap in 12/22 was negative, negative HPV testing  Pap from 21 returned with LSIL/+HPV. Colpo unsatisfactory, cervix flush with vagina and very stenotic. Unable to perform an ECC even with vaginal estrogen and cytotec. Dr Berline Lopes recommended for pap/hpv testing in one year. See office note 02/17/20.     Patient states that she needs a refill for valtrex. She is currently on 500 mg a day, she used to get a 1000 mg and cut them in half. She will reach out if she wants to do this again.   GYNECOLOGIC HISTORY: Patient's last menstrual period was 03/16/2010 (approximate). Contraception:pmp Menopausal hormone therapy: none         OB History     Gravida  3   Para  2   Term  2   Preterm  0   AB  1   Living  2      SAB  0   IAB  1   Ectopic  0   Multiple  0   Live Births  2              Patient Active Problem List   Diagnosis Date Noted   UTI symptoms 06/20/2020   Cough 06/20/2020   Tendinitis of left rotator cuff 01/06/2020   Gastroesophageal reflux disease with esophagitis 01/05/2018   Varicose veins of leg with pain, left 01/31/2017   Osteopenia 10/14/2013   Vitamin D deficiency 03/23/2013   Bipolar I disorder, most recent episode (or current) depressed ([place])    Benign neoplasm of colon    Headache(784.0)    Other atopic dermatitis and related conditions    Hyperlipidemia     Past Medical History:  Diagnosis Date   Atypical mole 06/24/1997   center back-moderate   Atypical mole 06/18/2005   left arm,right upper back-slight   Atypical mole 06/18/2005   lower mid back-moderate tx-widershave   Atypical mole 06/17/2006   right lower abdomen-features of a deep nevus   Atypical mole 06/25/2016   left thigh  crease-mild   Atypical mole 06/25/2016   mid chest-moderate   Atypical mole 11/28/2017   left neck, right chest-moderate   Benign neoplasm of colon 04/2008   tubular adenoma   Bipolar I disorder, most recent episode (or current) depressed    Headache(784.0)    Other and unspecified hyperlipidemia    Other atopic dermatitis and related conditions    Squamous cell carcinoma of skin 12/14/2016   mid left back   STD (sexually transmitted disease) 1989   HSV    Past Surgical History:  Procedure Laterality Date   CESAREAN SECTION     x 2   WISDOM TOOTH EXTRACTION  age 69    Current Outpatient Medications  Medication Sig Dispense Refill   Calcium Citrate-Vitamin D (CALCIUM + D PO) Take by mouth.     Ferrous Sulfate (IRON PO) Take by mouth.     ibuprofen (IBU) 800 MG tablet Take 1 tablet (800 mg total) by mouth every 8 (eight) hours as needed. 90 tablet 3   lithium carbonate 300 MG capsule TAKE ONE CAPSULE BY MOUTH THREE TIMES DAILY WITH MEALS 270 capsule 1   omeprazole (PRILOSEC) 20 MG capsule Take 1 capsule (20  mg total) by mouth daily. 90 capsule 3   valACYclovir (VALTREX) 500 MG tablet TAKE ONE TABLET DAILY. (TWICE DAILY X3 DAYS FOR OUTBREAKS) 90 tablet 0   VITAMIN D PO Take by mouth.     nystatin-triamcinolone ointment (MYCOLOG) Apply 1 Application topically 2 (two) times daily. (Patient not taking: Reported on 03/28/2022) 30 g 0   No current facility-administered medications for this visit.     ALLERGIES: Penicillins  Family History  Problem Relation Age of Onset   Heart disease Father    Hypertension Father    Thyroid disease Mother    Osteoporosis Mother    Colon cancer Maternal Grandmother 42   Breast cancer Neg Hx     Social History   Socioeconomic History   Marital status: Widowed    Spouse name: Not on file   Number of children: 2   Years of education: Not on file   Highest education level: Not on file  Occupational History   Occupation: TEACHER ASSISTANT     Employer: [employer]  Tobacco Use   Smoking status: Never   Smokeless tobacco: Never  Vaping Use   Vaping Use: Never used  Substance and Sexual Activity   Alcohol use: No   Drug use: No   Sexual activity: Not Currently    Partners: Male    Birth control/protection: Post-menopausal  Other Topics Concern   Not on file  Social History Narrative   Patient is a retired Consulting civil engineer for [employer], retired 2019.   She has a granddaughter, whom she cares for during the week.   Social Determinants of Health   Financial Resource Strain: Not on file  Food Insecurity: Not on file  Transportation Needs: Not on file  Physical Activity: Not on file  Stress: Not on file  Social Connections: Not on file  Intimate Partner Violence: Not on file    Review of Systems  All other systems reviewed and are negative.   PHYSICAL EXAMINATION:    BP 124/70   Pulse 89   Wt 145 lb 12.8 oz (66.1 kg)   LMP 03/16/2010 (Approximate)   SpO2 100%   BMI 26.67 kg/m     General appearance: alert, cooperative and appears stated age  Pelvic: External genitalia:  no lesions              Urethra:  normal appearing urethra with no masses, tenderness or lesions              Bartholins and Skenes: normal                 Vagina: normal appearing vagina with normal color and discharge, no lesions              Cervix:  flush with the vagina. No clear external os seen  Colposcopy: unsatisfactory, no aceto-white changes. Lugols exam of entire vagina. Slight decrease in lugol's uptake at 10 o'clock, biopsy taken. Hemostatis obtained with use of silver nitrate.                Chaperone was present for exam.  1. ASCUS with positive high risk HPV cervical - Surgical pathology( / [facility])  2. Herpes simplex infection of genitourinary system - valACYclovir (VALTREX) 500 MG tablet; TAKE ONE TABLET DAILY. (TWICE DAILY X3 DAYS FOR OUTBREAKS)  Dispense: 90 tablet; Refill: 3

## 2022-03-30 ENCOUNTER — Telehealth: Payer: Self-pay

## 2022-03-30 LAB — SURGICAL PATHOLOGY

## 2022-03-30 NOTE — Telephone Encounter (Signed)
JJ pt calling, had colpo on 03/28/2022 and is inquiring how long she should wait before having intercourse? She states one thing told her wait until stop having discharge and another thing told her wait one week. Please advise/confirm.

## 2022-03-30 NOTE — Telephone Encounter (Signed)
Per ML: "No IC for at least 1 week after Colpo. Can check with Dr Talbert Nan when back in office."   Pt notified and voiced understanding. Will forward to Dr. Talbert Nan for anything additional she would like to add when she returns.

## 2022-04-02 ENCOUNTER — Telehealth: Payer: Self-pay

## 2022-04-02 NOTE — Telephone Encounter (Signed)
Saw her biopsy result on her My Chart and is calling to find out about it. I explained Dr. Talbert Nan out Friday and today but will return tomorrow.  I did tell her results are benign/no cancer and Dr. Talbert Nan will advise her about follow up recommendation when she reviews it and we will call her back then.

## 2022-04-03 ENCOUNTER — Telehealth: Payer: Self-pay

## 2022-04-03 NOTE — Telephone Encounter (Signed)
She is fine to have intercourse now.

## 2022-04-03 NOTE — Telephone Encounter (Signed)
-----   Message from Lauren Dom, MD sent at 04/03/2022 11:00 AM EDT ----- Please let the patient know that her biopsy returned with VAIN I, she needs a repeat pap with hpv testing in 12/24 at her annual exam.

## 2022-04-03 NOTE — Telephone Encounter (Signed)
I contacted patient with biopsy result and she was asking about this. How long should she wait to resume intercourse.

## 2022-04-03 NOTE — Telephone Encounter (Signed)
I spoke with patient and informed her of Dr. Gentry Fitz message on her result note.

## 2022-04-03 NOTE — Telephone Encounter (Signed)
Spoke with patient and informed her. °

## 2022-04-03 NOTE — Telephone Encounter (Signed)
Enc not needed. One already opened.

## 2022-04-06 ENCOUNTER — Encounter: Payer: Self-pay | Admitting: Orthopedic Surgery

## 2022-04-06 ENCOUNTER — Ambulatory Visit: Payer: BC Managed Care – PPO | Admitting: Orthopedic Surgery

## 2022-04-06 VITALS — BP 133/86 | HR 67 | Ht 65.0 in | Wt 145.6 lb

## 2022-04-06 DIAGNOSIS — M7581 Other shoulder lesions, right shoulder: Secondary | ICD-10-CM

## 2022-04-06 NOTE — Patient Instructions (Signed)

## 2022-04-06 NOTE — Progress Notes (Signed)
New Patient Visit  Assessment: [name] is a 67 y.o. female with the following: 1. Tendinitis of right rotator cuff  Plan: [name] has pain in her right shoulder.  No specific injury.  She has.  Range of motion.  Pretty good strength.  She notes the pain in her shoulder is constant, and more of a nuisance.  She is interested in an injection.  This was completed in clinic today.  I have also provided her with home exercise program.  I will see her as needed.  Procedure note injection - Right shoulder    Verbal consent was obtained to inject the right shoulder, subacromial space Timeout was completed to confirm the site of injection.   The skin was prepped with alcohol and ethyl chloride was sprayed at the injection site.  A 21-gauge needle was used to inject 40 mg of Depo-Medrol and 1% lidocaine (3 cc) into the subacromial space of the right shoulder using a posterolateral approach.  There were no complications.  A sterile bandage was applied.    Follow-up: Return if symptoms worsen or fail to improve.  Subjective:  Chief Complaint  Patient presents with   New Patient (Initial Visit)   Shoulder Pain    RT shoulder/ interested in having an injection today  NDC [phone]    History of Present Illness: [name] is a 67 y.o. female who has been referred by  Ronnie Doss, DO for evaluation of right shoulder pain.  Pain in the right shoulder for several months.  No specific injury.  Pain is in the anterior shoulder, and deep within the joint.  Pain is not severe, but is constant.  She has difficulty with motion.  She notes some discomfort while sitting still.  No numbness or tingling distally.  She has not had an injection.  Medications have not been   Review of Systems: No fevers or chills No numbness or tingling No chest pain No shortness of breath No bowel or bladder dysfunction No GI distress No headaches   Medical History:  Past Medical History:   Diagnosis Date   Atypical mole 06/24/1997   center back-moderate   Atypical mole 06/18/2005   left arm,right upper back-slight   Atypical mole 06/18/2005   lower mid back-moderate tx-widershave   Atypical mole 06/17/2006   right lower abdomen-features of a deep nevus   Atypical mole 06/25/2016   left thigh crease-mild   Atypical mole 06/25/2016   mid chest-moderate   Atypical mole 11/28/2017   left neck, right chest-moderate   Benign neoplasm of colon 04/2008   tubular adenoma   Bipolar I disorder, most recent episode (or current) depressed    Headache(784.0)    Other and unspecified hyperlipidemia    Other atopic dermatitis and related conditions    Squamous cell carcinoma of skin 12/14/2016   mid left back   STD (sexually transmitted disease) 1989   HSV    Past Surgical History:  Procedure Laterality Date   CESAREAN SECTION     x 2   WISDOM TOOTH EXTRACTION  age 26    Family History  Problem Relation Age of Onset   Heart disease Father    Hypertension Father    Thyroid disease Mother    Osteoporosis Mother    Colon cancer Maternal Grandmother 39   Breast cancer Neg Hx    Social History   Tobacco Use   Smoking status: Never   Smokeless tobacco: Never  Vaping Use  Vaping Use: Never used  Substance Use Topics   Alcohol use: No   Drug use: No    Allergies  Allergen Reactions   Penicillins Rash    Current Meds  Medication Sig   Calcium Citrate-Vitamin D (CALCIUM + D PO) Take by mouth.   Ferrous Sulfate (IRON PO) Take by mouth.   ibuprofen (IBU) 800 MG tablet Take 1 tablet (800 mg total) by mouth every 8 (eight) hours as needed.   lithium carbonate 300 MG capsule TAKE ONE CAPSULE BY MOUTH THREE TIMES DAILY WITH MEALS   omeprazole (PRILOSEC) 20 MG capsule Take 1 capsule (20 mg total) by mouth daily.   valACYclovir (VALTREX) 500 MG tablet TAKE ONE TABLET DAILY. (TWICE DAILY X3 DAYS FOR OUTBREAKS)    Objective: BP 133/86   Pulse 67   Ht 5\' 5"  (1.651  m)   Wt 145 lb 9.6 oz (66 kg)   LMP 03/16/2010 (Approximate)   BMI 24.23 kg/m   Physical Exam:  General: Alert and oriented. and No acute distress. Gait: Normal gait.  Right shoulder without deformity.  No atrophy is appreciated.  Tenderness palpation over the anterior shoulder.  Good range of motion.  Good strength.  Fingers are warm and well-perfused.  IMAGING: I personally reviewed images previously obtained in clinic  X-rays were previously obtained of the right shoulder.  Minimal degenerative changes noted.  Glenohumeral joint spaces preserved.  No evidence of migration.   New Medications:  No orders of the defined types were placed in this encounter.     Mordecai Rasmussen, MD  04/06/2022 11:27 PM

## 2022-05-16 ENCOUNTER — Other Ambulatory Visit: Payer: Self-pay | Admitting: Family Medicine

## 2022-05-16 DIAGNOSIS — F3130 Bipolar disorder, current episode depressed, mild or moderate severity, unspecified: Secondary | ICD-10-CM

## 2022-07-11 ENCOUNTER — Encounter: Payer: Self-pay | Admitting: Orthopedic Surgery

## 2022-07-11 ENCOUNTER — Ambulatory Visit (INDEPENDENT_AMBULATORY_CARE_PROVIDER_SITE_OTHER): Payer: Medicare Other | Admitting: Orthopedic Surgery

## 2022-07-11 VITALS — BP 123/77 | HR 63 | Ht 62.0 in | Wt 148.0 lb

## 2022-07-11 DIAGNOSIS — M7581 Other shoulder lesions, right shoulder: Secondary | ICD-10-CM

## 2022-07-11 MED ORDER — METHYLPREDNISOLONE ACETATE 40 MG/ML IJ SUSP
40.0000 mg | Freq: Once | INTRAMUSCULAR | Status: AC
  Administered 2022-07-11: 40 mg via INTRA_ARTICULAR

## 2022-07-11 NOTE — Progress Notes (Signed)
Return patient Visit  Assessment: [name] is a 67 y.o. female with the following: 1. Tendinitis of right rotator cuff  Plan: [name] continues to have pain in her right shoulder.  Pain is primarily in the anterior aspect the shoulder.  No specific injury.  Prior injection helped with her pain for at least a month.  She would like to repeat the injection.  This was completed in clinic today.  No issues.  If she continues to have problems with the right shoulder, we should consider a further workup.  She states her understanding.  Follow-up as needed.  Procedure note injection - Right shoulder    Verbal consent was obtained to inject the right shoulder, subacromial space Timeout was completed to confirm the site of injection.   The skin was prepped with alcohol and ethyl chloride was sprayed at the injection site.  A 21-gauge needle was used to inject 40 mg of Depo-Medrol and 1% lidocaine (3 cc) into the subacromial space of the right shoulder using a posterolateral approach.  There were no complications.  A sterile bandage was applied.    Follow-up: Return if symptoms worsen or fail to improve.  Subjective:  Chief Complaint  Patient presents with   Shoulder Pain    Right shoulder pain     History of Present Illness: [name] is a 67 y.o. female who returns to clinic today for repeat evaluation of her right shoulder.  She describes constant pain in the anterior aspect of the right shoulder.  I previously injected the shoulder, and this improved her symptoms for at least 3-4 weeks.  She states the pain is progressively returned.  Starting next month, she will be taking care of her grandchildren on a regular basis.  She would like a repeat injection today.  Review of Systems: No fevers or chills No numbness or tingling No chest pain No shortness of breath No bowel or bladder dysfunction No GI distress No headaches   Objective: BP 123/77   Pulse 63   Ht 5'  2" (1.575 m)   Wt 148 lb (67.1 kg)   LMP 03/16/2010 (Approximate)   BMI 27.07 kg/m   Physical Exam:  General: Alert and oriented. and No acute distress. Gait: Normal gait.  Right shoulder without deformity.  No atrophy is appreciated.  Tenderness palpation over the anterior shoulder.  Good range of motion.  Good strength.  Fingers are warm and well-perfused.  Pain and can testing position.  IMAGING: I personally reviewed images previously obtained in clinic  No new imaging obtained today.  New Medications:  Meds ordered this encounter  Medications   methylPREDNISolone acetate (DEPO-MEDROL) injection 40 mg      Oliver Barre, MD  07/11/2022 5:54 PM

## 2022-07-11 NOTE — Patient Instructions (Signed)

## 2022-07-25 ENCOUNTER — Encounter: Payer: BC Managed Care – PPO | Admitting: Family Medicine

## 2022-07-25 ENCOUNTER — Ambulatory Visit: Payer: Medicare Other | Admitting: Family Medicine

## 2022-07-25 ENCOUNTER — Encounter: Payer: Self-pay | Admitting: Family Medicine

## 2022-07-25 VITALS — BP 118/80 | HR 64 | Temp 98.0°F | Ht 62.0 in | Wt 144.0 lb

## 2022-07-25 DIAGNOSIS — E7800 Pure hypercholesterolemia, unspecified: Secondary | ICD-10-CM

## 2022-07-25 DIAGNOSIS — M8588 Other specified disorders of bone density and structure, other site: Secondary | ICD-10-CM

## 2022-07-25 DIAGNOSIS — Z5181 Encounter for therapeutic drug level monitoring: Secondary | ICD-10-CM | POA: Diagnosis not present

## 2022-07-25 DIAGNOSIS — I83812 Varicose veins of left lower extremities with pain: Secondary | ICD-10-CM

## 2022-07-25 DIAGNOSIS — Z0001 Encounter for general adult medical examination with abnormal findings: Secondary | ICD-10-CM

## 2022-07-25 DIAGNOSIS — Z79899 Other long term (current) drug therapy: Secondary | ICD-10-CM

## 2022-07-25 DIAGNOSIS — A6000 Herpesviral infection of urogenital system, unspecified: Secondary | ICD-10-CM

## 2022-07-25 DIAGNOSIS — Z0000 Encounter for general adult medical examination without abnormal findings: Secondary | ICD-10-CM

## 2022-07-25 DIAGNOSIS — G5761 Lesion of plantar nerve, right lower limb: Secondary | ICD-10-CM | POA: Diagnosis not present

## 2022-07-25 DIAGNOSIS — K2100 Gastro-esophageal reflux disease with esophagitis, without bleeding: Secondary | ICD-10-CM | POA: Diagnosis not present

## 2022-07-25 DIAGNOSIS — R252 Cramp and spasm: Secondary | ICD-10-CM | POA: Diagnosis not present

## 2022-07-25 DIAGNOSIS — F3130 Bipolar disorder, current episode depressed, mild or moderate severity, unspecified: Secondary | ICD-10-CM | POA: Diagnosis not present

## 2022-07-25 DIAGNOSIS — R6889 Other general symptoms and signs: Secondary | ICD-10-CM | POA: Diagnosis not present

## 2022-07-25 LAB — URINALYSIS
Bilirubin, UA: NEGATIVE
Glucose, UA: NEGATIVE
Ketones, UA: NEGATIVE
Nitrite, UA: NEGATIVE
Protein,UA: NEGATIVE
RBC, UA: NEGATIVE
Specific Gravity, UA: 1.02 (ref 1.005–1.030)
Urobilinogen, Ur: 0.2 mg/dL (ref 0.2–1.0)
pH, UA: 7 (ref 5.0–7.5)

## 2022-07-25 LAB — CMP14+EGFR

## 2022-07-25 LAB — CBC
Hematocrit: 39.6 % (ref 34.0–46.6)
MCV: 87 fL (ref 79–97)
WBC: 6.3 10*3/uL (ref 3.4–10.8)

## 2022-07-25 LAB — IRON,TIBC AND FERRITIN PANEL

## 2022-07-25 LAB — LIPID PANEL

## 2022-07-25 LAB — PTH, INTACT AND CALCIUM

## 2022-07-25 LAB — TSH

## 2022-07-25 MED ORDER — IBUPROFEN 800 MG PO TABS: 800.0000 mg | ORAL_TABLET | Freq: Three times a day (TID) | ORAL | 3 refills | Status: DC | PRN

## 2022-07-25 MED ORDER — LITHIUM CARBONATE 300 MG PO CAPS
ORAL_CAPSULE | ORAL | 1 refills | Status: DC
Start: 2022-07-25 — End: 2023-02-01

## 2022-07-25 MED ORDER — VALACYCLOVIR HCL 500 MG PO TABS: ORAL_TABLET | ORAL | 3 refills | Status: DC

## 2022-07-25 MED ORDER — OMEPRAZOLE 20 MG PO CPDR: 20.0000 mg | DELAYED_RELEASE_CAPSULE | Freq: Every day | ORAL | 3 refills | Status: DC

## 2022-07-25 NOTE — Progress Notes (Signed)
[name] is a 67 y.o. female presents to office today for annual physical exam examination.    Concerns today include: 1.  Left calf pain Patient reports intermittent left-sided calf pain just at the area of where her varicose vein is.  This is not constant but she wanted to make note of it.  She is established with vein and vascular in [place] but has not contacted them about this particular issue as her right leg was previously evaluated by them.  She notes that cramping is present to at nighttime.  This been ongoing for a while now.  She sometimes takes mustard in efforts to alleviate or eats a banana.  2.  Right foot pain Patient reports some pain between the first and second digits of the right foot on the plantar surface.  This is intermittent.  Sometimes it is a neuropathic type pain.  No treatment so far  3.  Chronic right shoulder pain She is status post corticosteroid injection in the right shoulder.  So far this seems to be helping.  They discussed potential further evaluation for rotator cuff issues if recurs.  Occupation: retired  Diet: high fat/ high carb, Exercise: no longer walking since taking care of grandkids. Refills needed today:  Immunizations needed: PNA 20 Immunization History  Administered Date(s) Administered   Influenza Whole 11/15/2009   Influenza,inj,Quad PF,6+ Mos 10/08/2014, 11/01/2018   Influenza-Unspecified 10/09/2013, 10/12/2015, 10/10/2017, 10/27/2021   PFIZER(Purple Top)SARS-COV-2 Vaccination 09/18/2019, 10/16/2019   Pneumococcal Conjugate-13 08/13/2013   Pneumococcal Polysaccharide-23 06/15/2004   Td 01/16/2008   Tdap 07/16/2014   Zoster Recombinant(Shingrix) 10/17/2016, 02/13/2017   Zoster, Live 07/27/2011     Past Medical History:  Diagnosis Date   Atypical mole 06/24/1997   center back-moderate   Atypical mole 06/18/2005   left arm,right upper back-slight   Atypical mole 06/18/2005   lower mid back-moderate tx-widershave    Atypical mole 06/17/2006   right lower abdomen-features of a deep nevus   Atypical mole 06/25/2016   left thigh crease-mild   Atypical mole 06/25/2016   mid chest-moderate   Atypical mole 11/28/2017   left neck, right chest-moderate   Benign neoplasm of colon 04/2008   tubular adenoma   Bipolar I disorder, most recent episode (or current) depressed    Headache(784.0)    Other and unspecified hyperlipidemia    Other atopic dermatitis and related conditions    Squamous cell carcinoma of skin 12/14/2016   mid left back   STD (sexually transmitted disease) 1989   HSV   Social History   Socioeconomic History   Marital status: Widowed    Spouse name: Not on file   Number of children: 2   Years of education: Not on file   Highest education level: Not on file  Occupational History   Occupation: TEACHER ASSISTANT    Employer: [employer]  Tobacco Use   Smoking status: Never   Smokeless tobacco: Never  Vaping Use   Vaping Use: Never used  Substance and Sexual Activity   Alcohol use: No   Drug use: No   Sexual activity: Not Currently    Partners: Male    Birth control/protection: Post-menopausal  Other Topics Concern   Not on file  Social History Narrative   Patient is a retired Geophysicist/field seismologist for [employer], retired 2019.   She has a granddaughter, whom she cares for during the week.   Social Determinants of Health   Financial Resource Strain: Not on file  Food Insecurity: Not on file  Transportation Needs: Not on file  Physical Activity: Not on file  Stress: Not on file  Social Connections: Not on file  Intimate Partner Violence: Not on file   Past Surgical History:  Procedure Laterality Date   CESAREAN SECTION     x 2   WISDOM TOOTH EXTRACTION  age 32   Family History  Problem Relation Age of Onset   Heart disease Father    Hypertension Father    Thyroid disease Mother    Osteoporosis Mother    Colon cancer Maternal Grandmother 80   Breast  cancer Neg Hx     Current Outpatient Medications:    Calcium Citrate-Vitamin D (CALCIUM + D PO), Take by mouth., Disp: , Rfl:    Ferrous Sulfate (IRON PO), Take by mouth., Disp: , Rfl:    ibuprofen (IBU) 800 MG tablet, Take 1 tablet (800 mg total) by mouth every 8 (eight) hours as needed., Disp: 90 tablet, Rfl: 3   lithium carbonate 300 MG capsule, TAKE ONE CAPSULE THREE TIMES DAILY WITH MEALS, Disp: 270 capsule, Rfl: 0   omeprazole (PRILOSEC) 20 MG capsule, Take 1 capsule (20 mg total) by mouth daily., Disp: 90 capsule, Rfl: 3   valACYclovir (VALTREX) 500 MG tablet, TAKE ONE TABLET DAILY. (TWICE DAILY X3 DAYS FOR OUTBREAKS), Disp: 90 tablet, Rfl: 3  Allergies  Allergen Reactions   Penicillins Rash     ROS: Review of Systems Pertinent items noted in HPI and remainder of comprehensive ROS otherwise negative.    Physical exam BP 118/80   Pulse 64   Temp 98 F (36.7 C) (Temporal)   Ht 5\' 2"  (1.575 m)   Wt 144 lb (65.3 kg)   LMP 03/16/2010 (Approximate)   SpO2 99%   BMI 26.34 kg/m  General appearance: alert, cooperative, appears stated age, and no distress Head: Normocephalic, without obvious abnormality, atraumatic Eyes: negative findings: lids and lashes normal and conjunctivae and sclerae normal Ears: normal TM's and external ear canals both ears Nose: Nares normal. Septum midline. Mucosa normal. No drainage or sinus tenderness. Throat: lips, mucosa, and tongue normal; teeth and gums normal Neck: no adenopathy, no carotid bruit, supple, symmetrical, trachea midline, and thyroid not enlarged, symmetric, no tenderness/mass/nodules Back:  Increased kyphosis of thoracic spine with slight rotation of the thoracic spine to the right creating a mild rib hump Lungs: clear to auscultation bilaterally Heart: regular rate and rhythm, S1, S2 normal, no murmur, click, rub or gallop Abdomen: soft, non-tender; bowel sounds normal; no masses,  no organomegaly Extremities:  Warm,  well-perfused.  No edema.  She has varicose veins throughout bilateral lower extremities but does have a cluster of varicose veins that seem to be more prominent on the posterior left calf and the right upper thigh anteriorly Pulses: 2+ and symmetric Skin:  varicose veins as above Lymph nodes: Cervical, supraclavicular, and axillary nodes normal. Neurologic: Grossly normal GU: deferred to GYN visit in Dec.     07/25/2022    7:58 AM 01/12/2022    8:08 AM 07/11/2021    9:25 AM  Depression screen PHQ 2/9  Decreased Interest 0 0 0  Down, Depressed, Hopeless 0 0 0  PHQ - 2 Score 0 0 0  Altered sleeping 0 1 2  Tired, decreased energy 0 0 0  Change in appetite 0 0 0  Feeling bad or failure about yourself  0 0 0  Trouble concentrating 0 0 0  Moving slowly or fidgety/restless 0  0 0  Suicidal thoughts 0 0 0  PHQ-9 Score 0 1 2  Difficult doing work/chores Not difficult at all Not difficult at all Not difficult at all      07/25/2022    7:58 AM 01/12/2022    8:08 AM 07/11/2021    9:26 AM 01/10/2021    8:30 AM  GAD 7 : Generalized Anxiety Score  Nervous, Anxious, on Edge 0 0 0 0  Control/stop worrying 0 0 0 0  Worry too much - different things 0 0 0 0  Trouble relaxing 0 0  0  Restless 0 0 0 0  Easily annoyed or irritable 0 0 0 0  Afraid - awful might happen 0 0 0 0  Total GAD 7 Score 0 0  0  Anxiety Difficulty Not difficult at all Not difficult at all Not difficult at all Not difficult at all     Assessment/ Plan: [name] here for annual physical exam.   Annual physical exam  Bipolar I disorder, most recent episode (or current) depressed (HCC) - Plan: TSH, PTH, Intact and Calcium, CMP14+EGFR, Urinalysis, Lithium level, CBC, lithium carbonate 300 MG capsule  Encounter for lithium monitoring - Plan: TSH, PTH, Intact and Calcium, CMP14+EGFR, Urinalysis, Lithium level, CBC  Pure hypercholesterolemia - Plan: Lipid Panel, CMP14+EGFR  Osteopenia of lumbar spine - Plan: CBC, DG  [facility] DEXA  Gastroesophageal reflux disease with esophagitis without hemorrhage - Plan: omeprazole (PRILOSEC) 20 MG capsule  Herpes simplex infection of genitourinary system - Plan: valACYclovir (VALTREX) 500 MG tablet  Calf cramp - Plan: Iron, TIBC and Ferritin Panel  Morton's neuralgia, right  Varicose veins of leg with pain, left  Check lithium level.  Last dose was approximately 11 and half hours ago.  Check TSH, PTH, calcium, CMP and urine dip.  Lithium renewed as mood is stable.  Fasting lipid panel also collected today  Future order for DEXA scan placed as x-ray was not available today  GERD is chronic and stable.  PPI renewed  Valtrex renewed.  I wonder if the calf cramping is related to the varicose vein.  I did encourage her to follow-up with vascular to further evaluate.  In the interim I will look for electrolyte abnormality with potassium check, iron check.  The concerning pain in the right foot seems to be consistent with Morton's neuralgia and I encouraged metatarsal pad to assist with this.  Counseled on healthy lifestyle choices, including diet (rich in fruits, vegetables and lean meats and low in salt and simple carbohydrates) and exercise (at least 30 minutes of moderate physical activity daily).  Patient to follow up in 58m for lithium monitoring  [name] M. Nadine Counts, DO

## 2022-07-25 NOTE — Patient Instructions (Signed)
Let me know if you need referral for vascular for that varicose vein.  Morton Neuralgia  Morton neuralgia is foot pain that affects the ball of the foot and the area near the toes. Morton neuralgia occurs when part of a nerve in the foot (digital nerve) is under too much pressure (compressed). When this happens over a long period of time, the nerve can thicken (neuroma) and cause pain. Pain usually occurs between the third and fourth toes.  Morton neuralgia can come and go but may get worse over time. What are the causes? This condition is caused by doing the same things over and over with your foot, such as: Activities such as running or jumping. Wearing shoes that are too tight. What increases the risk? You may be at higher risk for Morton neuralgia if you: Are female. Wear high heels. Wear shoes that are narrow or tight. Do activities that repeatedly stretch your toes, such as: Running. Ballet. Long-distance walking. What are the signs or symptoms? The first symptom of Morton neuralgia is pain that spreads from the ball of the foot to the toes. It may feel like you are walking on a marble. Pain usually gets worse with walking and goes away at night. Other symptoms may include numbness and cramping of your toes. Both feet are equally affected, but rarely at the same time. How is this diagnosed? This condition is diagnosed based on your symptoms, your medical history, and a physical exam. Your health care provider may: Squeeze your foot just behind your toe. Ask you to move your toes to check for pain. Ask about your physical activity level. You also may have imaging tests, such as an X-ray, ultrasound, or MRI. How is this treated? Treatment depends on how severe your condition is and what causes it. Treatment may involve: Wearing different shoes that are not too tight, are low-heeled, and provide good support. For some people, this is the only treatment needed. Wearing an  over-the-counter or custom supportive pad (orthotic) under the front of your foot. Getting injections of numbing medicine and anti-inflammatory medicine (steroid) in the nerve. Having surgery to remove part of the thickened nerve. Follow these instructions at home: Managing pain, stiffness, and swelling  Massage your foot as needed. Wear orthotics as told by your health care provider. If directed, put ice on the painful area. To do this: Put ice in a plastic bag. Place a towel between your skin and the bag. Leave the ice on for 20 minutes, 2-3 times a day. Remove the ice if your skin turns bright red. This is very important. If you cannot feel pain, heat, or cold, you have a greater risk of damage to the area. Raise (elevate) the injured area above the level of your heart while you are sitting or lying down. Avoid activities that cause pain or make pain worse. If you play sports, ask your health care provider when it is safe for you to return to sports. General instructions Take over-the-counter and prescription medicines only as told by your health care provider. For the time period you were told by your health care provider, do not drive or use machinery. Wear shoes that: Have soft soles. Have a wide toe area. Provide arch support. Do not pinch or squeeze your feet. Have room for your orthotics, if this applies. Keep all follow-up visits. This is important. Contact a health care provider if: Your symptoms get worse or do not get better with treatment and home care. Summary  Morton neuralgia is foot pain that affects the ball of the foot and the area near the toes. Pain usually occurs between the third and fourth toes, gets worse with walking, and goes away at night. Morton neuralgia occurs when part of a nerve in the foot (digital nerve) is under too much pressure. When this happens over a long period of time, the nerve can thicken (neuroma) and cause pain. This condition is caused by  doing the same things over and over with your foot, such as running or jumping, wearing shoes that are too tight, or wearing high heels. Treatment may involve wearing low-heeled shoes that are not too tight, wearing a supportive pad (orthotic) under the front of your foot, getting injections in the nerve, or having surgery to remove part of the thickened nerve. This information is not intended to replace advice given to you by your health care provider. Make sure you discuss any questions you have with your health care provider. Document Revised: 06/10/2020 Document Reviewed: 06/10/2020 Elsevier Patient Education  2024 [employer].

## 2022-07-26 LAB — IRON,TIBC AND FERRITIN PANEL
Ferritin: 51 ng/mL (ref 15–150)
Total Iron Binding Capacity: 356 ug/dL (ref 250–450)
UIBC: 237 ug/dL (ref 118–369)

## 2022-07-26 LAB — LIPID PANEL
HDL: 50 mg/dL (ref >39)
Triglycerides: 166 mg/dL — ABNORMAL HIGH (ref 0–149)
VLDL Cholesterol Cal: 29 mg/dL (ref 5–40)

## 2022-07-26 LAB — CBC
Hemoglobin: 12.9 g/dL (ref 11.1–15.9)
MCH: 28.4 pg (ref 26.6–33.0)
MCHC: 32.6 g/dL (ref 31.5–35.7)
Platelets: 235 10*3/uL (ref 150–450)
RBC: 4.54 x10E6/uL (ref 3.77–5.28)
RDW: 12.9 % (ref 11.7–15.4)

## 2022-07-26 LAB — CMP14+EGFR
ALT: 12 IU/L (ref 0–32)
AST: 15 IU/L (ref 0–40)
Alkaline Phosphatase: 73 IU/L (ref 44–121)
Creatinine, Ser: 0.77 mg/dL (ref 0.57–1.00)
Globulin, Total: 2.3 g/dL (ref 1.5–4.5)
Potassium: 4.2 mmol/L (ref 3.5–5.2)
Total Protein: 6.5 g/dL (ref 6.0–8.5)
eGFR: 86 mL/min/{1.73_m2} (ref >59)

## 2022-07-26 LAB — LITHIUM LEVEL: Lithium Lvl: 0.9 mmol/L (ref 0.5–1.2)

## 2022-07-26 LAB — PTH, INTACT AND CALCIUM: PTH: 40 pg/mL (ref 15–65)

## 2022-08-03 ENCOUNTER — Telehealth: Payer: Self-pay | Admitting: Family Medicine

## 2022-08-03 DIAGNOSIS — R252 Cramp and spasm: Secondary | ICD-10-CM

## 2022-08-03 DIAGNOSIS — I83812 Varicose veins of left lower extremities with pain: Secondary | ICD-10-CM

## 2022-08-03 NOTE — Telephone Encounter (Signed)
REFERRAL REQUEST Telephone Note  Have you been seen at our office for this problem? [facility] (Advise that they may need an appointment with their PCP before a referral can be done)  Reason for Referral: left leg vein hurts and cramps Referral discussed with patient: yes at last ov  Best contact number of patient for referral team: [phone]    Has patient been seen by a specialist for this issue before: no  Patient provider preference for referral: [facility] Patient location preference for referral: [facility]  Pt says that she discussed this with DR Nadine Counts and wants to go ahead with referral   Patient notified that referrals can take up to a week or longer to process. If they haven't heard anything within a week they should call back and speak with the referral department.

## 2022-08-17 DIAGNOSIS — L821 Other seborrheic keratosis: Secondary | ICD-10-CM | POA: Diagnosis not present

## 2022-08-17 DIAGNOSIS — Z09 Encounter for follow-up examination after completed treatment for conditions other than malignant neoplasm: Secondary | ICD-10-CM | POA: Diagnosis not present

## 2022-08-17 DIAGNOSIS — L814 Other melanin hyperpigmentation: Secondary | ICD-10-CM | POA: Diagnosis not present

## 2022-08-17 DIAGNOSIS — I8392 Asymptomatic varicose veins of left lower extremity: Secondary | ICD-10-CM | POA: Diagnosis not present

## 2022-08-17 DIAGNOSIS — L718 Other rosacea: Secondary | ICD-10-CM | POA: Diagnosis not present

## 2022-08-17 DIAGNOSIS — D225 Melanocytic nevi of trunk: Secondary | ICD-10-CM | POA: Diagnosis not present

## 2022-08-17 DIAGNOSIS — L578 Other skin changes due to chronic exposure to nonionizing radiation: Secondary | ICD-10-CM | POA: Diagnosis not present

## 2022-09-03 ENCOUNTER — Ambulatory Visit (INDEPENDENT_AMBULATORY_CARE_PROVIDER_SITE_OTHER): Payer: Medicare Other

## 2022-09-03 DIAGNOSIS — M8588 Other specified disorders of bone density and structure, other site: Secondary | ICD-10-CM

## 2022-09-03 DIAGNOSIS — Z780 Asymptomatic menopausal state: Secondary | ICD-10-CM | POA: Diagnosis not present

## 2022-09-03 DIAGNOSIS — M8589 Other specified disorders of bone density and structure, multiple sites: Secondary | ICD-10-CM | POA: Diagnosis not present

## 2022-09-03 DIAGNOSIS — Z23 Encounter for immunization: Secondary | ICD-10-CM | POA: Diagnosis not present

## 2022-09-03 NOTE — Progress Notes (Signed)
Prevnar 20 given left deltoid - patient tolerated well

## 2022-10-04 ENCOUNTER — Other Ambulatory Visit: Payer: Self-pay | Admitting: *Deleted

## 2022-10-04 DIAGNOSIS — I83812 Varicose veins of left lower extremities with pain: Secondary | ICD-10-CM

## 2022-10-05 ENCOUNTER — Other Ambulatory Visit: Payer: Self-pay | Admitting: Nurse Practitioner

## 2022-10-05 DIAGNOSIS — Z1231 Encounter for screening mammogram for malignant neoplasm of breast: Secondary | ICD-10-CM

## 2022-10-08 ENCOUNTER — Encounter (HOSPITAL_COMMUNITY): Payer: Medicare Other

## 2022-10-15 ENCOUNTER — Ambulatory Visit (HOSPITAL_COMMUNITY)
Admission: RE | Admit: 2022-10-15 | Discharge: 2022-10-15 | Disposition: A | Discharge status: Home / Self Care | Payer: Medicare Other | Source: Ambulatory Visit | Attending: Vascular Surgery | Admitting: Vascular Surgery

## 2022-10-15 ENCOUNTER — Ambulatory Visit: Payer: Medicare Other | Admitting: Physician Assistant

## 2022-10-15 ENCOUNTER — Encounter: Payer: Self-pay | Admitting: Physician Assistant

## 2022-10-15 VITALS — BP 125/77 | HR 77 | Temp 97.5°F | Resp 18 | Ht 65.0 in | Wt 145.2 lb

## 2022-10-15 DIAGNOSIS — I83812 Varicose veins of left lower extremities with pain: Secondary | ICD-10-CM | POA: Insufficient documentation

## 2022-10-15 NOTE — Progress Notes (Signed)
Requested by:  Raliegh Ip, DO [address] [place],  [place] [address]  Reason for consultation: left leg veins hurting and cramps    History of Present Illness   [name] is a 67 y.o. ([birth year]) female who presents for evaluation of left leg veins with pain and cramping. She explains that in 2019 she underwent venous ablation at [facility]. She has had improvement of her symptoms in her leg but recently she started having intermittent throbbing pains in the small veins on the back of her left calf. She says it does not bother her all the time but it is aggravating. She also reports frequent cramps in her legs at night time. She says she gets up and takes a spoon full of mustard and this helps. She does not regularly elevate. She does wear knee high or thigh high compression stockings intermittently. She reports that she cares for her two young grand kids and so depending on her schedule she may or may not remember to put her stockings on. She otherwise denies any pain in her legs, tiredness, burning, itching, stinging, swelling, bleeding or ulceration.   Venous symptoms include: throbbing Onset/duration:  < 1 year  Occupation:  retired Aggravating factors: none Alleviating factors: rarely Compression:  yes Helps:  yes Pain medications:  none Previous vein procedures: ablation LLE 2019 History of DVT:   No  Past Medical History:  Diagnosis Date   Atypical mole 06/24/1997   center back-moderate   Atypical mole 06/18/2005   left arm,right upper back-slight   Atypical mole 06/18/2005   lower mid back-moderate tx-widershave   Atypical mole 06/17/2006   right lower abdomen-features of a deep nevus   Atypical mole 06/25/2016   left thigh crease-mild   Atypical mole 06/25/2016   mid chest-moderate   Atypical mole 11/28/2017   left neck, right chest-moderate   Benign neoplasm of colon 04/2008   tubular adenoma   Bipolar I disorder, most recent episode (or current) depressed     Headache(784.0)    Other and unspecified hyperlipidemia    Other atopic dermatitis and related conditions    Squamous cell carcinoma of skin 12/14/2016   mid left back   STD (sexually transmitted disease) 1989   HSV    Past Surgical History:  Procedure Laterality Date   CESAREAN SECTION     x 2   WISDOM TOOTH EXTRACTION  age 37    Social History   Socioeconomic History   Marital status: Widowed    Spouse name: Not on file   Number of children: 2   Years of education: Not on file   Highest education level: Not on file  Occupational History   Occupation: TEACHER ASSISTANT    Employer: [employer]  Tobacco Use   Smoking status: Never   Smokeless tobacco: Never  Vaping Use   Vaping status: Never Used  Substance and Sexual Activity   Alcohol use: No   Drug use: No   Sexual activity: Not Currently    Partners: Male    Birth control/protection: Post-menopausal  Other Topics Concern   Not on file  Social History Narrative   Patient is a retired Geophysicist/field seismologist for [employer], retired 2019.   She has a granddaughter, whom she cares for during the week.   Social Determinants of Health   Financial Resource Strain: Not on file  Food Insecurity: No Food Insecurity (09/15/2021)   Received from [facility], [facility]  Hunger Vital Sign    Worried About Programme researcher, broadcasting/film/video in the Last Year: Never true    Ran Out of Food in the Last Year: Never true  Transportation Needs: Not on file  Physical Activity: Not on file  Stress: Not on file  Social Connections: Unknown (05/28/2021)   Received from [facility], [facility]   Social Network    Social Network: Not on file  Intimate Partner Violence: Unknown (04/19/2021)   Received from [facility], [facility]   HITS    Physically Hurt: Not on file    Insult or Talk Down To: Not on file    Threaten Physical Harm: Not on file    Scream or Curse: Not on file    Family History  Problem Relation  Age of Onset   Hearing loss Mother    Thyroid disease Mother    Osteoporosis Mother    Heart disease Father    Hypertension Father    Failure to thrive Father        ultimate cause of death after several hospitalizations   Colon cancer Maternal Grandmother 35   Breast cancer Neg Hx     Current Outpatient Medications  Medication Sig Dispense Refill   Calcium Citrate-Vitamin D (CALCIUM + D PO) Take by mouth.     Ferrous Sulfate (IRON PO) Take by mouth.     ibuprofen (IBU) 800 MG tablet Take 1 tablet (800 mg total) by mouth every 8 (eight) hours as needed. 90 tablet 3   lithium carbonate 300 MG capsule TAKE ONE CAPSULE THREE TIMES DAILY WITH MEALS 270 capsule 1   omeprazole (PRILOSEC) 20 MG capsule Take 1 capsule (20 mg total) by mouth daily. 90 capsule 3   valACYclovir (VALTREX) 500 MG tablet TAKE ONE TABLET DAILY. (TWICE DAILY X3 DAYS FOR OUTBREAKS) 90 tablet 3   No current facility-administered medications for this visit.    Allergies  Allergen Reactions   Penicillins Rash    REVIEW OF SYSTEMS (negative unless checked):   Cardiac:  []  Chest pain or chest pressure? []  Shortness of breath upon activity? []  Shortness of breath when lying flat? []  Irregular heart rhythm?  Vascular:  []  Pain in calf, thigh, or hip brought on by walking? []  Pain in feet at night that wakes you up from your sleep? []  Blood clot in your veins? [x]  Leg swelling?  Pulmonary:  []  Oxygen at home? []  Productive cough? []  Wheezing?  Neurologic:  []  Sudden weakness in arms or legs? []  Sudden numbness in arms or legs? []  Sudden onset of difficult speaking or slurred speech? []  Temporary loss of vision in one eye? []  Problems with dizziness?  Gastrointestinal:  []  Blood in stool? []  Vomited blood?  Genitourinary:  []  Burning when urinating? []  Blood in urine?  Psychiatric:  []  Major depression  Hematologic:  []  Bleeding problems? []  Problems with blood clotting?  Dermatologic:   []  Rashes or ulcers?  Constitutional:  []  Fever or chills?  Ear/Nose/Throat:  []  Change in hearing? []  Nose bleeds? []  Sore throat?  Musculoskeletal:  []  Back pain? []  Joint pain? []  Muscle pain?   Physical Examination     Vitals:   10/15/22 1246  BP: 125/77  Pulse: 77  Resp: 18  Temp: (!) 97.5 F (36.4 C)  TempSrc: Temporal  SpO2: 97%  Weight: 145 lb 3.2 oz (65.9 kg)  Height: 5\' 5"  (1.651 m)   Body mass index is 24.16 kg/m.  General:  WDWN in  NAD; vital signs documented above Gait: Normal HENT: WNL, normocephalic Pulmonary: normal non-labored breathing , without wheezing Cardiac: regular HR Abdomen: soft Vascular Exam/Pulses:2+ DP and PT pulses bilaterally Extremities: without varicose veins, with reticular veins left calf, right calf, and spider veins right anterior thigh, without edema, without stasis pigmentation, without lipodermatosclerosis, without ulcers Musculoskeletal: no muscle wasting or atrophy  Neurologic: A&O X 3;  No focal weakness or paresthesias are detected Psychiatric:  The pt has Normal affect.  Non-invasive Vascular Imaging   BLE Venous Insufficiency Duplex (10/15/22): LLE: No DVT and SVT No GSV reflux - previous ablation GSV diameter- prior ablation No SSV reflux CFV deep venous reflux AASV with reflux   Medical Decision Making   [name] is a 67 y.o. female who presents with: LLE chronic venous insufficiency with symptomatic reticular veins. She does have one area on the posterior left calf with a cluster of reticular veins that she gets pain in. Otherwise she only has mild symptoms which is mainly cramping in her legs at night. Her Duplex shows no DVT or SVT. He GSV was previously ablated. She has no SSV reflux. She does have reflux in her CFV and an incompetent AASV. Based on the patient's history and examination, I recommend: daily elevation of 20-30 minutes above level of heart, daily compression stocking use, exercise,  weight reduction, refraining from prolonged sitting or standing She is not a candidate for any lazer ablation. We discussed sclerotherapy of her symptomatic reticular veins in left leg. Explained this is considered cosmetic and out of pocket. I had her evaluated with RN [name] who agreed she would be good candidate. Area of symptoms likely treated with one vial of sclerosant. They will arrange scheduling of treatment She can follow up as needed if she has new or worsening symptoms   Lauren Congress, PA-C [facility] Office: [phone]  10/15/2022, 12:53 PM  Clinic MD: [name]

## 2022-11-12 ENCOUNTER — Ambulatory Visit: Payer: Medicare Other

## 2022-11-23 ENCOUNTER — Ambulatory Visit: Payer: Medicare Other

## 2022-11-23 DIAGNOSIS — L570 Actinic keratosis: Secondary | ICD-10-CM | POA: Diagnosis not present

## 2022-11-23 DIAGNOSIS — L718 Other rosacea: Secondary | ICD-10-CM | POA: Diagnosis not present

## 2022-11-23 DIAGNOSIS — L820 Inflamed seborrheic keratosis: Secondary | ICD-10-CM | POA: Diagnosis not present

## 2022-11-23 DIAGNOSIS — L538 Other specified erythematous conditions: Secondary | ICD-10-CM | POA: Diagnosis not present

## 2023-01-07 ENCOUNTER — Ambulatory Visit: Payer: Medicare Other

## 2023-01-07 ENCOUNTER — Ambulatory Visit: Payer: BC Managed Care – PPO | Admitting: Nurse Practitioner

## 2023-01-14 ENCOUNTER — Ambulatory Visit
Admission: RE | Admit: 2023-01-14 | Discharge: 2023-01-14 | Disposition: A | Discharge status: Home / Self Care | Payer: Medicare Other | Source: Ambulatory Visit | Attending: Nurse Practitioner | Admitting: Nurse Practitioner

## 2023-01-14 ENCOUNTER — Other Ambulatory Visit (HOSPITAL_COMMUNITY)
Admission: RE | Admit: 2023-01-14 | Discharge: 2023-01-14 | Disposition: A | Discharge status: Home / Self Care | Payer: Medicare Other | Source: Ambulatory Visit | Attending: Nurse Practitioner | Admitting: Nurse Practitioner

## 2023-01-14 ENCOUNTER — Ambulatory Visit (INDEPENDENT_AMBULATORY_CARE_PROVIDER_SITE_OTHER): Payer: Medicare Other | Admitting: Nurse Practitioner

## 2023-01-14 VITALS — BP 120/70 | HR 72 | Ht 63.0 in | Wt 146.0 lb

## 2023-01-14 DIAGNOSIS — Z780 Asymptomatic menopausal state: Secondary | ICD-10-CM

## 2023-01-14 DIAGNOSIS — Z01411 Encounter for gynecological examination (general) (routine) with abnormal findings: Secondary | ICD-10-CM | POA: Insufficient documentation

## 2023-01-14 DIAGNOSIS — Z9189 Other specified personal risk factors, not elsewhere classified: Secondary | ICD-10-CM | POA: Diagnosis not present

## 2023-01-14 DIAGNOSIS — Z124 Encounter for screening for malignant neoplasm of cervix: Secondary | ICD-10-CM | POA: Insufficient documentation

## 2023-01-14 DIAGNOSIS — R350 Frequency of micturition: Secondary | ICD-10-CM

## 2023-01-14 DIAGNOSIS — M8580 Other specified disorders of bone density and structure, unspecified site: Secondary | ICD-10-CM | POA: Diagnosis not present

## 2023-01-14 DIAGNOSIS — Z1231 Encounter for screening mammogram for malignant neoplasm of breast: Secondary | ICD-10-CM | POA: Diagnosis not present

## 2023-01-14 DIAGNOSIS — Z01419 Encounter for gynecological examination (general) (routine) without abnormal findings: Secondary | ICD-10-CM

## 2023-01-14 DIAGNOSIS — N890 Mild vaginal dysplasia: Secondary | ICD-10-CM

## 2023-01-14 DIAGNOSIS — Z1151 Encounter for screening for human papillomavirus (HPV): Secondary | ICD-10-CM | POA: Insufficient documentation

## 2023-01-14 DIAGNOSIS — A609 Anogenital herpesviral infection, unspecified: Secondary | ICD-10-CM

## 2023-01-14 NOTE — Progress Notes (Signed)
[name] 03-15-1955 [id number]   History:  67 y.o. [id number] presents for breast and pelvic exam. Postmenopausal - no HRT, no bleeding. 12/2021 ASCUS + HR HPV neg 16/18/45, VAIN-1. LGSIL 2021, normal 2022. HLD, GERD, osteopenia managed by PCP. Takes Valtrex daily but would like to take every other day due to costs. Has not had outbreak in 3-4 years.   Gynecologic History Patient's last menstrual period was 03/16/2010 (approximate).   Contraception: post menopausal status Sexually active: Yes  Health Maintenance Last Pap: 01/05/2022. Results were: ASCUS + HR HPV, neg 16/18/45 Last mammogram: 01/14/2023 (today). Results were: No report yet Last colonoscopy: 08/06/2018. Due in 2027 Last Dexa: 2023. Results were: Osteopenia  Past medical history, past surgical history, family history and social history were all reviewed and documented in the EPIC chart. Widowed. In relationship, he lives in [place]. Retired. Son, Designer, multimedia, works for [employer], has 83 yo daughter and 8 mo son. Patient keeps them a few days per week. Daughter local, pregnant with first through IVF, nurse.   ROS:  A ROS was performed and pertinent positives and negatives are included.  Exam:  Vitals:   01/14/23 1351  BP: 120/70  Pulse: 72  SpO2: 99%  Weight: 146 lb (66.2 kg)  Height: 5\' 3"  (1.6 m)   Body mass index is 25.86 kg/m. Physical Exam Constitutional:      Appearance: Normal appearance.  Neck:     Thyroid: No thyroid mass, thyromegaly or thyroid tenderness.  Cardiovascular:     Rate and Rhythm: Normal rate and regular rhythm.  Pulmonary:     Effort: Pulmonary effort is normal.     Breath sounds: Normal breath sounds.  Chest:  Breasts:    Right: Normal.     Left: Normal.  Abdominal:     Palpations: Abdomen is soft.     Tenderness: There is no abdominal tenderness.  Genitourinary:    General: Normal vulva.     Vagina: Normal.     Cervix: Normal.     Uterus: Normal.      Adnexa: Right adnexa normal  and left adnexa normal.     Rectum: Normal.     Comments: Atrophic changes Lymphadenopathy:     Upper Body:     Right upper body: No supraclavicular or axillary adenopathy.     Left upper body: No supraclavicular or axillary adenopathy.     Patient informed chaperone available to be present for breast and pelvic exam. Patient has requested no chaperone to be present. Patient has been advised what will be completed during breast and pelvic exam.   UA neg  Assessment/Plan:  67 y.o. [id number] for breast and pelvic exam.   Encounter for breast and pelvic examination - Education provided on SBEs, importance of preventative screenings, current guidelines, high calcium diet, regular exercise, and multivitamin daily. Labs with PCP.   VAIN I (vaginal intraepithelial neoplasia grade I) - Plan: Cytology - PAP( [facility]).   Postmenopausal - no HRT, no bleeding.   Cervical cancer screening - Plan: Cytology - PAP( [facility]). 12/2021 ASCUS + HR HPV neg 16/18/45, VAIN-1. LGSIL 2021, normal 2022. Pap today per guidelines.   Frequency of urination - Plan: Urinalysis,Complete w/RFL Culture. Neg UA.   HSV (herpes simplex virus) anogenital infection - OK to decrease to every other day or just take as needed. Has not had an outbreak in years. PCP provides prescription.   Screening for breast cancer - Normal mammogram history.  Continue annual screenings.  Normal breast exam today.  Screening for colon cancer - 2020 colonoscopy. Will repeat at 7-year interval per GI's recommendation.   Osteopenia, unspecified location - Managed by PCP. DXA last year per patient.   Return in about 1 year (around 01/14/2024) for B&P (high risk).    Olivia Mackie DNP, 2:28 PM 01/14/2023

## 2023-01-16 LAB — URINE CULTURE
MICRO NUMBER:: 15899877
Result:: NO GROWTH
SPECIMEN QUALITY:: ADEQUATE

## 2023-01-16 LAB — URINALYSIS, COMPLETE W/RFL CULTURE
Bacteria, UA: NONE SEEN /HPF
Bilirubin Urine: NEGATIVE
Casts: NONE SEEN /LPF
Crystals: NONE SEEN /[HPF]
Glucose, UA: NEGATIVE
Hgb urine dipstick: NEGATIVE
Ketones, ur: NEGATIVE
Nitrites, Initial: NEGATIVE
Protein, ur: NEGATIVE
RBC / HPF: NONE SEEN /[HPF] (ref 0–2)
Specific Gravity, Urine: 1.015 (ref 1.001–1.035)
Yeast: NONE SEEN /HPF
pH: 7 (ref 5.0–8.0)

## 2023-01-16 LAB — CULTURE INDICATED

## 2023-01-18 LAB — CYTOLOGY - PAP
Comment: NEGATIVE
Diagnosis: UNDETERMINED — AB
High risk HPV: NEGATIVE

## 2023-01-23 ENCOUNTER — Telehealth: Payer: Self-pay | Admitting: *Deleted

## 2023-01-23 NOTE — Telephone Encounter (Signed)
 Patient calling for 01/14/23 PAP results.    Good morning, Your pap shows atypical cells but is HPV negative. Recommendations are to repeat pap in 1 year. It was great meeting you!   Tiffany  Written by Annabella DELENA Shutter, NP on 01/21/2023  8:00 AM ES  Call returned to patient. Advised as seen above. Pap recall already placed. Questions answered.   Routing to provider for final review. Patient is agreeable to disposition. Will close encounter.

## 2023-01-25 ENCOUNTER — Ambulatory Visit: Payer: Medicare Other | Admitting: Family Medicine

## 2023-02-01 ENCOUNTER — Encounter: Payer: Self-pay | Admitting: Family Medicine

## 2023-02-01 ENCOUNTER — Ambulatory Visit (INDEPENDENT_AMBULATORY_CARE_PROVIDER_SITE_OTHER): Payer: Medicare Other | Admitting: Family Medicine

## 2023-02-01 ENCOUNTER — Encounter: Payer: Self-pay | Admitting: Nurse Practitioner

## 2023-02-01 VITALS — BP 113/70 | HR 62 | Temp 98.5°F | Ht 63.0 in | Wt 143.0 lb

## 2023-02-01 DIAGNOSIS — Z5181 Encounter for therapeutic drug level monitoring: Secondary | ICD-10-CM | POA: Diagnosis not present

## 2023-02-01 DIAGNOSIS — Z79899 Other long term (current) drug therapy: Secondary | ICD-10-CM

## 2023-02-01 DIAGNOSIS — M25562 Pain in left knee: Secondary | ICD-10-CM | POA: Diagnosis not present

## 2023-02-01 DIAGNOSIS — F3130 Bipolar disorder, current episode depressed, mild or moderate severity, unspecified: Secondary | ICD-10-CM

## 2023-02-01 LAB — URINALYSIS
Bilirubin, UA: NEGATIVE
Glucose, UA: NEGATIVE
Ketones, UA: NEGATIVE
Nitrite, UA: NEGATIVE
Protein,UA: NEGATIVE
RBC, UA: NEGATIVE
Specific Gravity, UA: 1.02 (ref 1.005–1.030)
Urobilinogen, Ur: 0.2 mg/dL (ref 0.2–1.0)
pH, UA: 7 (ref 5.0–7.5)

## 2023-02-01 MED ORDER — CELECOXIB 200 MG PO CAPS: 200.0000 mg | ORAL_CAPSULE | Freq: Every day | ORAL | 0 refills | Status: DC | PRN

## 2023-02-01 MED ORDER — LITHIUM CARBONATE 300 MG PO CAPS: ORAL_CAPSULE | ORAL | 1 refills | Status: DC

## 2023-02-01 NOTE — Progress Notes (Signed)
Subjective: CC: 58-month follow-up for mood PCP: Raliegh Ip, DO ZOX:WRUEAVW [name] is a 68 y.o. female presenting to clinic today for:  1.  Bipolar disorder Patient reports stability with lithium 3 times daily.  Reports no concerning signs or symptoms.  Mood is stable.  2.  Left knee pain Patient reports that she may have injured her left knee while dancing over the new year.  She remembers a pop in the left knee but did not have any immediate pain or swelling.  She denies any locking, buckling of the knee.  No weakness.  She feels like maybe it looks a little larger than the right knee but has not appreciate any gross erythema, warmth or swelling.  Occasionally takes an ibuprofen but really has not utilize anything for this.  Sometimes the knee pain radiates up into the left thigh and hip.   ROS: Per HPI  Allergies  Allergen Reactions   Penicillins Rash   Past Medical History:  Diagnosis Date   Atypical mole 06/24/1997   center back-moderate   Atypical mole 06/18/2005   left arm,right upper back-slight   Atypical mole 06/18/2005   lower mid back-moderate tx-widershave   Atypical mole 06/17/2006   right lower abdomen-features of a deep nevus   Atypical mole 06/25/2016   left thigh crease-mild   Atypical mole 06/25/2016   mid chest-moderate   Atypical mole 11/28/2017   left neck, right chest-moderate   Benign neoplasm of colon 04/2008   tubular adenoma   Bipolar I disorder, most recent episode (or current) depressed    Headache(784.0)    Other and unspecified hyperlipidemia    Other atopic dermatitis and related conditions    Squamous cell carcinoma of skin 12/14/2016   mid left back   STD (sexually transmitted disease) 1989   HSV    Current Outpatient Medications:    Calcium Citrate-Vitamin D (CALCIUM + D PO), Take by mouth., Disp: , Rfl:    Ferrous Sulfate (IRON PO), Take by mouth., Disp: , Rfl:    ibuprofen (IBU) 800 MG tablet, Take 1 tablet (800 mg  total) by mouth every 8 (eight) hours as needed., Disp: 90 tablet, Rfl: 3   lithium carbonate 300 MG capsule, TAKE ONE CAPSULE THREE TIMES DAILY WITH MEALS, Disp: 270 capsule, Rfl: 1   omeprazole (PRILOSEC) 20 MG capsule, Take 1 capsule (20 mg total) by mouth daily., Disp: 90 capsule, Rfl: 3   valACYclovir (VALTREX) 500 MG tablet, TAKE ONE TABLET DAILY. (TWICE DAILY X3 DAYS FOR OUTBREAKS), Disp: 90 tablet, Rfl: 3 Social History   Socioeconomic History   Marital status: Widowed    Spouse name: Not on file   Number of children: 2   Years of education: Not on file   Highest education level: Associate degree: occupational, Scientist, product/process development, or vocational program  Occupational History   Occupation: Geologist, engineering    Employer: [employer]rapist, [employer]  Tobacco Use   Smoking status: Never   Smokeless tobacco: Never  Vaping Use   Vaping status: Never Used  Substance and Sexual Activity   Alcohol use: No   Drug use: No   Sexual activity: Not Currently    Partners: Male    Birth control/protection: Post-menopausal  Other Topics Concern   Not on file  Social History Narrative   Patient is a retired Geophysicist/field seismologist for [employer], retired 2019.   She has a granddaughter, whom she cares for during the week.   Social Drivers of Health  Financial Resource Strain: Low Risk  (01/31/2023)   Overall Financial Resource Strain (CARDIA)    Difficulty of Paying Living Expenses: Not hard at all  Food Insecurity: No Food Insecurity (01/31/2023)   Hunger Vital Sign    Worried About Running Out of Food in the Last Year: Never true    Ran Out of Food in the Last Year: Never true  Transportation Needs: No Transportation Needs (01/31/2023)   PRAPARE - Administrator, Civil Service (Medical): No    Lack of Transportation (Non-Medical): No  Physical Activity: Insufficiently Active (01/31/2023)   Exercise Vital Sign    Days of Exercise per Week: 3 days    Minutes of Exercise per Session: 20 min   Stress: No Stress Concern Present (01/31/2023)   [employer] of Occupational Health - Occupational Stress Questionnaire    Feeling of Stress : Not at all  Social Connections: Socially Isolated (01/31/2023)   Social Connection and Isolation Panel [NHANES]    Frequency of Communication with Friends and Family: More than three times a week    Frequency of Social Gatherings with Friends and Family: More than three times a week    Attends Religious Services: Never    Database administrator or Organizations: No    Attends Banker Meetings: Not on file    Marital Status: Widowed  Intimate Partner Violence: Unknown (04/19/2021)   [facility]   HITS    Physically Hurt: Not on file    Insult or Talk Down To: Not on file    Threaten Physical Harm: Not on file    Scream or Curse: Not on file   Family History  Problem Relation Age of Onset   Hearing loss Mother    Thyroid disease Mother    Osteoporosis Mother    Heart disease Father    Hypertension Father    Failure to thrive Father        ultimate cause of death after several hospitalizations   Colon cancer Maternal Grandmother 67   Breast cancer Neg Hx     Objective: Office vital signs reviewed. BP 113/70   Pulse 62   Temp 98.5 F (36.9 C)   Ht 5\' 3"  (1.6 m)   Wt 143 lb (64.9 kg)   LMP 03/16/2010 (Approximate)   SpO2 99%   BMI 25.33 kg/m   Physical Examination:  General: Awake, alert, well nourished, No acute distress HEENT: sclera white, MMM Cardio: regular rate and rhythm, S1S2 heard, no murmurs appreciated Pulm: clear to auscultation bilaterally, no wheezes, rhonchi or rales; normal work of breathing on room air MSK: Left knee with minimal joint effusion.  No erythema, warmth.  No palpable abnormalities.  She is ambulating independently with normal gait and station     02/01/2023    8:25 AM 01/14/2023    1:55 PM 07/25/2022    7:58 AM  Depression screen PHQ 2/9  Decreased  Interest 0 0 0  Down, Depressed, Hopeless 0 0 0  PHQ - 2 Score 0 0 0  Altered sleeping 0  0  Tired, decreased energy 0  0  Change in appetite 0  0  Feeling bad or failure about yourself  0  0  Trouble concentrating 0  0  Moving slowly or fidgety/restless 0  0  Suicidal thoughts 0  0  PHQ-9 Score 0  0  Difficult doing work/chores   Not difficult at all  02/01/2023    8:25 AM 07/25/2022    7:58 AM 01/12/2022    8:08 AM 07/11/2021    9:26 AM  GAD 7 : Generalized Anxiety Score  Nervous, Anxious, on Edge 0 0 0 0  Control/stop worrying 0 0 0 0  Worry too much - different things 0 0 0 0  Trouble relaxing 0 0 0   Restless 0 0 0 0  Easily annoyed or irritable 0 0 0 0  Afraid - awful might happen 0 0 0 0  Total GAD 7 Score 0 0 0   Anxiety Difficulty  Not difficult at all Not difficult at all Not difficult at all     Assessment/ Plan: 68 y.o. female   Bipolar I disorder, most recent episode (or current) depressed (HCC) - Plan: CBC, PTH, Intact and Calcium, TSH, Lithium level, Basic Metabolic Panel, Urinalysis, lithium carbonate 300 MG capsule  Encounter for lithium monitoring - Plan: CBC, PTH, Intact and Calcium, TSH, Lithium level, Basic Metabolic Panel, Urinalysis  Acute pain of left knee - Plan: celecoxib (CELEBREX) 200 MG capsule  Mood disorder is chronic and stable.  Will obtain routine monitoring levels today.  Medication has been renewed for 6 months.  Celebrex daily as needed left knee pain.  Avoid other NSAID medicines.  I considered something like meloxicam but this had a drug interaction with her lithium.   Raliegh Ip, DO [facility] [phone]

## 2023-02-02 LAB — BASIC METABOLIC PANEL
BUN/Creatinine Ratio: 11 — ABNORMAL LOW (ref 12–28)
BUN: 9 mg/dL (ref 8–27)
CO2: 21 mmol/L (ref 20–29)
Calcium: 9.6 mg/dL (ref 8.7–10.3)
Chloride: 106 mmol/L (ref 96–106)
Creatinine, Ser: 0.85 mg/dL (ref 0.57–1.00)
Glucose: 88 mg/dL (ref 70–99)
Potassium: 4.2 mmol/L (ref 3.5–5.2)
Sodium: 140 mmol/L (ref 134–144)
eGFR: 76 mL/min/{1.73_m2} (ref >59)

## 2023-02-02 LAB — CBC
Hematocrit: 41.2 % (ref 34.0–46.6)
Hemoglobin: 13.6 g/dL (ref 11.1–15.9)
MCH: 29.6 pg (ref 26.6–33.0)
MCHC: 33 g/dL (ref 31.5–35.7)
MCV: 90 fL (ref 79–97)
Platelets: 280 10*3/uL (ref 150–450)
RBC: 4.59 x10E6/uL (ref 3.77–5.28)
RDW: 12.1 % (ref 11.7–15.4)
WBC: 4.7 10*3/uL (ref 3.4–10.8)

## 2023-02-02 LAB — PTH, INTACT AND CALCIUM: PTH: 51 pg/mL (ref 15–65)

## 2023-02-02 LAB — LITHIUM LEVEL: Lithium Lvl: 1 mmol/L (ref 0.5–1.2)

## 2023-02-02 LAB — TSH: TSH: 1.05 u[IU]/mL (ref 0.450–4.500)

## 2023-02-04 ENCOUNTER — Encounter: Payer: Self-pay | Admitting: Family Medicine

## 2023-03-08 ENCOUNTER — Encounter: Payer: Medicare Other | Admitting: Family Medicine

## 2023-03-29 DIAGNOSIS — H11042 Peripheral pterygium, stationary, left eye: Secondary | ICD-10-CM | POA: Diagnosis not present

## 2023-03-29 DIAGNOSIS — H04123 Dry eye syndrome of bilateral lacrimal glands: Secondary | ICD-10-CM | POA: Diagnosis not present

## 2023-03-29 DIAGNOSIS — H11153 Pinguecula, bilateral: Secondary | ICD-10-CM | POA: Diagnosis not present

## 2023-03-29 DIAGNOSIS — H2513 Age-related nuclear cataract, bilateral: Secondary | ICD-10-CM | POA: Diagnosis not present

## 2023-03-29 DIAGNOSIS — H35342 Macular cyst, hole, or pseudohole, left eye: Secondary | ICD-10-CM | POA: Diagnosis not present

## 2023-04-12 ENCOUNTER — Encounter (INDEPENDENT_AMBULATORY_CARE_PROVIDER_SITE_OTHER): Admitting: Ophthalmology

## 2023-04-12 DIAGNOSIS — H43813 Vitreous degeneration, bilateral: Secondary | ICD-10-CM | POA: Diagnosis not present

## 2023-04-12 DIAGNOSIS — H35342 Macular cyst, hole, or pseudohole, left eye: Secondary | ICD-10-CM | POA: Diagnosis not present

## 2023-04-12 DIAGNOSIS — H2513 Age-related nuclear cataract, bilateral: Secondary | ICD-10-CM

## 2023-04-18 ENCOUNTER — Encounter (INDEPENDENT_AMBULATORY_CARE_PROVIDER_SITE_OTHER): Admitting: Ophthalmology

## 2023-04-23 ENCOUNTER — Encounter (INDEPENDENT_AMBULATORY_CARE_PROVIDER_SITE_OTHER): Admitting: Ophthalmology

## 2023-04-23 DIAGNOSIS — H35342 Macular cyst, hole, or pseudohole, left eye: Secondary | ICD-10-CM

## 2023-04-23 NOTE — H&P (Signed)
 [name] is an 68 y.o. female.   Chief Complaint:sudden vision less left eye HPI: noted sudden vision loss left eye 5 weeks ago  Past Medical History:  Diagnosis Date   Atypical mole 06/24/1997   center back-moderate   Atypical mole 06/18/2005   left arm,right upper back-slight   Atypical mole 06/18/2005   lower mid back-moderate tx-widershave   Atypical mole 06/17/2006   right lower abdomen-features of a deep nevus   Atypical mole 06/25/2016   left thigh crease-mild   Atypical mole 06/25/2016   mid chest-moderate   Atypical mole 11/28/2017   left neck, right chest-moderate   Benign neoplasm of colon 04/2008   tubular adenoma   Bipolar I disorder, most recent episode (or current) depressed    Headache(784.0)    Other and unspecified hyperlipidemia    Other atopic dermatitis and related conditions    Squamous cell carcinoma of skin 12/14/2016   mid left back   STD (sexually transmitted disease) 1989   HSV    Past Surgical History:  Procedure Laterality Date   CESAREAN SECTION     x 2   WISDOM TOOTH EXTRACTION  age 32    Family History  Problem Relation Age of Onset   Hearing loss Mother    Thyroid disease Mother    Osteoporosis Mother    Heart disease Father    Hypertension Father    Failure to thrive Father        ultimate cause of death after several hospitalizations   Colon cancer Maternal Grandmother 22   Breast cancer Neg Hx    Social History:  reports that she has never smoked. She has never used smokeless tobacco. She reports that she does not drink alcohol and does not use drugs.  Allergies:  Allergies  Allergen Reactions   Penicillins Rash    No medications prior to admission.    Review of systems otherwise negative  Last menstrual period 03/16/2010.  Physical exam: Mental status: oriented x3. Eyes: See eye exam associated with this date of surgery in media tab.  Scanned in by scanning center Ears, Nose, Throat: within normal  limits Neck: Within Normal limits General: within normal limits Chest: Within normal limits Breast: deferred Heart: Within normal limits Abdomen: Within normal limits GU: deferred Extremities: within normal limits Skin: within normal limits  Assessment/Plan Macular hole, left eye Plan: To [facility] for Pars plana vitrectomy,ILM peeling, serum patch, laser, gas injection, left eye   Sherrie George 04/23/2023, 3:06 PM

## 2023-04-30 ENCOUNTER — Ambulatory Visit (INDEPENDENT_AMBULATORY_CARE_PROVIDER_SITE_OTHER): Admitting: Family Medicine

## 2023-04-30 VITALS — BP 115/74 | HR 72 | Ht 63.0 in | Wt 140.0 lb

## 2023-04-30 DIAGNOSIS — E7800 Pure hypercholesterolemia, unspecified: Secondary | ICD-10-CM

## 2023-04-30 DIAGNOSIS — Z79899 Other long term (current) drug therapy: Secondary | ICD-10-CM

## 2023-04-30 DIAGNOSIS — Z5181 Encounter for therapeutic drug level monitoring: Secondary | ICD-10-CM

## 2023-04-30 DIAGNOSIS — Z01818 Encounter for other preprocedural examination: Secondary | ICD-10-CM | POA: Diagnosis not present

## 2023-04-30 NOTE — Progress Notes (Signed)
 Pt is a 68 y.o. female who is here for preoperative clearance for treatment of left macular hole.  She will be seeing Dr. Augustus Ledger for this.  She denies having history of general anesthesia for anything.  She has only had epidurals for C-section.  No allergies except for penicillin.  Denies any chest pain, shortness of breath, lower extremity edema or change in exercise tolerance.  1) High Risk Cardiac Conditions  1) Recent MI - No.  2) Decompensated Heart Failure - No.  3) Unstable angina - No.  4) Symptomatic arrythmia - No.  5) Sx Valvular Disease - No.  2) Intermediate Risk Factors - DM, CKD, CVA, CHF, CAD - No.  2) Functional Status - > 4 mets (Walk, run, climb stairs) Yes.    3) Surgery Specific Risk - Low    4) Further Noninvasive evaluation -   1) EKG - Yes.       2) Echo - No.   1) Worsening dyspnea   3) Stress Testing - Active Cardiac Disease - No.  5) Need for medical therapy - Beta Blocker, Statins indicated ? No.  PE: Blood pressure 115/74, pulse 72, height 5\' 3"  (1.6 m), weight 140 lb (63.5 kg), last menstrual period 03/16/2010, SpO2 98%.  Physical Examination: General appearance - alert, well appearing, and in no distress Mental status - alert, oriented to person, place, and time Eyes - pupils equal and reactive, extraocular eye movements intact Mouth - mucous membranes moist, pharynx normal without lesions and Mallampati 1 Neck - supple, no significant adenopathy, full extension Chest - clear to auscultation, no wheezes, rales or rhonchi, symmetric air entry Heart - normal rate, regular rhythm, normal S1, S2, no murmurs, rubs, clicks or gallops  Preop examination - Plan: EKG 12-Lead, CMP14+EGFR, CBC, CoaguChek XS/INR Waived  Pure hypercholesterolemia - Plan: EKG 12-Lead, CMP14+EGFR, CBC, CoaguChek XS/INR Waived  Encounter for lithium monitoring - Plan: EKG 12-Lead, CMP14+EGFR, CBC, CoaguChek XS/INR Waived  I have independently evaluated patient.  Dejanay B  Dayley is a 68 y.o. female who is low risk for a low risk surgery.  There are not modifiable risk factors (smoking, etc). Charlotte B Rivere's RCRI  calculation for MACE is: 0.    Zahli Vetsch M. Bonnell Butcher, DO [facility]

## 2023-05-01 ENCOUNTER — Telehealth: Payer: Self-pay

## 2023-05-01 NOTE — Telephone Encounter (Signed)
 Copied from CRM [id number]. Topic: General - Other >> May 01, 2023  8:29 AM Baldemar Lev wrote: Reason for CRM: Pt says that she was advised that she has been advised by the eye doctor that they are going to do her blood work at the hospital. Says she cannot come in to the office for labs because she has her grandchildren with her.

## 2023-05-01 NOTE — Telephone Encounter (Signed)
 Per Dr. Bonnell Butcher pt did not stop by lab after her surgical clearance appt on 4/15. Spoke with pt this am. States that she is not sure when she can come back for labs. She is watching her grandchildren today and our office is closed Friday and Monday for Easter Holiday. Pt states that she will try and come by in the morning but not sure if she can. Informed pt that she will need a lab appointment and cannot just walk in. Pt understood and will call back for an appt when she makes a decision on the date and time.  Surgical Clearance form on Kelci's desk. Pt has surgery 05/07/23.

## 2023-05-01 NOTE — Telephone Encounter (Signed)
 Duplicate documentation. Spoke to [place] this morning. LS

## 2023-05-06 ENCOUNTER — Encounter (HOSPITAL_COMMUNITY): Payer: Self-pay | Admitting: Ophthalmology

## 2023-05-06 ENCOUNTER — Other Ambulatory Visit: Payer: Self-pay

## 2023-05-06 NOTE — Progress Notes (Signed)
 PCP - Eliodoro Guerin, DO  Cardiologist -   PPM/ICD - denies Device Orders - n/a Rep Notified - n/a  Chest x-ray - denies EKG - 04-30-23 Stress Test - denies ECHO - denies Cardiac Cath - denies  CPAP - denies  DM -denies  Blood Thinner Instructions: denies Aspirin Instructions: denies  ERAS Protcol - NPO  COVID TEST- n/a  Anesthesia review: yes  Patient verbally denies any shortness of breath, fever, cough and chest pain during phone call   -------------  SDW INSTRUCTIONS given:  Your procedure is scheduled on May 07, 2023.  Report to [facility] Main Entrance "A" at 9:45 A.M., and check in at the Admitting office.  Call this number if you have problems the morning of surgery:  [phone]   Remember:  Do not eat or drink after midnight the night before your surgery      Take these medicines the morning of surgery with A SIP OF WATER   omeprazole  (PRILOSEC)  valACYclovir  (VALTREX )    As of today, STOP taking any Aspirin (unless otherwise instructed by your surgeon) Aleve, Naproxen, Ibuprofen , Motrin , Advil , Goody's, BC's, all herbal medications, fish oil, and all vitamins.                      Do not wear jewelry, make up, or nail polish            Do not wear lotions, powders, perfumes/colognes, or deodorant.            Do not shave 48 hours prior to surgery.  Men may shave face and neck.            Do not bring valuables to the hospital.            [facility] is not responsible for any belongings or valuables.  Do NOT Smoke (Tobacco/Vaping) 24 hours prior to your procedure If you use a CPAP at night, you may bring all equipment for your overnight stay.   Contacts, glasses, dentures or bridgework may not be worn into surgery.      For patients admitted to the hospital, discharge time will be determined by your treatment team.   Patients discharged the day of surgery will not be allowed to drive home, and someone needs to stay with them for 24  hours.    Special instructions:   [facility]- Preparing For Surgery  Before surgery, you can play an important role. Because skin is not sterile, your skin needs to be as free of germs as possible. You can reduce the number of germs on your skin by washing with CHG (chlorahexidine gluconate) Soap before surgery.  CHG is an antiseptic cleaner which kills germs and bonds with the skin to continue killing germs even after washing.    Oral Hygiene is also important to reduce your risk of infection.  Remember - BRUSH YOUR TEETH THE MORNING OF SURGERY WITH YOUR REGULAR TOOTHPASTE  Please do not use if you have an allergy to CHG or antibacterial soaps. If your skin becomes reddened/irritated stop using the CHG.  Do not shave (including legs and underarms) for at least 48 hours prior to first CHG shower. It is OK to shave your face.  Please follow these instructions carefully.   Shower the NIGHT BEFORE SURGERY and the MORNING OF SURGERY with DIAL Soap.   Pat yourself dry with a CLEAN TOWEL.  Wear CLEAN PAJAMAS to bed the night before surgery  Place CLEAN  SHEETS on your bed the night of your first shower and DO NOT SLEEP WITH PETS.   Day of Surgery: Please shower morning of surgery  Wear Clean/Comfortable clothing the morning of surgery Do not apply any deodorants/lotions.   Remember to brush your teeth WITH YOUR REGULAR TOOTHPASTE.   Questions were answered. Patient verbalized understanding of instructions.

## 2023-05-06 NOTE — Anesthesia Preprocedure Evaluation (Signed)
 Anesthesia Evaluation  Patient identified by MRN, date of birth, ID band Patient awake    Reviewed: Allergy & Precautions, NPO status , Patient's Chart, lab work & pertinent test results  Airway Mallampati: IV  TM Distance: >3 FB Neck ROM: Full    Dental  (+) Teeth Intact, Dental Advisory Given   Pulmonary  Snores at night, no sleep study    Pulmonary exam normal breath sounds clear to auscultation       Cardiovascular negative cardio ROS Normal cardiovascular exam Rhythm:Regular Rate:Normal     Neuro/Psych  Headaches PSYCHIATRIC DISORDERS   Bipolar Disorder      GI/Hepatic Neg liver ROS,GERD  Medicated and Controlled,,  Endo/Other  negative endocrine ROS    Renal/GU negative Renal ROS  negative genitourinary   Musculoskeletal negative musculoskeletal ROS (+)    Abdominal   Peds  Hematology negative hematology ROS (+)   Anesthesia Other Findings   Reproductive/Obstetrics negative OB ROS                             Anesthesia Physical Anesthesia Plan  ASA: 2  Anesthesia Plan: General   Post-op Pain Management: Tylenol  PO (pre-op)*   Induction: Intravenous  PONV Risk Score and Plan: 3 and Ondansetron , Dexamethasone , Midazolam  and Treatment may vary due to age or medical condition  Airway Management Planned: Oral ETT  Additional Equipment: None  Intra-op Plan:   Post-operative Plan: Extubation in OR  Informed Consent: I have reviewed the patients History and Physical, chart, labs and discussed the procedure including the risks, benefits and alternatives for the proposed anesthesia with the patient or authorized representative who has indicated his/her understanding and acceptance.     Dental advisory given  Plan Discussed with: CRNA  Anesthesia Plan Comments: (Glidescope available )       Anesthesia Quick Evaluation

## 2023-05-07 ENCOUNTER — Ambulatory Visit (HOSPITAL_BASED_OUTPATIENT_CLINIC_OR_DEPARTMENT_OTHER): Admitting: Anesthesiology

## 2023-05-07 ENCOUNTER — Ambulatory Visit (HOSPITAL_COMMUNITY): Admitting: Anesthesiology

## 2023-05-07 ENCOUNTER — Encounter (HOSPITAL_COMMUNITY)
Admission: RE | Disposition: A | Discharge status: Home / Self Care | Payer: Self-pay | Source: Home / Self Care | Attending: Ophthalmology

## 2023-05-07 ENCOUNTER — Encounter (HOSPITAL_COMMUNITY): Payer: Self-pay | Admitting: Ophthalmology

## 2023-05-07 ENCOUNTER — Ambulatory Visit (HOSPITAL_COMMUNITY)
Admission: RE | Admit: 2023-05-07 | Discharge: 2023-05-08 | Disposition: A | Discharge status: Home / Self Care | Attending: Ophthalmology | Admitting: Ophthalmology

## 2023-05-07 DIAGNOSIS — H35342 Macular cyst, hole, or pseudohole, left eye: Secondary | ICD-10-CM

## 2023-05-07 HISTORY — PX: PARS PLANA VITRECTOMY  FOR MACULAR HOLE WITH LASER AND INTRAOCULAR TAMPONADE: SHX7657

## 2023-05-07 HISTORY — PX: MEMBRANE PEEL: SHX5967

## 2023-05-07 LAB — CBC
HCT: 39.3 % (ref 36.0–46.0)
Hemoglobin: 13.1 g/dL (ref 12.0–15.0)
MCH: 30.3 pg (ref 26.0–34.0)
MCHC: 33.3 g/dL (ref 30.0–36.0)
MCV: 91 fL (ref 80.0–100.0)
Platelets: 239 10*3/uL (ref 150–400)
RBC: 4.32 MIL/uL (ref 3.87–5.11)
RDW: 14.3 % (ref 11.5–15.5)
WBC: 6.3 10*3/uL (ref 4.0–10.5)
nRBC: 0 % (ref 0.0–0.2)

## 2023-05-07 LAB — AUTOLOGOUS SERUM PATCH PREP

## 2023-05-07 SURGERY — PARS PLANA VITRECTOMY  FOR MACULAR HOLE WITH LASER AND INTRAOCULAR TAMPONADE
Anesthesia: General | Laterality: Left

## 2023-05-07 MED ORDER — FENTANYL CITRATE (PF) 250 MCG/5ML IJ SOLN
INTRAMUSCULAR | Status: DC | PRN
  Administered 2023-05-07 (×2): 50 ug via INTRAVENOUS

## 2023-05-07 MED ORDER — PHENYLEPHRINE 80 MCG/ML (10ML) SYRINGE FOR IV PUSH (FOR BLOOD PRESSURE SUPPORT)
PREFILLED_SYRINGE | INTRAVENOUS | Status: DC | PRN
  Administered 2023-05-07: 80 ug via INTRAVENOUS

## 2023-05-07 MED ORDER — TROPICAMIDE 1 % OP SOLN
1.0000 [drp] | OPHTHALMIC | Status: AC | PRN
  Administered 2023-05-07 (×3): 1 [drp] via OPHTHALMIC
  Filled 2023-05-07: qty 15

## 2023-05-07 MED ORDER — SODIUM CHLORIDE 0.45 % IV SOLN: INTRAVENOUS | Status: AC

## 2023-05-07 MED ORDER — BSS PLUS IO SOLN
INTRAOCULAR | Status: DC | PRN
  Administered 2023-05-07: 1 via INTRAOCULAR

## 2023-05-07 MED ORDER — TETRACAINE HCL 0.5 % OP SOLN
2.0000 [drp] | Freq: Once | OPHTHALMIC | Status: DC
  Filled 2023-05-07: qty 4

## 2023-05-07 MED ORDER — OXYCODONE HCL 5 MG/5ML PO SOLN: 5.0000 mg | Freq: Once | ORAL | Status: DC | PRN

## 2023-05-07 MED ORDER — TRIAMCINOLONE ACETONIDE 40 MG/ML IJ SUSP
INTRAMUSCULAR | Status: AC
  Filled 2023-05-07: qty 5

## 2023-05-07 MED ORDER — PANTOPRAZOLE SODIUM 40 MG PO TBEC: 40.0000 mg | DELAYED_RELEASE_TABLET | Freq: Every day | ORAL | Status: DC

## 2023-05-07 MED ORDER — PROPOFOL 10 MG/ML IV BOLUS
INTRAVENOUS | Status: DC | PRN
Start: 2023-05-07 — End: 2023-05-07
  Administered 2023-05-07: 150 mg via INTRAVENOUS

## 2023-05-07 MED ORDER — TEMAZEPAM 7.5 MG PO CAPS
15.0000 mg | ORAL_CAPSULE | Freq: Every evening | ORAL | Status: DC | PRN
  Administered 2023-05-07: 15 mg via ORAL
  Filled 2023-05-07: qty 2

## 2023-05-07 MED ORDER — ONDANSETRON HCL 4 MG/2ML IJ SOLN: 4.0000 mg | Freq: Once | INTRAMUSCULAR | Status: DC | PRN

## 2023-05-07 MED ORDER — ROCURONIUM BROMIDE 10 MG/ML (PF) SYRINGE
PREFILLED_SYRINGE | INTRAVENOUS | Status: DC | PRN
Start: 2023-05-07 — End: 2023-05-07
  Administered 2023-05-07: 50 mg via INTRAVENOUS

## 2023-05-07 MED ORDER — LACTATED RINGERS IV SOLN: INTRAVENOUS | Status: DC

## 2023-05-07 MED ORDER — ATROPINE SULFATE 1 % OP SOLN
OPHTHALMIC | Status: AC
  Filled 2023-05-07: qty 5

## 2023-05-07 MED ORDER — EPINEPHRINE 1 MG/10ML IJ SOSY
PREFILLED_SYRINGE | INTRAMUSCULAR | Status: AC
  Filled 2023-05-07: qty 10

## 2023-05-07 MED ORDER — LIDOCAINE 2% (20 MG/ML) 5 ML SYRINGE
INTRAMUSCULAR | Status: AC
Start: 2023-05-07 — End: ?
  Filled 2023-05-07: qty 5

## 2023-05-07 MED ORDER — ROCURONIUM BROMIDE 10 MG/ML (PF) SYRINGE
PREFILLED_SYRINGE | INTRAVENOUS | Status: AC
  Filled 2023-05-07: qty 10

## 2023-05-07 MED ORDER — CHLORHEXIDINE GLUCONATE 0.12 % MT SOLN
15.0000 mL | Freq: Once | OROMUCOSAL | Status: AC
  Administered 2023-05-07: 15 mL via OROMUCOSAL
  Filled 2023-05-07: qty 15

## 2023-05-07 MED ORDER — DORZOLAMIDE HCL 2 % OP SOLN
1.0000 [drp] | Freq: Three times a day (TID) | OPHTHALMIC | Status: DC
  Filled 2023-05-07: qty 10

## 2023-05-07 MED ORDER — BACITRACIN-POLYMYXIN B 500-10000 UNIT/GM OP OINT
1.0000 | TOPICAL_OINTMENT | Freq: Three times a day (TID) | OPHTHALMIC | Status: DC
  Filled 2023-05-07: qty 3.5

## 2023-05-07 MED ORDER — BRIMONIDINE TARTRATE 0.2 % OP SOLN: 1.0000 [drp] | Freq: Two times a day (BID) | OPHTHALMIC | Status: DC

## 2023-05-07 MED ORDER — PHENYLEPHRINE HCL 2.5 % OP SOLN
1.0000 [drp] | OPHTHALMIC | Status: AC | PRN
  Administered 2023-05-07 (×3): 1 [drp] via OPHTHALMIC
  Filled 2023-05-07: qty 2

## 2023-05-07 MED ORDER — AMISULPRIDE (ANTIEMETIC) 5 MG/2ML IV SOLN: 10.0000 mg | Freq: Once | INTRAVENOUS | Status: DC | PRN

## 2023-05-07 MED ORDER — SODIUM HYALURONATE 10 MG/ML IO SOLUTION
PREFILLED_SYRINGE | INTRAOCULAR | Status: AC
  Filled 2023-05-07: qty 0.85

## 2023-05-07 MED ORDER — SODIUM HYALURONATE 10 MG/ML IO SOLUTION
PREFILLED_SYRINGE | INTRAOCULAR | Status: DC | PRN
  Administered 2023-05-07: .85 mL via INTRAOCULAR

## 2023-05-07 MED ORDER — ACETAZOLAMIDE SODIUM 500 MG IJ SOLR
INTRAMUSCULAR | Status: AC
  Filled 2023-05-07: qty 500

## 2023-05-07 MED ORDER — DEXMEDETOMIDINE HCL IN NACL 80 MCG/20ML IV SOLN
INTRAVENOUS | Status: DC | PRN
  Administered 2023-05-07: 4 ug via INTRAVENOUS
  Administered 2023-05-07: 8 ug via INTRAVENOUS
  Administered 2023-05-07: 4 ug via INTRAVENOUS

## 2023-05-07 MED ORDER — CEFTAZIDIME 1 G IJ SOLR
INTRAMUSCULAR | Status: AC
  Filled 2023-05-07: qty 1

## 2023-05-07 MED ORDER — POLYMYXIN B SULFATE 500000 UNITS IJ SOLR
INTRAMUSCULAR | Status: AC
  Filled 2023-05-07: qty 10

## 2023-05-07 MED ORDER — MAGNESIUM HYDROXIDE 400 MG/5ML PO SUSP: 15.0000 mL | Freq: Four times a day (QID) | ORAL | Status: DC | PRN

## 2023-05-07 MED ORDER — ONDANSETRON HCL 4 MG/2ML IJ SOLN
INTRAMUSCULAR | Status: AC
Start: 2023-05-07 — End: ?
  Filled 2023-05-07: qty 2

## 2023-05-07 MED ORDER — PROPOFOL 10 MG/ML IV BOLUS
INTRAVENOUS | Status: AC
  Filled 2023-05-07: qty 20

## 2023-05-07 MED ORDER — BACITRACIN-POLYMYXIN B 500-10000 UNIT/GM OP OINT
TOPICAL_OINTMENT | OPHTHALMIC | Status: AC
  Filled 2023-05-07: qty 3.5

## 2023-05-07 MED ORDER — PHENYLEPHRINE HCL-NACL 20-0.9 MG/250ML-% IV SOLN
INTRAVENOUS | Status: DC | PRN
  Administered 2023-05-07: 30 ug/min via INTRAVENOUS

## 2023-05-07 MED ORDER — EPINEPHRINE PF 1 MG/ML IJ SOLN
INTRAMUSCULAR | Status: DC | PRN
  Administered 2023-05-07: .3 mg

## 2023-05-07 MED ORDER — CLINDAMYCIN PHOSPHATE 600 MG/50ML IV SOLN
600.0000 mg | Freq: Once | INTRAVENOUS | Status: AC
  Administered 2023-05-07: 600 mg via INTRAVENOUS
  Filled 2023-05-07: qty 50

## 2023-05-07 MED ORDER — STERILE WATER FOR INJECTION IJ SOLN
INTRAMUSCULAR | Status: DC | PRN
  Administered 2023-05-07: 20 mL

## 2023-05-07 MED ORDER — MIDAZOLAM HCL 2 MG/2ML IJ SOLN
INTRAMUSCULAR | Status: DC | PRN
  Administered 2023-05-07: 1 mg via INTRAVENOUS

## 2023-05-07 MED ORDER — PHENYLEPHRINE HCL-NACL 20-0.9 MG/250ML-% IV SOLN
INTRAVENOUS | Status: AC
  Filled 2023-05-07: qty 250

## 2023-05-07 MED ORDER — DORZOLAMIDE HCL-TIMOLOL MAL 2-0.5 % OP SOLN
1.0000 [drp] | Freq: Once | OPHTHALMIC | Status: AC
  Administered 2023-05-07: 1 [drp] via OPHTHALMIC
  Filled 2023-05-07 (×2): qty 10

## 2023-05-07 MED ORDER — HYDROCODONE-ACETAMINOPHEN 5-325 MG PO TABS: 1.0000 | ORAL_TABLET | ORAL | Status: DC | PRN

## 2023-05-07 MED ORDER — OXYCODONE HCL 5 MG PO TABS: 5.0000 mg | ORAL_TABLET | Freq: Once | ORAL | Status: DC | PRN

## 2023-05-07 MED ORDER — BACITRACIN-POLYMYXIN B 500-10000 UNIT/GM OP OINT
TOPICAL_OINTMENT | OPHTHALMIC | Status: DC | PRN
  Administered 2023-05-07: 1 via OPHTHALMIC

## 2023-05-07 MED ORDER — BSS IO SOLN
INTRAOCULAR | Status: AC
  Filled 2023-05-07: qty 15

## 2023-05-07 MED ORDER — BUPIVACAINE HCL (PF) 0.75 % IJ SOLN
INTRAMUSCULAR | Status: DC | PRN
  Administered 2023-05-07: 10 mL

## 2023-05-07 MED ORDER — PHENYLEPHRINE 80 MCG/ML (10ML) SYRINGE FOR IV PUSH (FOR BLOOD PRESSURE SUPPORT)
PREFILLED_SYRINGE | INTRAVENOUS | Status: AC
  Filled 2023-05-07: qty 10

## 2023-05-07 MED ORDER — LIDOCAINE 2% (20 MG/ML) 5 ML SYRINGE
INTRAMUSCULAR | Status: DC | PRN
  Administered 2023-05-07: 60 mg via INTRAVENOUS

## 2023-05-07 MED ORDER — PROCHLORPERAZINE EDISYLATE 10 MG/2ML IJ SOLN
5.0000 mg | Freq: Four times a day (QID) | INTRAMUSCULAR | Status: DC | PRN
  Administered 2023-05-07 – 2023-05-08 (×2): 5 mg via INTRAVENOUS
  Filled 2023-05-07: qty 2

## 2023-05-07 MED ORDER — VALACYCLOVIR HCL 500 MG PO TABS
500.0000 mg | ORAL_TABLET | Freq: Two times a day (BID) | ORAL | Status: DC
Start: 2023-05-07 — End: 2023-05-08
  Filled 2023-05-07 (×2): qty 1

## 2023-05-07 MED ORDER — FENTANYL CITRATE (PF) 250 MCG/5ML IJ SOLN
INTRAMUSCULAR | Status: AC
  Filled 2023-05-07: qty 5

## 2023-05-07 MED ORDER — ACETAZOLAMIDE SODIUM 500 MG IJ SOLR
500.0000 mg | Freq: Once | INTRAMUSCULAR | Status: AC
  Administered 2023-05-08: 500 mg via INTRAVENOUS
  Filled 2023-05-07 (×2): qty 500

## 2023-05-07 MED ORDER — SUGAMMADEX SODIUM 200 MG/2ML IV SOLN
INTRAVENOUS | Status: DC | PRN
  Administered 2023-05-07: 125.2 mg via INTRAVENOUS

## 2023-05-07 MED ORDER — ORAL CARE MOUTH RINSE: 15.0000 mL | Freq: Once | OROMUCOSAL | Status: AC

## 2023-05-07 MED ORDER — CYCLOPENTOLATE HCL 1 % OP SOLN
1.0000 [drp] | OPHTHALMIC | Status: AC | PRN
  Administered 2023-05-07 (×3): 1 [drp] via OPHTHALMIC
  Filled 2023-05-07: qty 2

## 2023-05-07 MED ORDER — LITHIUM CARBONATE 300 MG PO CAPS
300.0000 mg | ORAL_CAPSULE | Freq: Every day | ORAL | Status: DC
  Filled 2023-05-07: qty 1

## 2023-05-07 MED ORDER — ATROPINE SULFATE 1 % OP SOLN
OPHTHALMIC | Status: DC | PRN
  Administered 2023-05-07: 1 [drp] via OPHTHALMIC

## 2023-05-07 MED ORDER — PROCHLORPERAZINE EDISYLATE 10 MG/2ML IJ SOLN
10.0000 mg | Freq: Four times a day (QID) | INTRAMUSCULAR | Status: DC | PRN
  Filled 2023-05-07: qty 2

## 2023-05-07 MED ORDER — MORPHINE SULFATE (PF) 2 MG/ML IV SOLN: 1.0000 mg | INTRAVENOUS | Status: DC | PRN

## 2023-05-07 MED ORDER — GATIFLOXACIN 0.5 % OP SOLN
1.0000 [drp] | Freq: Four times a day (QID) | OPHTHALMIC | Status: DC
  Filled 2023-05-07: qty 2.5

## 2023-05-07 MED ORDER — BUPIVACAINE HCL (PF) 0.75 % IJ SOLN
INTRAMUSCULAR | Status: AC
  Filled 2023-05-07: qty 10

## 2023-05-07 MED ORDER — ONDANSETRON HCL 4 MG/2ML IJ SOLN
4.0000 mg | Freq: Four times a day (QID) | INTRAMUSCULAR | Status: DC
  Administered 2023-05-07 – 2023-05-08 (×2): 4 mg via INTRAVENOUS
  Filled 2023-05-07 (×2): qty 2

## 2023-05-07 MED ORDER — EPINEPHRINE PF 1 MG/ML IJ SOLN
INTRAMUSCULAR | Status: AC
  Filled 2023-05-07: qty 1

## 2023-05-07 MED ORDER — LATANOPROST 0.005 % OP SOLN
1.0000 [drp] | Freq: Every day | OPHTHALMIC | Status: DC
  Filled 2023-05-07: qty 2.5

## 2023-05-07 MED ORDER — LIDOCAINE HCL 2 % IJ SOLN
INTRAMUSCULAR | Status: AC
  Filled 2023-05-07: qty 20

## 2023-05-07 MED ORDER — FENTANYL CITRATE (PF) 100 MCG/2ML IJ SOLN: 25.0000 ug | INTRAMUSCULAR | Status: DC | PRN

## 2023-05-07 MED ORDER — DEXAMETHASONE SODIUM PHOSPHATE 10 MG/ML IJ SOLN
INTRAMUSCULAR | Status: AC
  Filled 2023-05-07: qty 1

## 2023-05-07 MED ORDER — BSS PLUS IO SOLN
INTRAOCULAR | Status: AC
  Filled 2023-05-07: qty 500

## 2023-05-07 MED ORDER — DEXAMETHASONE SODIUM PHOSPHATE 10 MG/ML IJ SOLN
INTRAMUSCULAR | Status: DC | PRN
  Administered 2023-05-07: 5 mg via INTRAVENOUS

## 2023-05-07 MED ORDER — DEXAMETHASONE SODIUM PHOSPHATE 10 MG/ML IJ SOLN
INTRAMUSCULAR | Status: DC | PRN
  Administered 2023-05-07: 10 mg via INTRAVENOUS

## 2023-05-07 MED ORDER — SODIUM CHLORIDE (PF) 0.9 % IJ SOLN
INTRAMUSCULAR | Status: AC
  Filled 2023-05-07: qty 10

## 2023-05-07 MED ORDER — STERILE WATER FOR INJECTION IJ SOLN
INTRAMUSCULAR | Status: AC
  Filled 2023-05-07: qty 20

## 2023-05-07 MED ORDER — MIDAZOLAM HCL 2 MG/2ML IJ SOLN
INTRAMUSCULAR | Status: AC
  Filled 2023-05-07: qty 2

## 2023-05-07 MED ORDER — BSS IO SOLN
INTRAOCULAR | Status: DC | PRN
  Administered 2023-05-07: 15 mL via INTRAOCULAR

## 2023-05-07 MED ORDER — ACETAMINOPHEN 325 MG PO TABS: 325.0000 mg | ORAL_TABLET | ORAL | Status: DC | PRN

## 2023-05-07 MED ORDER — PREDNISOLONE ACETATE 1 % OP SUSP
1.0000 [drp] | Freq: Four times a day (QID) | OPHTHALMIC | Status: DC
  Filled 2023-05-07: qty 5

## 2023-05-07 MED ORDER — ONDANSETRON HCL 4 MG/2ML IJ SOLN
INTRAMUSCULAR | Status: DC | PRN
  Administered 2023-05-07 (×2): 4 mg via INTRAVENOUS

## 2023-05-07 MED ORDER — GATIFLOXACIN 0.5 % OP SOLN
1.0000 [drp] | OPHTHALMIC | Status: AC | PRN
  Administered 2023-05-07 (×3): 1 [drp] via OPHTHALMIC
  Filled 2023-05-07: qty 2.5

## 2023-05-07 MED ORDER — HEMOSTATIC AGENTS (NO CHARGE) OPTIME
TOPICAL | Status: DC | PRN
  Administered 2023-05-07: 1 via TOPICAL

## 2023-05-07 SURGICAL SUPPLY — 62 items
BAG COUNTER SPONGE SURGICOUNT (BAG) ×1 IMPLANT
BAND WRIST GAS GREEN (MISCELLANEOUS) IMPLANT
BLADE EYE CATARACT 19 1.4 BEAV (BLADE) IMPLANT
BLADE MVR KNIFE 19G (BLADE) IMPLANT
BLADE MVR KNIFE 20G (BLADE) ×1 IMPLANT
BNDG EYE OVAL 2 1/8 X 2 5/8 (GAUZE/BANDAGES/DRESSINGS) IMPLANT
CANNULA VLV SOFT TIP 25G (OPHTHALMIC) ×1 IMPLANT
CANNULA VLV SOFT TIP 25GA (OPHTHALMIC) ×2 IMPLANT
CORD BIPOLAR FORCEPS 12FT (ELECTRODE) ×1 IMPLANT
COTTONBALL LRG STERILE PKG (GAUZE/BANDAGES/DRESSINGS) ×3 IMPLANT
COVER MAYO STAND STRL (DRAPES) IMPLANT
DRAPE INCISE 51X51 W/FILM STRL (DRAPES) IMPLANT
DRAPE OPHTHALMIC 77X100 STRL (CUSTOM PROCEDURE TRAY) ×1 IMPLANT
ERASER HMR WETFIELD 23G BP (MISCELLANEOUS) ×1 IMPLANT
FILTER STRAW FLUID ASPIR (MISCELLANEOUS) ×1 IMPLANT
FORCEPS GRIESHABER ILM 25G A (INSTRUMENTS) IMPLANT
GAS AUTO FILL CONSTELLATION (OPHTHALMIC) ×1 IMPLANT
GLOVE ECLIPSE 8.5 STRL (GLOVE) ×1 IMPLANT
GLOVE SS BIOGEL STRL SZ 6.5 (GLOVE) ×1 IMPLANT
GLOVE SS BIOGEL STRL SZ 7 (GLOVE) ×1 IMPLANT
GLOVE TRIUMPH SURG SIZE 8.5 (KITS) ×1 IMPLANT
GOWN STRL REUS W/ TWL LRG LVL3 (GOWN DISPOSABLE) ×3 IMPLANT
HANDLE PNEUMATIC FOR CONSTEL (OPHTHALMIC) IMPLANT
KIT BASIN OR (CUSTOM PROCEDURE TRAY) ×1 IMPLANT
KNIFE GRIESHABER SHARP 2.5MM (MISCELLANEOUS) IMPLANT
NDL 18GX1X1/2 (RX/OR ONLY) (NEEDLE) ×1 IMPLANT
NDL 25GX 5/8IN NON SAFETY (NEEDLE) ×1 IMPLANT
NDL FILTER BLUNT 18X1 1/2 (NEEDLE) IMPLANT
NDL HYPO 30X.5 LL (NEEDLE) IMPLANT
NEEDLE 18GX1X1/2 (RX/OR ONLY) (NEEDLE) ×1 IMPLANT
NEEDLE 25GX 5/8IN NON SAFETY (NEEDLE) ×1 IMPLANT
NEEDLE FILTER BLUNT 18X1 1/2 (NEEDLE) IMPLANT
NEEDLE HYPO 30X.5 LL (NEEDLE) IMPLANT
NS IRRIG 1000ML POUR BTL (IV SOLUTION) ×1 IMPLANT
PACK FRAGMATOME (OPHTHALMIC) IMPLANT
PACK VITRECTOMY CUSTOM (CUSTOM PROCEDURE TRAY) ×1 IMPLANT
PAD ARMBOARD POSITIONER FOAM (MISCELLANEOUS) ×2 IMPLANT
PAK PIK VITRECTOMY CVS 25GA (OPHTHALMIC) ×1 IMPLANT
PIK ILLUMINATED 25G (OPHTHALMIC) ×1 IMPLANT
PROBE LASER ILLUM FLEX CVD 25G (OPHTHALMIC) IMPLANT
REPL STRA BRUSH NDL (NEEDLE) ×1 IMPLANT
REPL STRA BRUSH NEEDLE (NEEDLE) ×1 IMPLANT
RESERVOIR BACK FLUSH (MISCELLANEOUS) ×1 IMPLANT
ROLLS DENTAL (MISCELLANEOUS) ×2 IMPLANT
SCRAPER DIAMOND 25GA (OPHTHALMIC RELATED) IMPLANT
SCRAPER DIAMOND DUST MEMBRANE (MISCELLANEOUS) ×1 IMPLANT
SHIELD EYE LENSE ONLY DISP (GAUZE/BANDAGES/DRESSINGS) IMPLANT
SPONGE SURGIFOAM ABS GEL 12-7 (HEMOSTASIS) ×1 IMPLANT
STOPCOCK 4 WAY LG BORE MALE ST (IV SETS) IMPLANT
SUT CHROMIC 7 0 TG140 8 (SUTURE) IMPLANT
SUT ETHILON 9 0 TG140 8 (SUTURE) ×1 IMPLANT
SUT POLY NON ABSORB 10-0 8 STR (SUTURE) IMPLANT
SUT SILK 4 0 RB 1 (SUTURE) IMPLANT
SUT VICRYL 7 0 TG140 8 (SUTURE) IMPLANT
SYR 10ML LL (SYRINGE) IMPLANT
SYR 20ML LL LF (SYRINGE) ×1 IMPLANT
SYR 5ML LL (SYRINGE) IMPLANT
SYR BULB EAR ULCER 3OZ GRN STR (SYRINGE) ×1 IMPLANT
SYR TB 1ML LUER SLIP (SYRINGE) ×1 IMPLANT
TUBING HIGH PRESS EXTEN 6IN (TUBING) IMPLANT
WATER STERILE IRR 1000ML POUR (IV SOLUTION) ×1 IMPLANT
WIPE INSTRUMENT VISIWIPE 73X73 (MISCELLANEOUS) IMPLANT

## 2023-05-07 NOTE — Plan of Care (Signed)
  Problem: Pain Managment: Goal: General experience of comfort will improve and/or be controlled Outcome: Progressing   Problem: Safety: Goal: Ability to remain free from injury will improve Outcome: Progressing

## 2023-05-07 NOTE — Transfer of Care (Cosign Needed)
 Immediate Anesthesia Transfer of Care Note  Patient: [name]  Procedure(s) Performed: PARS PLANA VITRECTOMY  FOR MACULAR HOLE WITH LASER AND INTRAOCULAR TAMPONADE (Left) MEMBRANECTOMY, RETINA (Left)  Patient Location: PACU  Anesthesia Type:General  Level of Consciousness: sedated and drowsy  Airway & Oxygen Therapy: Patient Spontanous Breathing and Patient connected to face mask oxygen  Post-op Assessment: Report given to RN and Post -op Vital signs reviewed and stable  Post vital signs: Reviewed and stable  Last Vitals:  Vitals Value Taken Time  BP 122/91 05/07/23 1415  Temp    Pulse 71 05/07/23 1420  Resp 21 05/07/23 1420  SpO2 94 % 05/07/23 1420  Vitals shown include unfiled device data.  Last Pain:  Vitals:   05/07/23 1045  TempSrc:   PainSc: 0-No pain         Complications: No notable events documented.

## 2023-05-07 NOTE — H&P (Signed)
 I examined the patient today and there is no change in the medical status

## 2023-05-07 NOTE — Brief Op Note (Signed)
 Brief Operative note   Preoperative diagnosis:  Macular hole, left eye Postoperative diagnosis  Post-Op Diagnosis Codes:    * Macular hole, left eye [H35.342]  Procedures: Pars plana vitrectomy, laser, membrane peel, serum patch, gas fluid exchange, all left eye   Surgeon:  Rexene Catching, MD...  Assistant:  Raymondo Calin SA    Anesthesia: General  Specimen: none  Estimated blood loss:  1cc  Complications: none  Patient sent to PACU in good condition  Composed by Rexene Catching MD  Dictation number: [id number]

## 2023-05-07 NOTE — Plan of Care (Signed)
  Problem: Coping: Goal: Level of anxiety will decrease Outcome: Progressing   Problem: Elimination: Goal: Will not experience complications related to urinary retention Outcome: Progressing   Problem: Pain Managment: Goal: General experience of comfort will improve and/or be controlled Outcome: Progressing   Problem: Safety: Goal: Ability to remain free from injury will improve Outcome: Progressing

## 2023-05-07 NOTE — Anesthesia Procedure Notes (Signed)
 Procedure Name: Intubation Date/Time: 05/07/2023 12:46 PM  Performed by: Neomia Banner, RNPre-anesthesia Checklist: Patient identified, Emergency Drugs available, Suction available and Patient being monitored Patient Re-evaluated:Patient Re-evaluated prior to induction Oxygen Delivery Method: Circle system utilized Preoxygenation: Pre-oxygenation with 100% oxygen Induction Type: IV induction Ventilation: Mask ventilation without difficulty and Oral airway inserted - appropriate to patient size Laryngoscope Size: Mac and 3 Grade View: Grade I Tube type: Oral Tube size: 7.0 mm Number of attempts: 1 Airway Equipment and Method: Stylet and Oral airway Placement Confirmation: ETT inserted through vocal cords under direct vision, positive ETCO2 and breath sounds checked- equal and bilateral Secured at: 21 cm Tube secured with: Tape Dental Injury: Teeth and Oropharynx as per pre-operative assessment

## 2023-05-07 NOTE — Anesthesia Postprocedure Evaluation (Signed)
 Anesthesia Post Note  Patient: [name]  Procedure(s) Performed: PARS PLANA VITRECTOMY  FOR MACULAR HOLE WITH LASER AND INTRAOCULAR TAMPONADE (Left) MEMBRANECTOMY, RETINA (Left)     Patient location during evaluation: PACU Anesthesia Type: General Level of consciousness: awake and alert, oriented and patient cooperative Pain management: pain level controlled Vital Signs Assessment: post-procedure vital signs reviewed and stable Respiratory status: spontaneous breathing, nonlabored ventilation and respiratory function stable Cardiovascular status: blood pressure returned to baseline and stable Postop Assessment: no apparent nausea or vomiting Anesthetic complications: no   No notable events documented.  Last Vitals:  Vitals:   05/07/23 1430 05/07/23 1445  BP: (!) 105/56 (!) 98/55  Pulse: 66 61  Resp: 15 17  Temp:  36.9 C  SpO2: 95% 95%    Last Pain:  Vitals:   05/07/23 1410  TempSrc:   PainSc: Asleep                 Jacquelyne Matte

## 2023-05-08 ENCOUNTER — Encounter (HOSPITAL_COMMUNITY): Payer: Self-pay | Admitting: Ophthalmology

## 2023-05-08 DIAGNOSIS — H35342 Macular cyst, hole, or pseudohole, left eye: Secondary | ICD-10-CM | POA: Diagnosis not present

## 2023-05-08 MED ORDER — BACITRACIN-POLYMYXIN B 500-10000 UNIT/GM OP OINT: 1.0000 | TOPICAL_OINTMENT | Freq: Three times a day (TID) | OPHTHALMIC | Status: DC

## 2023-05-08 MED ORDER — STERILE WATER FOR INJECTION IJ SOLN
INTRAMUSCULAR | Status: AC
  Administered 2023-05-08: 5 mL
  Filled 2023-05-08: qty 10

## 2023-05-08 MED ORDER — GATIFLOXACIN 0.5 % OP SOLN: 1.0000 [drp] | Freq: Four times a day (QID) | OPHTHALMIC | Status: DC

## 2023-05-08 MED ORDER — PREDNISOLONE ACETATE 1 % OP SUSP: 1.0000 [drp] | Freq: Four times a day (QID) | OPHTHALMIC | Status: DC

## 2023-05-08 NOTE — Op Note (Signed)
 NAMEVELDA, [name] MEDICAL RECORD NO: [id number] ACCOUNT NO: [id number] DATE OF BIRTH: Mar 25, 1955 FACILITY: [facility] LOCATION: [facility] PHYSICIAN: Arzella Bitters. Augustus Ledger, MD  Operative Report   DATE OF PROCEDURE: 05/07/2023  ADMISSION DIAGNOSIS:  Full-thickness macular hole, left eye.  PROCEDURES PERFORMED:  Pars plana vitrectomy, retinal photocoagulation, membrane peel, gas-fluid exchange, serum patch, all in the left eye.  SURGEON:  Sherial Dimes, MD  ASSISTANT:  Raymondo Calin, SA  ANESTHESIA:  General.  DESCRIPTION OF PROCEDURE:  Time out was performed. Usual prep and drape, the indirect ophthalmoscope, laser was moved into place.  920 laser burns were placed around the retinal periphery and weak areas of the retina.  The power was 400 milliwatts, 1000 microns each, and 0.1  seconds each.  Attention was carried to the eye   A lid speculum was placed.  A conjunctival peritomy at 2 o'clock with three-layer opening through the conjunctiva and Tenon's. A smile-shaped wound was created with a  diamond knife.  An MVR incision was made in the apex of the wound.  25-gauge trocars were placed at 4 o'clock and 10 o'clock, infusion at 4 o'clock.  Contact lens ring was anchored into place at 6 and 12 o'clock.  Provisc was placed on the corneal  surface, and the flat contact lens was placed.  A lighted pick and cutter were introduced in the eye.  The vitrectomy was begun just behind the cataractous lens.  Membranes were encountered and carefully  removed under low suction and rapid cutting.  The core vitreous was removed.  The vitrectomy was carried posteriorly and additional membranes and opacities were removed.  The vitrectomy was carried down to the macular surface, where a full-thickness macular hole was seen.  The  vitrectomy was performed around the macular hole and around the optic disc.  A silicone-tipped suction line was then placed in the eye and drawn down towards the macular hole.  The fish strike  sign occurred, and the posterior hyaloid face was lifted from  its attachments to the surface of the retina and the edges of the macular hole.  This sheet of vitreous was then removed with the vitreous cutter. The 30-degree prismatic lens was moved into place and the vitrectomy was carried into the mid-periphery  for 360 degrees, all vitreous was removed from this area.  The vitrectomy was then extended into the far periphery and additional vitreous was removed out to the vitreous base.  Some limitation of vitrectomy were created by the cataractous lens.  The  magnifying contact lens was placed on the eye and high magnification was used along with the 20-gauge diamond-dusted membrane scraper to remove the internal limiting membrane for one disc diameter around the macular hole.  The edges of the macular hole  were pushed toward each other with the diamond-dusted scraper as well. No hemorrhage was seen.  A total gas-fluid exchange was carried out at this time.  Sufficient time was allowed for additional fluid to track down the walls of the eye and collect in  the posterior segment.  The serum patch and the C3F8 mixture was prepared at this time, to a 14% concentration.  Silicone-tipped suction line was used to remove a posterior collection of fluid in the vitreous cavity. Once it was dried, a serum  patch was delivered.  The additional serum was removed with the silicone-tipped suction line.  A total gas-fluid exchange had been performed.  The instruments were removed from the eye, and C3F8 was infused through the  infusion cannula for a total  exchange of C3F8 14% for intravitreal gas.  Once this was accomplished, the scleral wound was closed with 9-0 nylon at 2 o'clock.  The conjunctiva was closed with wet field cautery. The 25-gauge trocars were removed, and the wounds were tested.  The  pressure was measured to be 10 mm with a Barraquer tonometer.  Polymyxin and ceftazidime  were rinsed around the globe for  antibiotic coverage.  Marcaine  was injected around the globe for postoperative pain.  Atropine  solution was applied.  Decadron  10 mg  was injected into the lower subconjunctival space.  Polysporin  ophthalmic ointment, a patch and shield were placed.  The patient was awakened and taken to recovery in satisfactory condition.   SHW D: 05/07/2023 2:04:06 pm T: 05/08/2023 12:18:00 am  JOB: [id number]/ [id number]

## 2023-05-08 NOTE — Progress Notes (Signed)
 05/08/2023, 6:30 AM  Mental Status:  Awake, Alert, Oriented  Anterior segment: Cornea  Clear    Anterior Chamber Clear    Lens:   Cataract  Intra Ocular Pressure 12 mmHg with Tonopen  Vitreous: Clear 95%gas bubble   Retina:  Attached   Impression: Excellent result Retina attached   Final Diagnosis: Principal Problem:   Macular hole, left   Plan: start post operative eye drops.  Discharge to home.  Give post operative instructions  Rexene Catching 05/08/2023, 6:30 AM

## 2023-05-08 NOTE — Discharge Summary (Signed)
Discharge summary not needed on [facility] patients per medical records. 

## 2023-05-14 ENCOUNTER — Encounter (INDEPENDENT_AMBULATORY_CARE_PROVIDER_SITE_OTHER): Admitting: Ophthalmology

## 2023-05-14 DIAGNOSIS — H35342 Macular cyst, hole, or pseudohole, left eye: Secondary | ICD-10-CM

## 2023-05-31 ENCOUNTER — Encounter (INDEPENDENT_AMBULATORY_CARE_PROVIDER_SITE_OTHER): Admitting: Ophthalmology

## 2023-05-31 DIAGNOSIS — H35342 Macular cyst, hole, or pseudohole, left eye: Secondary | ICD-10-CM

## 2023-06-04 ENCOUNTER — Encounter (INDEPENDENT_AMBULATORY_CARE_PROVIDER_SITE_OTHER): Admitting: Ophthalmology

## 2023-07-16 ENCOUNTER — Ambulatory Visit: Attending: Vascular Surgery

## 2023-07-16 DIAGNOSIS — I83812 Varicose veins of left lower extremities with pain: Secondary | ICD-10-CM

## 2023-07-16 NOTE — Progress Notes (Signed)
 Treated pt's LLE posterior reticular and spider veins with Asclera 1%. Pt received a total of 2 mL/20 mg of Asclera 1%, administered with a 27 gauge butterfly needle. Pt tolerated well. She only wanted to do one vial today, so this was the only area we focused on. This area sometimes is tender. She is aware she may need another treatment in the future and is interested in scheduling in the fall. She was placed in 20-30 mm Hg compression hose and given post treatment care instructions verbally and on handout. She will call if she has any questions/concerns.

## 2023-07-26 ENCOUNTER — Ambulatory Visit: Payer: Medicare Other | Admitting: Family Medicine

## 2023-07-26 ENCOUNTER — Encounter: Payer: Self-pay | Admitting: Family Medicine

## 2023-07-26 VITALS — BP 118/71 | HR 64 | Temp 98.1°F | Ht 61.5 in | Wt 140.8 lb

## 2023-07-26 DIAGNOSIS — Z5181 Encounter for therapeutic drug level monitoring: Secondary | ICD-10-CM | POA: Diagnosis not present

## 2023-07-26 DIAGNOSIS — M8588 Other specified disorders of bone density and structure, other site: Secondary | ICD-10-CM

## 2023-07-26 DIAGNOSIS — E7800 Pure hypercholesterolemia, unspecified: Secondary | ICD-10-CM | POA: Diagnosis not present

## 2023-07-26 DIAGNOSIS — F3130 Bipolar disorder, current episode depressed, mild or moderate severity, unspecified: Secondary | ICD-10-CM | POA: Diagnosis not present

## 2023-07-26 DIAGNOSIS — Z0001 Encounter for general adult medical examination with abnormal findings: Secondary | ICD-10-CM

## 2023-07-26 DIAGNOSIS — Z0000 Encounter for general adult medical examination without abnormal findings: Secondary | ICD-10-CM

## 2023-07-26 DIAGNOSIS — Z79899 Other long term (current) drug therapy: Secondary | ICD-10-CM | POA: Diagnosis not present

## 2023-07-26 DIAGNOSIS — A6000 Herpesviral infection of urogenital system, unspecified: Secondary | ICD-10-CM

## 2023-07-26 DIAGNOSIS — K2100 Gastro-esophageal reflux disease with esophagitis, without bleeding: Secondary | ICD-10-CM

## 2023-07-26 LAB — MICROSCOPIC EXAMINATION: RBC, Urine: NONE SEEN /HPF (ref 0–2)

## 2023-07-26 LAB — URINALYSIS, ROUTINE W REFLEX MICROSCOPIC
Bilirubin, UA: NEGATIVE
Glucose, UA: NEGATIVE
Ketones, UA: NEGATIVE
Nitrite, UA: NEGATIVE
Protein,UA: NEGATIVE
RBC, UA: NEGATIVE
Specific Gravity, UA: 1.015 (ref 1.005–1.030)
Urobilinogen, Ur: 0.2 mg/dL (ref 0.2–1.0)
pH, UA: 7 (ref 5.0–7.5)

## 2023-07-26 LAB — PTH, INTACT AND CALCIUM

## 2023-07-26 MED ORDER — OMEPRAZOLE 20 MG PO CPDR: 20.0000 mg | DELAYED_RELEASE_CAPSULE | Freq: Every day | ORAL | 3 refills | Status: AC

## 2023-07-26 MED ORDER — LITHIUM CARBONATE 300 MG PO CAPS: ORAL_CAPSULE | ORAL | 1 refills | Status: DC

## 2023-07-26 MED ORDER — VALACYCLOVIR HCL 500 MG PO TABS: ORAL_TABLET | ORAL | 3 refills | Status: AC

## 2023-07-26 NOTE — Progress Notes (Signed)
 Kimaya B Pisani is a 68 y.o. female presents to office today for annual physical exam examination.    Concerns today include: 1.  None she reports that she is doing well.  Since her last visit she had a macular hole repaired in the left eye and also had some varicose veins treated in the left lower extremity  Occupation: Retired, Substance use: None Health Maintenance Due  Topic Date Due   Medicare Annual Wellness (AWV)  Never done   Refills needed today: All  Immunization History  Administered Date(s) Administered   Influenza Whole 11/15/2009   Influenza,inj,Quad PF,6+ Mos 10/08/2014, 11/01/2018   Influenza-Unspecified 10/09/2013, 10/12/2015, 10/10/2017, 10/27/2021   PFIZER(Purple Top)SARS-COV-2 Vaccination 09/18/2019, 10/16/2019   PNEUMOCOCCAL CONJUGATE-20 09/03/2022   Pneumococcal Conjugate-13 08/13/2013   Pneumococcal Polysaccharide-23 06/15/2004   Td 01/16/2008   Tdap 07/16/2014   Zoster Recombinant(Shingrix) 10/17/2016, 02/13/2017   Zoster, Live 07/27/2011   Past Medical History:  Diagnosis Date   Atypical mole 06/24/1997   center back-moderate   Atypical mole 06/18/2005   left arm,right upper back-slight   Atypical mole 06/18/2005   lower mid back-moderate tx-widershave   Atypical mole 06/17/2006   right lower abdomen-features of a deep nevus   Atypical mole 06/25/2016   left thigh crease-mild   Atypical mole 06/25/2016   mid chest-moderate   Atypical mole 11/28/2017   left neck, right chest-moderate   Benign neoplasm of colon 04/2008   tubular adenoma   Bipolar I disorder, most recent episode (or current) depressed    Headache(784.0)    Other and unspecified hyperlipidemia    Other atopic dermatitis and related conditions    Squamous cell carcinoma of skin 12/14/2016   mid left back   STD (sexually transmitted disease) 1989   HSV   Social History   Socioeconomic History   Marital status: Widowed    Spouse name: Not on file   Number of children: 2    Years of education: Not on file   Highest education level: Associate degree: occupational, Scientist, product/process development, or vocational program  Occupational History   Occupation: Geologist, engineering    Employer: [employer], [employer]  Tobacco Use   Smoking status: Never   Smokeless tobacco: Never  Vaping Use   Vaping status: Never Used  Substance and Sexual Activity   Alcohol use: No    Comment: social   Drug use: No   Sexual activity: Not Currently    Partners: Male    Birth control/protection: Post-menopausal  Other Topics Concern   Not on file  Social History Narrative   Patient is a retired Geophysicist/field seismologist for [employer], retired 2019.   She has a granddaughter, whom she cares for during the week.   Social Drivers of Corporate investment banker Strain: Low Risk  (01/31/2023)   Overall Financial Resource Strain (CARDIA)    Difficulty of Paying Living Expenses: Not hard at all  Food Insecurity: No Food Insecurity (05/07/2023)   Hunger Vital Sign    Worried About Running Out of Food in the Last Year: Never true    Ran Out of Food in the Last Year: Never true  Transportation Needs: No Transportation Needs (05/07/2023)   PRAPARE - Administrator, Civil Service (Medical): No    Lack of Transportation (Non-Medical): No  Physical Activity: Insufficiently Active (01/31/2023)   Exercise Vital Sign    Days of Exercise per Week: 3 days    Minutes of Exercise per Session: 20 min  Stress: No Stress Concern Present (01/31/2023)   [employer] of Occupational Health - Occupational Stress Questionnaire    Feeling of Stress : Not at all  Social Connections: Socially Isolated (05/07/2023)   Social Connection and Isolation Panel    Frequency of Communication with Friends and Family: More than three times a week    Frequency of Social Gatherings with Friends and Family: More than three times a week    Attends Religious Services: Never    Database administrator or Organizations: No     Attends Banker Meetings: Never    Marital Status: Widowed  Intimate Partner Violence: Not At Risk (05/07/2023)   Humiliation, Afraid, Rape, and Kick questionnaire    Fear of Current or Ex-Partner: No    Emotionally Abused: No    Physically Abused: No    Sexually Abused: No   Past Surgical History:  Procedure Laterality Date   CESAREAN SECTION     x 2   EYE SURGERY Right    MEMBRANE PEEL Left 05/07/2023   Procedure: MEMBRANECTOMY, RETINA;  Surgeon: Alvia Norleen BIRCH, MD;  Location[facility];  Service: Ophthalmology;  Laterality: Left;  20/25g PPV, Membrane Peel, Serum Patch, Headscope Laser and Gas   PARS PLANA VITRECTOMY  FOR MACULAR HOLE WITH LASER AND INTRAOCULAR TAMPONADE Left 05/07/2023   Procedure: PARS PLANA VITRECTOMY  FOR MACULAR HOLE WITH LASER AND INTRAOCULAR TAMPONADE;  Surgeon: Alvia Norleen BIRCH, MD;  Location: [facility] OR;  Service: Ophthalmology;  Laterality: Left;   WISDOM TOOTH EXTRACTION  age 86   Family History  Problem Relation Age of Onset   Hearing loss Mother    Thyroid  disease Mother    Osteoporosis Mother    Heart disease Father    Hypertension Father    Failure to thrive Father        ultimate cause of death after several hospitalizations   Colon cancer Maternal Grandmother 20   Breast cancer Neg Hx     Current Outpatient Medications:    Calcium Citrate-Vitamin D  (CALCIUM + D PO), Take 1 tablet by mouth daily., Disp: , Rfl:    Ferrous Sulfate (IRON PO), Take 1 tablet by mouth every other day., Disp: , Rfl:    lithium  carbonate 300 MG capsule, TAKE ONE CAPSULE THREE TIMES DAILY WITH MEALS, Disp: 270 capsule, Rfl: 1   omeprazole  (PRILOSEC) 20 MG capsule, Take 1 capsule (20 mg total) by mouth daily., Disp: 90 capsule, Rfl: 3   valACYclovir  (VALTREX ) 500 MG tablet, TAKE ONE TABLET DAILY. (TWICE DAILY X3 DAYS FOR OUTBREAKS), Disp: 90 tablet, Rfl: 3  Allergies  Allergen Reactions   Penicillins Rash     ROS: Review of Systems A comprehensive review  of systems was negative except for: Genitourinary: positive for itching of the left labia majora    Physical exam BP 118/71   Pulse 64   Temp 98.1 F (36.7 C) (Temporal)   Ht 5' 1.5 (1.562 m)   Wt 140 lb 12.8 oz (63.9 kg)   LMP 03/16/2010 (Approximate)   SpO2 98%   BMI 26.17 kg/m  General appearance: alert, cooperative, appears stated age, and no distress Head: Normocephalic, without obvious abnormality, atraumatic Eyes: negative findings: lids and lashes normal, conjunctivae and sclerae normal, corneas clear, and pupils equal, round, reactive to light and accomodation Ears: normal TM's and external ear canals both ears Nose: Nares normal. Septum midline. Mucosa normal. No drainage or sinus tenderness. Throat: lips, mucosa, and tongue normal; teeth and gums  normal Neck: no adenopathy, supple, symmetrical, trachea midline, and thyroid  not enlarged, symmetric, no tenderness/mass/nodules Back: Increased kyphosis of thoracic spine present Lungs: clear to auscultation bilaterally Heart: regular rate and rhythm, S1, S2 normal, no murmur, click, rub or gallop Abdomen: soft, non-tender; bowel sounds normal; no masses,  no organomegaly Extremities: extremities normal, atraumatic, no cyanosis or edema Pulses: 2+ and symmetric Skin: Varicose veins in the right anterior medial thigh as well as the left posterior medial calf Lymph nodes: Cervical, supraclavicular, and axillary nodes normal. Neurologic: Grossly normal  GU: Mild hyperemia along the left labia majora but no discrete lesions or discharge     07/26/2023    7:56 AM 04/30/2023    3:02 PM 02/01/2023    8:25 AM  Depression screen PHQ 2/9  Decreased Interest 0 0 0  Down, Depressed, Hopeless 0 0 0  PHQ - 2 Score 0 0 0  Altered sleeping 0  0  Tired, decreased energy 0  0  Change in appetite 0  0  Feeling bad or failure about yourself  0  0  Trouble concentrating 0  0  Moving slowly or fidgety/restless 0  0  Suicidal thoughts 0  0   PHQ-9 Score 0  0  Difficult doing work/chores Not difficult at all        07/26/2023    7:56 AM 02/01/2023    8:25 AM 07/25/2022    7:58 AM 01/12/2022    8:08 AM  GAD 7 : Generalized Anxiety Score  Nervous, Anxious, on Edge 0 0 0 0  Control/stop worrying 0 0 0 0  Worry too much - different things 0 0 0 0  Trouble relaxing 0 0 0 0  Restless 0 0 0 0  Easily annoyed or irritable 0 0 0 0  Afraid - awful might happen 0 0 0 0  Total GAD 7 Score 0 0 0 0  Anxiety Difficulty Not difficult at all  Not difficult at all Not difficult at all     Assessment/ Plan: Steward KATHEE Salmon here for annual physical exam.   Annual physical exam  Bipolar I disorder, most recent episode (or current) depressed (HCC) - Plan: CMP14+EGFR, CBC, Urinalysis, Routine w reflex microscopic, PTH, Intact and Calcium, Lithium  level, TSH, lithium  carbonate 300 MG capsule  Encounter for lithium  monitoring - Plan: CMP14+EGFR, CBC, Urinalysis, Routine w reflex microscopic, PTH, Intact and Calcium, Lithium  level, TSH  Pure hypercholesterolemia - Plan: CMP14+EGFR, Lipid Panel, TSH  Osteopenia of lumbar spine - Plan: CMP14+EGFR, VITAMIN D  25 Hydroxy (Vit-D Deficiency, Fractures)  Gastroesophageal reflux disease with esophagitis without hemorrhage - Plan: CMP14+EGFR, CBC, VITAMIN D  25 Hydroxy (Vit-D Deficiency, Fractures), omeprazole  (PRILOSEC) 20 MG capsule  Herpes simplex infection of genitourinary system - Plan: valACYclovir  (VALTREX ) 500 MG tablet  Plan for ECG at next visit.  Obtain fasting labs.  Medications have been renewed.  Bipolar disorder stable  Reinforced balanced diet, regular exercise  Not due for DEXA scan.  Check vitamin D , calcium level.  She is compliant with OTC meds  GERD is chronic and stable.  PPI renewed.  Check CBC  HSV is chronic and stable.  No active lesions.  Valtrex  renewed Counseled on healthy lifestyle choices, including diet (rich in fruits, vegetables and lean meats and low in salt  and simple carbohydrates) and exercise (at least 30 minutes of moderate physical activity daily).  Patient to follow up 48m  Alianis Trimmer M. Jolinda, DO

## 2023-07-27 LAB — TSH: TSH: 1.05 u[IU]/mL (ref 0.450–4.500)

## 2023-07-27 LAB — CBC
Hematocrit: 41.1 % (ref 34.0–46.6)
Hemoglobin: 13.4 g/dL (ref 11.1–15.9)
MCH: 29.3 pg (ref 26.6–33.0)
MCHC: 32.6 g/dL (ref 31.5–35.7)
MCV: 90 fL (ref 79–97)
Platelets: 295 x10E3/uL (ref 150–450)
RBC: 4.57 x10E6/uL (ref 3.77–5.28)
RDW: 12.9 % (ref 11.7–15.4)
WBC: 6.4 x10E3/uL (ref 3.4–10.8)

## 2023-07-27 LAB — PTH, INTACT AND CALCIUM

## 2023-07-27 LAB — LIPID PANEL
Chol/HDL Ratio: 3.8 ratio (ref 0.0–4.4)
Cholesterol, Total: 197 mg/dL (ref 100–199)
HDL: 52 mg/dL (ref >39)
LDL Chol Calc (NIH): 122 mg/dL — ABNORMAL HIGH (ref 0–99)
Triglycerides: 130 mg/dL (ref 0–149)
VLDL Cholesterol Cal: 23 mg/dL (ref 5–40)

## 2023-07-27 LAB — CMP14+EGFR
ALT: 19 IU/L (ref 0–32)
AST: 16 IU/L (ref 0–40)
Albumin: 4.3 g/dL (ref 3.9–4.9)
Alkaline Phosphatase: 71 IU/L (ref 44–121)
BUN/Creatinine Ratio: 9 — ABNORMAL LOW (ref 12–28)
BUN: 6 mg/dL — ABNORMAL LOW (ref 8–27)
Bilirubin Total: 0.8 mg/dL (ref 0.0–1.2)
CO2: 22 mmol/L (ref 20–29)
Calcium: 10.1 mg/dL (ref 8.7–10.3)
Chloride: 108 mmol/L — ABNORMAL HIGH (ref 96–106)
Creatinine, Ser: 0.69 mg/dL (ref 0.57–1.00)
Globulin, Total: 2.2 g/dL (ref 1.5–4.5)
Glucose: 89 mg/dL (ref 70–99)
Potassium: 4.1 mmol/L (ref 3.5–5.2)
Sodium: 142 mmol/L (ref 134–144)
Total Protein: 6.5 g/dL (ref 6.0–8.5)
eGFR: 96 mL/min/1.73 (ref >59)

## 2023-07-27 LAB — VITAMIN D 25 HYDROXY (VIT D DEFICIENCY, FRACTURES): Vit D, 25-Hydroxy: 52.6 ng/mL (ref 30.0–100.0)

## 2023-07-27 LAB — LITHIUM LEVEL: Lithium Lvl: 0.9 mmol/L (ref 0.5–1.2)

## 2023-07-29 ENCOUNTER — Ambulatory Visit: Payer: Self-pay | Admitting: Family Medicine

## 2023-08-02 ENCOUNTER — Other Ambulatory Visit: Payer: Self-pay

## 2023-08-02 ENCOUNTER — Other Ambulatory Visit

## 2023-08-03 LAB — PARATHYROID HORMONE, INTACT (NO CA): PTH: 54 pg/mL (ref 15–65)

## 2023-08-05 ENCOUNTER — Ambulatory Visit: Payer: Self-pay | Admitting: Family Medicine

## 2023-08-23 ENCOUNTER — Encounter (INDEPENDENT_AMBULATORY_CARE_PROVIDER_SITE_OTHER): Admitting: Ophthalmology

## 2023-08-23 DIAGNOSIS — L814 Other melanin hyperpigmentation: Secondary | ICD-10-CM | POA: Diagnosis not present

## 2023-08-23 DIAGNOSIS — L570 Actinic keratosis: Secondary | ICD-10-CM | POA: Diagnosis not present

## 2023-08-23 DIAGNOSIS — L821 Other seborrheic keratosis: Secondary | ICD-10-CM | POA: Diagnosis not present

## 2023-08-23 DIAGNOSIS — B078 Other viral warts: Secondary | ICD-10-CM | POA: Diagnosis not present

## 2023-08-23 DIAGNOSIS — R238 Other skin changes: Secondary | ICD-10-CM | POA: Diagnosis not present

## 2023-08-23 DIAGNOSIS — H2513 Age-related nuclear cataract, bilateral: Secondary | ICD-10-CM

## 2023-08-23 DIAGNOSIS — H35342 Macular cyst, hole, or pseudohole, left eye: Secondary | ICD-10-CM

## 2023-08-23 DIAGNOSIS — H43813 Vitreous degeneration, bilateral: Secondary | ICD-10-CM | POA: Diagnosis not present

## 2023-08-23 DIAGNOSIS — D485 Neoplasm of uncertain behavior of skin: Secondary | ICD-10-CM | POA: Diagnosis not present

## 2023-08-23 DIAGNOSIS — D225 Melanocytic nevi of trunk: Secondary | ICD-10-CM | POA: Diagnosis not present

## 2023-08-23 DIAGNOSIS — D234 Other benign neoplasm of skin of scalp and neck: Secondary | ICD-10-CM | POA: Diagnosis not present

## 2023-09-06 ENCOUNTER — Other Ambulatory Visit: Payer: Self-pay | Admitting: Nurse Practitioner

## 2023-09-06 DIAGNOSIS — Z1231 Encounter for screening mammogram for malignant neoplasm of breast: Secondary | ICD-10-CM

## 2023-09-30 ENCOUNTER — Ambulatory Visit (INDEPENDENT_AMBULATORY_CARE_PROVIDER_SITE_OTHER)

## 2023-09-30 VITALS — BP 118/71 | HR 64 | Ht 61.0 in | Wt 140.0 lb

## 2023-09-30 DIAGNOSIS — Z0000 Encounter for general adult medical examination without abnormal findings: Secondary | ICD-10-CM

## 2023-09-30 NOTE — Progress Notes (Signed)
 Subjective:   [name] is a 68 y.o. who presents for a Medicare Wellness preventive visit.  As a reminder, Annual Wellness Visits don't include a physical exam, and some assessments may be limited, especially if this visit is performed virtually. We may recommend an in-person follow-up visit with your provider if needed.  Visit Complete: Virtual I connected with  [name] on 09/30/23 by a audio enabled telemedicine application and verified that I am speaking with the correct person using two identifiers.  Patient Location: Home  Provider Location: Home Office  I discussed the limitations of evaluation and management by telemedicine. The patient expressed understanding and agreed to proceed.  Vital Signs: Because this visit was a virtual/telehealth visit, some criteria may be missing or patient reported. Any vitals not documented were not able to be obtained and vitals that have been documented are patient reported.  VideoDeclined- This patient declined Librarian, academic. Therefore the visit was completed with audio only.  Persons Participating in Visit: Patient.  AWV Questionnaire: No: Patient Medicare AWV questionnaire was not completed prior to this visit.  Cardiac Risk Factors include: advanced age (>9men, >17 women);dyslipidemia     Objective:    Today's Vitals   09/30/23 1433  BP: 118/71  Pulse: 64  Weight: 140 lb (63.5 kg)  Height: 5' 1 (1.549 m)   Body mass index is 26.45 kg/m.     09/30/2023    2:36 PM 05/07/2023   10:46 AM  Advanced Directives  Does Patient Have a Medical Advance Directive? No No  Would patient like information on creating a medical advance directive?  No - Patient declined    Current Medications (verified) Outpatient Encounter Medications as of 09/30/2023  Medication Sig   Calcium Citrate-Vitamin D  (CALCIUM + D PO) Take 1 tablet by mouth daily.   Ferrous Sulfate (IRON PO) Take 1 tablet by mouth  every other day.   lithium  carbonate 300 MG capsule TAKE ONE CAPSULE THREE TIMES DAILY WITH MEALS   omeprazole  (PRILOSEC) 20 MG capsule Take 1 capsule (20 mg total) by mouth daily.   valACYclovir  (VALTREX ) 500 MG tablet TAKE ONE TABLET DAILY. (TWICE DAILY X3 DAYS FOR OUTBREAKS)   No facility-administered encounter medications on file as of 09/30/2023.    Allergies (verified) Penicillins   History: Past Medical History:  Diagnosis Date   Allergy    Pennacellin   Atypical mole 06/24/1997   center back-moderate   Atypical mole 06/18/2005   left arm,right upper back-slight   Atypical mole 06/18/2005   lower mid back-moderate tx-widershave   Atypical mole 06/17/2006   right lower abdomen-features of a deep nevus   Atypical mole 06/25/2016   left thigh crease-mild   Atypical mole 06/25/2016   mid chest-moderate   Atypical mole 11/28/2017   left neck, right chest-moderate   Benign neoplasm of colon 04/2008   tubular adenoma   Bipolar I disorder, most recent episode (or current) depressed    Depression 1978   None   Headache(784.0)    Other and unspecified hyperlipidemia    Other atopic dermatitis and related conditions    Squamous cell carcinoma of skin 12/14/2016   mid left back   STD (sexually transmitted disease) 1989   HSV   Past Surgical History:  Procedure Laterality Date   CESAREAN SECTION     x 2   EYE SURGERY Right    MEMBRANE PEEL Left 05/07/2023   Procedure: MEMBRANECTOMY, RETINA;  Surgeon: Alvia Rush  D, MD;  Location: [facility] OR;  Service: Ophthalmology;  Laterality: Left;  20/25g PPV, Membrane Peel, Serum Patch, Headscope Laser and Gas   PARS PLANA VITRECTOMY  FOR MACULAR HOLE WITH LASER AND INTRAOCULAR TAMPONADE Left 05/07/2023   Procedure: PARS PLANA VITRECTOMY  FOR MACULAR HOLE WITH LASER AND INTRAOCULAR TAMPONADE;  Surgeon: Alvia Norleen BIRCH, MD;  Location: [facility] OR;  Service: Ophthalmology;  Laterality: Left;   WISDOM TOOTH EXTRACTION  age 65   Family  History  Problem Relation Age of Onset   Hearing loss Mother    Thyroid  disease Mother    Osteoporosis Mother    Heart disease Father    Hypertension Father    Failure to thrive Father        ultimate cause of death after several hospitalizations   Colon cancer Maternal Grandmother 37   Breast cancer Neg Hx    Social History   Socioeconomic History   Marital status: Widowed    Spouse name: Not on file   Number of children: 2   Years of education: Not on file   Highest education level: Associate degree: occupational, Scientist, product/process development, or vocational program  Occupational History   Occupation: Geologist, engineering    Employer: [employer]rapist, [employer]  Tobacco Use   Smoking status: Never   Smokeless tobacco: Never  Vaping Use   Vaping status: Never Used  Substance and Sexual Activity   Alcohol use: No    Comment: social   Drug use: No   Sexual activity: Not Currently    Partners: Male    Birth control/protection: Post-menopausal  Other Topics Concern   Not on file  Social History Narrative   Patient is a retired Geophysicist/field seismologist for [employer], retired 2019.   She has a granddaughter, whom she cares for during the week.   Social Drivers of Corporate investment banker Strain: Low Risk  (09/30/2023)   Overall Financial Resource Strain (CARDIA)    Difficulty of Paying Living Expenses: Not hard at all  Food Insecurity: No Food Insecurity (09/30/2023)   Hunger Vital Sign    Worried About Running Out of Food in the Last Year: Never true    Ran Out of Food in the Last Year: Never true  Transportation Needs: No Transportation Needs (09/30/2023)   PRAPARE - Administrator, Civil Service (Medical): No    Lack of Transportation (Non-Medical): No  Physical Activity: Insufficiently Active (09/30/2023)   Exercise Vital Sign    Days of Exercise per Week: 3 days    Minutes of Exercise per Session: 30 min  Stress: No Stress Concern Present (09/30/2023)   [employer] of  Occupational Health - Occupational Stress Questionnaire    Feeling of Stress: Not at all  Social Connections: Socially Isolated (09/30/2023)   Social Connection and Isolation Panel    Frequency of Communication with Friends and Family: More than three times a week    Frequency of Social Gatherings with Friends and Family: More than three times a week    Attends Religious Services: Never    Database administrator or Organizations: No    Attends Banker Meetings: Never    Marital Status: Widowed    Tobacco Counseling Counseling given: Yes    Clinical Intake:  Pre-visit preparation completed: Yes  Pain : No/denies pain     BMI - recorded: 26.45 Nutritional Status: BMI 25 -29 Overweight Nutritional Risks: None Diabetes: No  No results found for: HGBA1C  How often do you need to have someone help you when you read instructions, pamphlets, or other written materials from your doctor or pharmacy?: 1 - Never  Interpreter Needed?: No  Information entered by :: alia t/cma   Activities of Daily Living     09/30/2023    2:36 PM 05/07/2023   10:44 AM  In your present state of health, do you have any difficulty performing the following activities:  Hearing? 0   Vision? 0   Difficulty concentrating or making decisions? 0   Walking or climbing stairs? 0   Dressing or bathing? 0   Doing errands, shopping? 0 0  Preparing Food and eating ? N   Using the Toilet? N   In the past six months, have you accidently leaked urine? Y   Do you have problems with loss of bowel control? N   Managing your Medications? N   Managing your Finances? N   Housekeeping or managing your Housekeeping? N     Patient Care Team: Jolinda Norene HERO, DO as PCP - General (Family Medicine) Alvia Norleen BIRCH, MD as Consulting Physician (Ophthalmology)  I have updated your Care Teams any recent Medical Services you may have received from other providers in the past year.     Assessment:    This is a routine wellness examination for [place].  Hearing/Vision screen Hearing Screening - Comments:: Pt denies hearing dif Vision Screening - Comments:: Pt wear glasses/pt goes Dr. Octavia in [place] ov 2025   Goals Addressed   None    Depression Screen     09/30/2023    2:39 PM 07/26/2023    7:56 AM 04/30/2023    3:02 PM 02/01/2023    8:25 AM 01/14/2023    1:55 PM 07/25/2022    7:58 AM 01/12/2022    8:08 AM  PHQ 2/9 Scores  PHQ - 2 Score 0 0 0 0 0 0 0  PHQ- 9 Score  0  0  0 1    Fall Risk     09/30/2023    2:35 PM 07/26/2023    7:56 AM 04/30/2023    2:59 PM 01/12/2022    8:07 AM 07/11/2021    9:25 AM  Fall Risk   Falls in the past year? 0 0 0 1 1  Number falls in past yr: 0  0 0 0  Injury with Fall? 0  0 0 0  Risk for fall due to : No Fall Risks  Impaired balance/gait History of fall(s) History of fall(s)  Follow up Falls evaluation completed  Falls evaluation completed Education provided  Falls evaluation completed      Data saved with a previous flowsheet row definition    MEDICARE RISK AT HOME:  Medicare Risk at Home Any stairs in or around the home?: Yes If so, are there any without handrails?: Yes Home free of loose throw rugs in walkways, pet beds, electrical cords, etc?: Yes Adequate lighting in your home to reduce risk of falls?: Yes Life alert?: No Use of a cane, walker or w/c?: No Grab bars in the bathroom?: Yes Shower chair or bench in shower?: Yes Elevated toilet seat or a handicapped toilet?: Yes  TIMED UP AND GO:  Was the test performed?  no  Cognitive Function: 6CIT completed        09/30/2023    2:38 PM  6CIT Screen  What Year? 0 points  What month? 0 points  What time? 0 points  Count back from  20 0 points  Months in reverse 0 points  Repeat phrase 0 points  Total Score 0 points    Immunizations Immunization History  Administered Date(s) Administered   Influenza Whole 11/15/2009   Influenza,inj,Quad PF,6+ Mos  10/08/2014, 11/01/2018   Influenza-Unspecified 10/09/2013, 10/12/2015, 10/10/2017, 10/27/2021   PFIZER(Purple Top)SARS-COV-2 Vaccination 09/18/2019, 10/16/2019   PNEUMOCOCCAL CONJUGATE-20 09/03/2022   Pneumococcal Conjugate-13 08/13/2013   Pneumococcal Polysaccharide-23 06/15/2004   Td 01/16/2008   Tdap 07/16/2014   Zoster Recombinant(Shingrix) 10/17/2016, 02/13/2017   Zoster, Live 07/27/2011    Screening Tests Health Maintenance  Topic Date Due   Influenza Vaccine  08/16/2023   COVID-19 Vaccine (3 - Pfizer risk series) 08/10/2024 (Originally 11/13/2019)   DTaP/Tdap/Td (3 - Td or Tdap) 07/15/2024   Medicare Annual Wellness (AWV)  09/29/2024   Mammogram  01/13/2025   Colonoscopy  08/05/2028   Pneumococcal Vaccine: 50+ Years  Completed   DEXA SCAN  Completed   Hepatitis C Screening  Completed   Zoster Vaccines- Shingrix  Completed   HPV VACCINES  Aged Out   Meningococcal B Vaccine  Aged Out    Health Maintenance Items Addressed: See Nurse Notes at the end of this note  Additional Screening:  Vision Screening: Recommended annual ophthalmology exams for early detection of glaucoma and other disorders of the eye. Is the patient up to date with their annual eye exam?  Yes  Who is the provider or what is the name of the office in which the patient attends annual eye exams? Dr. Octavia in [place], [place]  Dental Screening: Recommended annual dental exams for proper oral hygiene  Community Resource Referral / Chronic Care Management: CRR required this visit?  No   CCM required this visit?  No   Plan:    I have personally reviewed and noted the following in the patient's chart:   Medical and social history Use of alcohol, tobacco or illicit drugs  Current medications and supplements including opioid prescriptions. Patient is not currently taking opioid prescriptions. Functional ability and status Nutritional status Physical activity Advanced directives List of other  physicians Hospitalizations, surgeries, and ER visits in previous 12 months Vitals Screenings to include cognitive, depression, and falls Referrals and appointments  In addition, I have reviewed and discussed with patient certain preventive protocols, quality metrics, and best practice recommendations. A written personalized care plan for preventive services as well as general preventive health recommendations were provided to patient.   Ozie Ned, CMA   09/30/2023   After Visit Summary: (MyChart) Due to this being a telephonic visit, the after visit summary with patients personalized plan was offered to patient via MyChart   Notes: Nothing significant to report at this time.

## 2023-09-30 NOTE — Patient Instructions (Signed)
 [name],  Thank you for taking the time for your Medicare Wellness Visit. I appreciate your continued commitment to your health goals. Please review the care plan we discussed, and feel free to reach out if I can assist you further.  Medicare recommends these wellness visits once per year to help you and your care team stay ahead of potential health issues. These visits are designed to focus on prevention, allowing your provider to concentrate on managing your acute and chronic conditions during your regular appointments.  Please note that Annual Wellness Visits do not include a physical exam. Some assessments may be limited, especially if the visit was conducted virtually. If needed, we may recommend a separate in-person follow-up with your provider.  Ongoing Care Seeing your primary care provider every 3 to 6 months helps us  monitor your health and provide consistent, personalized care.   Referrals If a referral was made during today's visit and you haven't received any updates within two weeks, please contact the referred provider directly to check on the status.  Recommended Screenings:  Health Maintenance  Topic Date Due   Medicare Annual Wellness Visit  Never done   Flu Shot  08/16/2023   COVID-19 Vaccine (3 - Pfizer risk series) 08/10/2024*   DTaP/Tdap/Td vaccine (3 - Td or Tdap) 07/15/2024   Breast Cancer Screening  01/13/2025   Colon Cancer Screening  08/05/2028   Pneumococcal Vaccine for age over 10  Completed   DEXA scan (bone density measurement)  Completed   Hepatitis C Screening  Completed   Zoster (Shingles) Vaccine  Completed   HPV Vaccine  Aged Out   Meningitis B Vaccine  Aged Out  *Topic was postponed. The date shown is not the original due date.       09/30/2023    2:36 PM  Advanced Directives  Does Patient Have a Medical Advance Directive? No   Advance Care Planning is important because it: Ensures you receive medical care that aligns with your values,  goals, and preferences. Provides guidance to your family and loved ones, reducing the emotional burden of decision-making during critical moments.  Vision: Annual vision screenings are recommended for early detection of glaucoma, cataracts, and diabetic retinopathy. These exams can also reveal signs of chronic conditions such as diabetes and high blood pressure.  Dental: Annual dental screenings help detect early signs of oral cancer, gum disease, and other conditions linked to overall health, including heart disease and diabetes.  Please see the attached documents for additional preventive care recommendations.

## 2023-10-18 DIAGNOSIS — L814 Other melanin hyperpigmentation: Secondary | ICD-10-CM | POA: Diagnosis not present

## 2023-10-18 DIAGNOSIS — D225 Melanocytic nevi of trunk: Secondary | ICD-10-CM | POA: Diagnosis not present

## 2023-10-18 DIAGNOSIS — L570 Actinic keratosis: Secondary | ICD-10-CM | POA: Diagnosis not present

## 2023-10-18 DIAGNOSIS — L821 Other seborrheic keratosis: Secondary | ICD-10-CM | POA: Diagnosis not present

## 2023-10-18 DIAGNOSIS — L244 Irritant contact dermatitis due to drugs in contact with skin: Secondary | ICD-10-CM | POA: Diagnosis not present

## 2023-10-18 DIAGNOSIS — L2089 Other atopic dermatitis: Secondary | ICD-10-CM | POA: Diagnosis not present

## 2023-10-18 DIAGNOSIS — Z09 Encounter for follow-up examination after completed treatment for conditions other than malignant neoplasm: Secondary | ICD-10-CM | POA: Diagnosis not present

## 2023-11-22 ENCOUNTER — Encounter (INDEPENDENT_AMBULATORY_CARE_PROVIDER_SITE_OTHER): Admitting: Ophthalmology

## 2023-11-22 DIAGNOSIS — H35342 Macular cyst, hole, or pseudohole, left eye: Secondary | ICD-10-CM

## 2023-11-22 DIAGNOSIS — H43811 Vitreous degeneration, right eye: Secondary | ICD-10-CM | POA: Diagnosis not present

## 2023-12-10 ENCOUNTER — Ambulatory Visit (INDEPENDENT_AMBULATORY_CARE_PROVIDER_SITE_OTHER): Admitting: Nurse Practitioner

## 2023-12-10 VITALS — BP 139/84 | HR 69 | Temp 97.8°F | Ht 61.0 in | Wt 145.0 lb

## 2023-12-10 DIAGNOSIS — R350 Frequency of micturition: Secondary | ICD-10-CM

## 2023-12-10 DIAGNOSIS — J029 Acute pharyngitis, unspecified: Secondary | ICD-10-CM

## 2023-12-10 LAB — MICROSCOPIC EXAMINATION
Bacteria, UA: NONE SEEN
Epithelial Cells (non renal): NONE SEEN /HPF (ref 0–10)
RBC, Urine: NONE SEEN /HPF (ref 0–2)
WBC, UA: NONE SEEN /HPF (ref 0–5)

## 2023-12-10 LAB — URINALYSIS, ROUTINE W REFLEX MICROSCOPIC
Bilirubin, UA: NEGATIVE
Glucose, UA: NEGATIVE
Ketones, UA: NEGATIVE
Nitrite, UA: NEGATIVE
Protein,UA: NEGATIVE
RBC, UA: NEGATIVE
Specific Gravity, UA: 1.01 (ref 1.005–1.030)
Urobilinogen, Ur: 0.2 mg/dL (ref 0.2–1.0)
pH, UA: 7 (ref 5.0–7.5)

## 2023-12-10 LAB — CULTURE, GROUP A STREP

## 2023-12-10 LAB — RAPID STREP SCREEN (MED CTR MEBANE ONLY): Strep Gp A Ag, IA W/Reflex: NEGATIVE

## 2023-12-10 NOTE — Progress Notes (Signed)
   Subjective:    Patient ID: [name], female    DOB: 06/26/1955, 68 y.o.   MRN: [id number]   Chief Complaint: Sore Throat and Urinary Frequency   Sore Throat   Urinary Frequency  Associated symptoms include frequency.    Patient come sin today with 2 complaints: - sore throat- started 3 days ago. More scratchy then hurting. No fever , cough of congestion. - urinary frequency- denies urgency or dysuria. Urine has been normal in appearance. No fever or back pain. Patient Active Problem List   Diagnosis Date Noted   Macular hole, left 05/07/2023   Tendinitis of left rotator cuff 01/06/2020   Gastroesophageal reflux disease with esophagitis 01/05/2018   Varicose veins of leg with pain, left 01/31/2017   Osteopenia 10/14/2013   Vitamin D  deficiency 03/23/2013   Bipolar I disorder, most recent episode (or current) depressed (HCC)    Benign neoplasm of colon    Other atopic dermatitis and related conditions    Hyperlipidemia         Review of Systems  HENT:  Positive for sore throat.   Genitourinary:  Positive for frequency.       Objective:   Physical Exam Constitutional:      Appearance: Normal appearance.  HENT:     Right Ear: Tympanic membrane normal.     Left Ear: Tympanic membrane normal.     Nose: No congestion or rhinorrhea.     Mouth/Throat:     Pharynx: Posterior oropharyngeal erythema (mild  on left) present. No oropharyngeal exudate.  Cardiovascular:     Rate and Rhythm: Normal rate and regular rhythm.     Heart sounds: Normal heart sounds.  Pulmonary:     Breath sounds: Normal breath sounds.  Abdominal:     Tenderness: There is no right CVA tenderness or left CVA tenderness.  Skin:    General: Skin is warm.  Neurological:     General: No focal deficit present.     Mental Status: She is alert and oriented to person, place, and time.  Psychiatric:        Mood and Affect: Mood normal.        Behavior: Behavior normal.    BP 139/84   Pulse  69   Temp 97.8 F (36.6 C) (Temporal)   Ht 5' 1 (1.549 m)   Wt 145 lb (65.8 kg)   LMP 03/16/2010 (Approximate)   SpO2 98%   BMI 27.40 kg/m   Urine clear Strep negative      Assessment & Plan:   [name] in today with chief complaint of Sore Throat and Urinary Frequency   1. Frequent urination (Primary) Urine clear Cranberry juice - Urinalysis, Routine w reflex microscopic - Urine Culture  2. Sore throat Force fluids Motrin  or tylenol  OTC OTC decongestant Throat lozenges if help New toothbrush in 3 days  - Rapid Strep Screen ([facility] ONLY)    The above assessment and management plan was discussed with the patient. The patient verbalized understanding of and has agreed to the management plan. Patient is aware to call the clinic if symptoms persist or worsen. Patient is aware when to return to the clinic for a follow-up visit. Patient educated on when it is appropriate to go to the emergency department.   Mary-Margaret Gladis, FNP

## 2023-12-10 NOTE — Patient Instructions (Signed)
Force fluids °Motrin or tylenol OTC °OTC decongestant °Throat lozenges if help °New toothbrush in 3 days ° °

## 2023-12-12 LAB — URINE CULTURE

## 2023-12-16 ENCOUNTER — Ambulatory Visit: Payer: Self-pay | Admitting: Nurse Practitioner

## 2024-01-16 NOTE — Progress Notes (Unsigned)
" ° °  [name] 1955-04-18 [id number]   History:  68 y.o. [id number] presents for breast and pelvic exam. Postmenopausal - no HRT, no bleeding. 12/2021 ASCUS + HR HPV neg 16/18/45, VAIN-1. LGSIL 2021, normal 2022. HLD, GERD, osteopenia managed by PCP. Takes Valtrex  daily but would like to take every other day due to costs. Has not had outbreak in 3-4 years.   Gynecologic History Patient's last menstrual period was 03/16/2010.   Contraception: post menopausal status Sexually active: Yes  Health Maintenance Last Pap: 01/14/2023. Results were: ASCUS neg HR HPV Last mammogram: 01/17/2023 (today). Results were: Pending Last colonoscopy: 08/06/2018. Due in 2027 Last Dexa: 2023. Results were: Osteopenia  Past medical history, past surgical history, family history and social history were all reviewed and documented in the EPIC chart. Widowed. In relationship, he lives in [place]. Retired. Son, designer, multimedia, works for [employer], has 51 yo daughter and 8 mo son. Patient keeps them a few days per week. Daughter local, pregnant with first through IVF, nurse.   ROS:  A ROS was performed and pertinent positives and negatives are included.  Exam:  There were no vitals filed for this visit.  There is no height or weight on file to calculate BMI. Physical Exam Constitutional:      Appearance: Normal appearance.  Neck:     Thyroid : No thyroid  mass, thyromegaly or thyroid  tenderness.  Cardiovascular:     Rate and Rhythm: Normal rate and regular rhythm.  Pulmonary:     Effort: Pulmonary effort is normal.     Breath sounds: Normal breath sounds.  Chest:  Breasts:    Right: Normal.     Left: Normal.  Abdominal:     Palpations: Abdomen is soft.     Tenderness: There is no abdominal tenderness.  Genitourinary:    General: Normal vulva.     Vagina: Normal.     Cervix: Normal.     Uterus: Normal.      Adnexa: Right adnexa normal and left adnexa normal.     Rectum: Normal.     Comments: Atrophic  changes Lymphadenopathy:     Upper Body:     Right upper body: No supraclavicular or axillary adenopathy.     Left upper body: No supraclavicular or axillary adenopathy.      Assessment/Plan:  69 y.o. [id number] for breast and pelvic exam.   Encounter for breast and pelvic examination - Education provided on SBEs, importance of preventative screenings, current guidelines, high calcium diet, regular exercise, and multivitamin daily. Labs with PCP.   VAIN I (vaginal intraepithelial neoplasia grade I) - Plan: Cytology - PAP( [facility]).   Postmenopausal - no HRT, no bleeding.   Cervical cancer screening - Plan: Cytology - PAP( [facility]). See HPI for pap history. Pap today per guidelines.   HSV (herpes simplex virus) anogenital infection - OK to decrease to every other day or just take as needed. Has not had an outbreak in years. PCP provides prescription.   Screening for breast cancer - Normal mammogram history.  Continue annual screenings.  Normal breast exam today.  Screening for colon cancer - 2020 colonoscopy. Will repeat at 7-year interval per GI's recommendation.   Osteopenia, unspecified location - Managed by PCP. DXA last year per patient.   No follow-ups on file.    Lauren DELENA Shutter DNP, 5:47 PM 01/16/2024 "

## 2024-01-17 ENCOUNTER — Ambulatory Visit
Admission: RE | Admit: 2024-01-17 | Discharge: 2024-01-17 | Disposition: A | Discharge status: Home / Self Care | Source: Ambulatory Visit | Attending: Nurse Practitioner | Admitting: Nurse Practitioner

## 2024-01-17 ENCOUNTER — Ambulatory Visit (INDEPENDENT_AMBULATORY_CARE_PROVIDER_SITE_OTHER): Payer: Self-pay | Admitting: Family Medicine

## 2024-01-17 ENCOUNTER — Encounter: Payer: Self-pay | Admitting: Family Medicine

## 2024-01-17 ENCOUNTER — Encounter: Payer: Self-pay | Admitting: Nurse Practitioner

## 2024-01-17 ENCOUNTER — Ambulatory Visit: Payer: Self-pay | Admitting: Family Medicine

## 2024-01-17 ENCOUNTER — Other Ambulatory Visit (HOSPITAL_COMMUNITY)
Admission: RE | Admit: 2024-01-17 | Discharge: 2024-01-17 | Disposition: A | Source: Ambulatory Visit | Attending: Nurse Practitioner | Admitting: Nurse Practitioner

## 2024-01-17 ENCOUNTER — Ambulatory Visit: Admitting: Nurse Practitioner

## 2024-01-17 ENCOUNTER — Ambulatory Visit

## 2024-01-17 VITALS — BP 125/74 | HR 57 | Temp 97.5°F | Ht 61.0 in | Wt 144.0 lb

## 2024-01-17 VITALS — BP 120/76 | HR 55 | Ht 62.5 in | Wt 144.0 lb

## 2024-01-17 DIAGNOSIS — R682 Dry mouth, unspecified: Secondary | ICD-10-CM

## 2024-01-17 DIAGNOSIS — F3130 Bipolar disorder, current episode depressed, mild or moderate severity, unspecified: Secondary | ICD-10-CM

## 2024-01-17 DIAGNOSIS — Z9289 Personal history of other medical treatment: Secondary | ICD-10-CM | POA: Diagnosis not present

## 2024-01-17 DIAGNOSIS — Z124 Encounter for screening for malignant neoplasm of cervix: Secondary | ICD-10-CM | POA: Insufficient documentation

## 2024-01-17 DIAGNOSIS — Z5181 Encounter for therapeutic drug level monitoring: Secondary | ICD-10-CM

## 2024-01-17 DIAGNOSIS — Z780 Asymptomatic menopausal state: Secondary | ICD-10-CM | POA: Diagnosis not present

## 2024-01-17 DIAGNOSIS — Z01419 Encounter for gynecological examination (general) (routine) without abnormal findings: Secondary | ICD-10-CM

## 2024-01-17 DIAGNOSIS — Z79899 Other long term (current) drug therapy: Secondary | ICD-10-CM | POA: Diagnosis not present

## 2024-01-17 DIAGNOSIS — R87810 Cervical high risk human papillomavirus (HPV) DNA test positive: Secondary | ICD-10-CM | POA: Diagnosis not present

## 2024-01-17 DIAGNOSIS — R350 Frequency of micturition: Secondary | ICD-10-CM | POA: Diagnosis not present

## 2024-01-17 DIAGNOSIS — Z9189 Other specified personal risk factors, not elsewhere classified: Secondary | ICD-10-CM | POA: Diagnosis not present

## 2024-01-17 DIAGNOSIS — A609 Anogenital herpesviral infection, unspecified: Secondary | ICD-10-CM | POA: Diagnosis not present

## 2024-01-17 DIAGNOSIS — Z1231 Encounter for screening mammogram for malignant neoplasm of breast: Secondary | ICD-10-CM

## 2024-01-17 LAB — URINALYSIS
Bilirubin, UA: NEGATIVE
Glucose, UA: NEGATIVE
Ketones, UA: NEGATIVE
Nitrite, UA: NEGATIVE
Protein,UA: NEGATIVE
RBC, UA: NEGATIVE
Specific Gravity, UA: 1.02 (ref 1.005–1.030)
Urobilinogen, Ur: 0.2 mg/dL (ref 0.2–1.0)
pH, UA: 7 (ref 5.0–7.5)

## 2024-01-17 LAB — BAYER DCA HB A1C WAIVED: HB A1C (BAYER DCA - WAIVED): 4.8 % (ref 4.8–5.6)

## 2024-01-17 MED ORDER — LITHIUM CARBONATE 300 MG PO CAPS: ORAL_CAPSULE | ORAL | 1 refills | Status: AC

## 2024-01-17 NOTE — Progress Notes (Signed)
 "  Subjective: CC: 63-month lithium  follow-up PCP: Jolinda Norene HERO, DO YEP:Izwpdzj B Back is a 69 y.o. female presenting to clinic today for:  Patient here for interval follow-up on lithium  management.  She is compliant with medications and reports stability of mood.  She does report some increased urinary frequency that is been ongoing since November but denies any dysuria, hematuria, urgency, flank pain etc.  She does report some dry mouth has also been going on for about 6 weeks.  No reports insubstantial changes in vision but is undergoing cataract surgery soon.  No chest pain, shortness of breath, heart palpitations etc. she is compliant with PPI.  She does report occasional globus sensation in her throat, like she needs to clear her throat but there is nothing to clear.     ROS: Per HPI  Allergies[1] Past Medical History:  Diagnosis Date   Allergy    Pennacellin   Atypical mole 06/24/1997   center back-moderate   Atypical mole 06/18/2005   left arm,right upper back-slight   Atypical mole 06/18/2005   lower mid back-moderate tx-widershave   Atypical mole 06/17/2006   right lower abdomen-features of a deep nevus   Atypical mole 06/25/2016   left thigh crease-mild   Atypical mole 06/25/2016   mid chest-moderate   Atypical mole 11/28/2017   left neck, right chest-moderate   Benign neoplasm of colon 04/2008   tubular adenoma   Bipolar I disorder, most recent episode (or current) depressed    Depression 1978   None   Headache(784.0)    Other and unspecified hyperlipidemia    Other atopic dermatitis and related conditions    Squamous cell carcinoma of skin 12/14/2016   mid left back   STD (sexually transmitted disease) 1989   HSV   Current Medications[2] Social History   Socioeconomic History   Marital status: Widowed    Spouse name: Not on file   Number of children: 2   Years of education: Not on file   Highest education level: Associate degree: occupational,  scientist, product/process development, or vocational program  Occupational History   Occupation: GEOLOGIST, ENGINEERING    Employer: [employer]  Tobacco Use   Smoking status: Never   Smokeless tobacco: Never  Vaping Use   Vaping status: Never Used  Substance and Sexual Activity   Alcohol use: No    Comment: social   Drug use: No   Sexual activity: Not Currently    Partners: Male    Birth control/protection: Post-menopausal  Other Topics Concern   Not on file  Social History Narrative   Patient is a retired geophysicist/field seismologist for [employer], retired 2019.   She has a granddaughter, whom she cares for during the week.   Social Drivers of Health   Tobacco Use: Low Risk (12/10/2023)   Patient History    Smoking Tobacco Use: Never    Smokeless Tobacco Use: Never    Passive Exposure: Not on file  Financial Resource Strain: Low Risk (12/10/2023)   Overall Financial Resource Strain (CARDIA)    Difficulty of Paying Living Expenses: Not hard at all  Food Insecurity: No Food Insecurity (12/10/2023)   Epic    Worried About Programme Researcher, Broadcasting/film/video in the Last Year: Never true    Ran Out of Food in the Last Year: Never true  Transportation Needs: No Transportation Needs (12/10/2023)   Epic    Lack of Transportation (Medical): No    Lack of Transportation (Non-Medical): No  Physical Activity:  Insufficiently Active (12/10/2023)   Exercise Vital Sign    Days of Exercise per Week: 2 days    Minutes of Exercise per Session: 20 min  Stress: No Stress Concern Present (12/10/2023)   [employer] of Occupational Health - Occupational Stress Questionnaire    Feeling of Stress: Not at all  Social Connections: Moderately Isolated (12/10/2023)   Social Connection and Isolation Panel    Frequency of Communication with Friends and Family: More than three times a week    Frequency of Social Gatherings with Friends and Family: More than three times a week    Attends Religious Services: 1 to 4 times per year     Active Member of [employer] or Organizations: No    Attends Banker Meetings: Not on file    Marital Status: Widowed  Intimate Partner Violence: Not At Risk (09/30/2023)   Epic    Fear of Current or Ex-Partner: No    Emotionally Abused: No    Physically Abused: No    Sexually Abused: No  Depression (PHQ2-9): Low Risk (12/10/2023)   Depression (PHQ2-9)    PHQ-2 Score: 0  Alcohol Screen: Low Risk (12/10/2023)   Alcohol Screen    Last Alcohol Screening Score (AUDIT): 1  Housing: Low Risk (12/10/2023)   Epic    Unable to Pay for Housing in the Last Year: No    Number of Times Moved in the Last Year: 0    Homeless in the Last Year: No  Utilities: Not At Risk (09/30/2023)   Epic    Threatened with loss of utilities: No  Health Literacy: Adequate Health Literacy (09/30/2023)   B1300 Health Literacy    Frequency of need for help with medical instructions: Never   Family History  Problem Relation Age of Onset   Hearing loss Mother    Thyroid  disease Mother    Osteoporosis Mother    Heart disease Father    Hypertension Father    Failure to thrive Father        ultimate cause of death after several hospitalizations   Colon cancer Maternal Grandmother 69   Breast cancer Neg Hx     Objective: Office vital signs reviewed. BP 125/74   Pulse (!) 57   Temp (!) 97.5 F (36.4 C)   Ht 5' 1 (1.549 m)   Wt 144 lb (65.3 kg)   LMP 03/16/2010   SpO2 99%   BMI 27.21 kg/m   Physical Examination:  General: Awake, alert, well nourished, No acute distress HEENT: Sclera white.  Mucous membranes moist.  No oropharyngeal lesions appreciated.  No thyromegaly or palpable thyroid  nodules appreciated externally. Cardio: Slightly bradycardic with regular, S1S2 heard, no murmurs appreciated Pulm: clear to auscultation bilaterally, no wheezes, rhonchi or rales; normal work of breathing on room air Psych: Mood stable, speech normal, affect appropriate.  Pleasant, interactive     01/17/2024     9:01 AM 12/10/2023   11:25 AM 09/30/2023    2:39 PM  Depression screen PHQ 2/9  Decreased Interest 0 0 0  Down, Depressed, Hopeless 0 0 0  PHQ - 2 Score 0 0 0  Altered sleeping 0    Tired, decreased energy 0    Change in appetite 0    Feeling bad or failure about yourself  0    Trouble concentrating 0    Moving slowly or fidgety/restless 0    Suicidal thoughts 0    PHQ-9 Score 0    Difficult doing work/chores  Not difficult at all        01/17/2024    9:01 AM 07/26/2023    7:56 AM 02/01/2023    8:25 AM 07/25/2022    7:58 AM  GAD 7 : Generalized Anxiety Score  Nervous, Anxious, on Edge 0 0 0 0  Control/stop worrying 0 0 0 0  Worry too much - different things 0 0 0 0  Trouble relaxing 0 0 0 0  Restless 0 0 0 0  Easily annoyed or irritable 0 0 0 0  Afraid - awful might happen 0 0 0 0  Total GAD 7 Score 0 0 0 0  Anxiety Difficulty Not difficult at all Not difficult at all  Not difficult at all   Assessment/ Plan: 69 y.o. female   Bipolar I disorder, most recent episode (or current) depressed (HCC) - Plan: CMP14+EGFR, PTH, Intact and Calcium, Urinalysis, TSH, Lithium  level, EKG 12-Lead, lithium  carbonate 300 MG capsule  Encounter for lithium  monitoring - Plan: CMP14+EGFR, PTH, Intact and Calcium, Urinalysis, TSH, Lithium  level, EKG 12-Lead  Frequent urination - Plan: Bayer DCA Hb A1c Waived  Dry mouth - Plan: Bayer DCA Hb A1c Waived   I personally reviewed her EKG which demonstrated no QTc prolongation or other concerns for arrhythmia.  Will check routine labs for lithium  monitoring.  Add A1c given reports of dry mouth and frequent urination.  Medication renewed.  Follow-up in 6 months for annual physical with fasting labs   Lauren Dejonge CHRISTELLA Fielding, DO [facility] [phone]     [1]  Allergies Allergen Reactions   Penicillins Rash  [2]  Current Outpatient Medications:    Calcium Citrate-Vitamin D  (CALCIUM + D PO), Take 1 tablet by mouth daily.,  Disp: , Rfl:    Ferrous Sulfate (IRON PO), Take 1 tablet by mouth every other day., Disp: , Rfl:    lithium  carbonate 300 MG capsule, TAKE ONE CAPSULE THREE TIMES DAILY WITH MEALS, Disp: 270 capsule, Rfl: 1   omeprazole  (PRILOSEC) 20 MG capsule, Take 1 capsule (20 mg total) by mouth daily., Disp: 90 capsule, Rfl: 3   valACYclovir  (VALTREX ) 500 MG tablet, TAKE ONE TABLET DAILY. (TWICE DAILY X3 DAYS FOR OUTBREAKS), Disp: 90 tablet, Rfl: 3  "

## 2024-01-18 LAB — CMP14+EGFR
ALT: 15 IU/L (ref 0–32)
AST: 13 IU/L (ref 0–40)
Albumin: 4.2 g/dL (ref 3.9–4.9)
Alkaline Phosphatase: 64 IU/L (ref 49–135)
BUN/Creatinine Ratio: 13 (ref 12–28)
BUN: 10 mg/dL (ref 8–27)
Bilirubin Total: 0.7 mg/dL (ref 0.0–1.2)
CO2: 22 mmol/L (ref 20–29)
Calcium: 10.3 mg/dL (ref 8.7–10.3)
Chloride: 107 mmol/L — ABNORMAL HIGH (ref 96–106)
Creatinine, Ser: 0.75 mg/dL (ref 0.57–1.00)
Globulin, Total: 2.4 g/dL (ref 1.5–4.5)
Glucose: 84 mg/dL (ref 70–99)
Potassium: 4.4 mmol/L (ref 3.5–5.2)
Sodium: 141 mmol/L (ref 134–144)
Total Protein: 6.6 g/dL (ref 6.0–8.5)
eGFR: 88 mL/min/1.73

## 2024-01-18 LAB — TSH: TSH: 1.01 u[IU]/mL (ref 0.450–4.500)

## 2024-01-18 LAB — LITHIUM LEVEL: Lithium Lvl: 0.9 mmol/L (ref 0.5–1.2)

## 2024-01-18 LAB — PTH, INTACT AND CALCIUM: PTH: 42 pg/mL (ref 15–65)

## 2024-01-24 ENCOUNTER — Ambulatory Visit: Payer: Self-pay | Admitting: Nurse Practitioner

## 2024-01-24 DIAGNOSIS — R87610 Atypical squamous cells of undetermined significance on cytologic smear of cervix (ASC-US): Secondary | ICD-10-CM

## 2024-01-24 LAB — CYTOLOGY - PAP
Comment: NEGATIVE
Diagnosis: UNDETERMINED — AB
High risk HPV: POSITIVE — AB

## 2024-02-06 ENCOUNTER — Telehealth: Payer: Self-pay

## 2024-02-06 DIAGNOSIS — N3281 Overactive bladder: Secondary | ICD-10-CM

## 2024-02-06 NOTE — Telephone Encounter (Signed)
 Please review and advise.

## 2024-02-06 NOTE — Telephone Encounter (Unsigned)
 Copied from CRM #[id number]. Topic: Clinical - Medication Question >> Feb 06, 2024 10:26 AM [name] wrote: Reason for CRM: Patient called because she spoke with the provider and they stated they could send her some medicine in to the pharmacy for overactive bladder and she is wanting that filled and sent to   [facility] [place], [place] - [address] [place] [address] Phone: [phone] Fax: [phone] Hours: Not open 24 hours  Callback number is [phone].

## 2024-02-07 MED ORDER — OXYBUTYNIN CHLORIDE ER 5 MG PO TB24: 5.0000 mg | ORAL_TABLET | Freq: Every day | ORAL | 3 refills | Status: AC

## 2024-02-07 NOTE — Telephone Encounter (Signed)
 sent

## 2024-02-07 NOTE — Telephone Encounter (Signed)
 Patient notified

## 2024-03-06 ENCOUNTER — Encounter: Admitting: Nurse Practitioner

## 2024-07-24 ENCOUNTER — Encounter: Admitting: Family Medicine

## 2024-09-30 ENCOUNTER — Ambulatory Visit: Payer: Self-pay

## 2024-11-27 ENCOUNTER — Encounter (INDEPENDENT_AMBULATORY_CARE_PROVIDER_SITE_OTHER): Admitting: Ophthalmology

## 2025-01-25 ENCOUNTER — Encounter: Admitting: Nurse Practitioner
